# Patient Record
Sex: Female | Born: 1985
Health system: Southern US, Community
[De-identification: ages and names within clinical notes are randomized; demographics above are authoritative.]

## PROBLEM LIST (undated history)

## (undated) DIAGNOSIS — Z91013 Allergy to seafood: Secondary | ICD-10-CM

## (undated) DIAGNOSIS — E669 Obesity, unspecified: Secondary | ICD-10-CM

## (undated) DIAGNOSIS — M25472 Effusion, left ankle: Secondary | ICD-10-CM

## (undated) DIAGNOSIS — F419 Anxiety disorder, unspecified: Secondary | ICD-10-CM

## (undated) DIAGNOSIS — K760 Fatty (change of) liver, not elsewhere classified: Secondary | ICD-10-CM

## (undated) DIAGNOSIS — M25471 Effusion, right ankle: Secondary | ICD-10-CM

## (undated) DIAGNOSIS — R7303 Prediabetes: Secondary | ICD-10-CM

## (undated) DIAGNOSIS — R079 Chest pain, unspecified: Secondary | ICD-10-CM

## (undated) DIAGNOSIS — M2550 Pain in unspecified joint: Secondary | ICD-10-CM

## (undated) DIAGNOSIS — K828 Other specified diseases of gallbladder: Secondary | ICD-10-CM

## (undated) DIAGNOSIS — K829 Disease of gallbladder, unspecified: Secondary | ICD-10-CM

## (undated) DIAGNOSIS — E559 Vitamin D deficiency, unspecified: Secondary | ICD-10-CM

## (undated) DIAGNOSIS — M25475 Effusion, left foot: Secondary | ICD-10-CM

## (undated) DIAGNOSIS — M25474 Effusion, right foot: Secondary | ICD-10-CM

## (undated) DIAGNOSIS — R5383 Other fatigue: Secondary | ICD-10-CM

## (undated) DIAGNOSIS — R0602 Shortness of breath: Secondary | ICD-10-CM

## (undated) DIAGNOSIS — D649 Anemia, unspecified: Secondary | ICD-10-CM

## (undated) DIAGNOSIS — G4730 Sleep apnea, unspecified: Secondary | ICD-10-CM

## (undated) DIAGNOSIS — E785 Hyperlipidemia, unspecified: Secondary | ICD-10-CM

## (undated) DIAGNOSIS — M1990 Unspecified osteoarthritis, unspecified site: Secondary | ICD-10-CM

## (undated) DIAGNOSIS — R1310 Dysphagia, unspecified: Secondary | ICD-10-CM

## (undated) DIAGNOSIS — F32A Depression, unspecified: Secondary | ICD-10-CM

## (undated) HISTORY — DX: Shortness of breath: R06.02

## (undated) HISTORY — DX: Sleep apnea, unspecified: G47.30

## (undated) HISTORY — DX: Effusion, left ankle: M25.472

## (undated) HISTORY — DX: Effusion, left foot: M25.475

## (undated) HISTORY — DX: Anxiety disorder, unspecified: F41.9

## (undated) HISTORY — DX: Allergy to seafood: Z91.013

## (undated) HISTORY — DX: Vitamin D deficiency, unspecified: E55.9

## (undated) HISTORY — DX: Chest pain, unspecified: R07.9

## (undated) HISTORY — DX: Depression, unspecified: F32.A

## (undated) HISTORY — DX: Effusion, right ankle: M25.471

## (undated) HISTORY — DX: Fatty (change of) liver, not elsewhere classified: K76.0

## (undated) HISTORY — DX: Disease of gallbladder, unspecified: K82.9

## (undated) HISTORY — DX: Dysphagia, unspecified: R13.10

## (undated) HISTORY — PX: WISDOM TOOTH EXTRACTION: SHX21

## (undated) HISTORY — DX: Other fatigue: R53.83

## (undated) HISTORY — DX: Anemia, unspecified: D64.9

## (undated) HISTORY — DX: Hyperlipidemia, unspecified: E78.5

## (undated) HISTORY — DX: Pain in unspecified joint: M25.50

## (undated) HISTORY — DX: Prediabetes: R73.03

## (undated) HISTORY — DX: Obesity, unspecified: E66.9

## (undated) HISTORY — DX: Effusion, right foot: M25.474

---
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
## 2007-01-09 ENCOUNTER — Ambulatory Visit: Payer: Self-pay | Admitting: Internal Medicine

## 2007-01-30 ENCOUNTER — Ambulatory Visit: Payer: Self-pay | Admitting: Family Medicine

## 2007-04-09 ENCOUNTER — Ambulatory Visit: Payer: Self-pay | Admitting: Family Medicine

## 2007-06-03 ENCOUNTER — Emergency Department (HOSPITAL_COMMUNITY): Admission: EM | Admit: 2007-06-03 | Discharge: 2007-06-03 | Payer: Self-pay | Admitting: Family Medicine

## 2007-07-15 ENCOUNTER — Ambulatory Visit: Payer: Self-pay | Admitting: Family Medicine

## 2007-09-06 ENCOUNTER — Emergency Department (HOSPITAL_COMMUNITY): Admission: EM | Admit: 2007-09-06 | Discharge: 2007-09-06 | Payer: Self-pay | Admitting: Family Medicine

## 2007-10-19 ENCOUNTER — Emergency Department (HOSPITAL_COMMUNITY): Admission: EM | Admit: 2007-10-19 | Discharge: 2007-10-20 | Payer: Self-pay | Admitting: Emergency Medicine

## 2008-01-23 ENCOUNTER — Emergency Department (HOSPITAL_COMMUNITY): Admission: EM | Admit: 2008-01-23 | Discharge: 2008-01-24 | Payer: Self-pay | Admitting: Emergency Medicine

## 2008-07-03 ENCOUNTER — Ambulatory Visit: Payer: Self-pay | Admitting: Family Medicine

## 2008-07-25 ENCOUNTER — Emergency Department (HOSPITAL_COMMUNITY): Admission: EM | Admit: 2008-07-25 | Discharge: 2008-07-25 | Payer: Self-pay | Admitting: Emergency Medicine

## 2008-08-03 ENCOUNTER — Inpatient Hospital Stay (HOSPITAL_COMMUNITY): Admission: AD | Admit: 2008-08-03 | Discharge: 2008-08-03 | Payer: Self-pay | Admitting: Obstetrics and Gynecology

## 2009-04-02 ENCOUNTER — Ambulatory Visit: Payer: Self-pay | Admitting: Family Medicine

## 2009-04-05 ENCOUNTER — Emergency Department (HOSPITAL_COMMUNITY): Admission: EM | Admit: 2009-04-05 | Discharge: 2009-04-05 | Payer: Self-pay | Admitting: Emergency Medicine

## 2009-08-04 ENCOUNTER — Ambulatory Visit: Payer: Self-pay | Admitting: Family Medicine

## 2009-08-19 ENCOUNTER — Ambulatory Visit: Payer: Self-pay | Admitting: Family Medicine

## 2009-11-01 ENCOUNTER — Ambulatory Visit: Payer: Self-pay | Admitting: Family Medicine

## 2009-11-20 ENCOUNTER — Emergency Department (HOSPITAL_COMMUNITY): Admission: EM | Admit: 2009-11-20 | Discharge: 2009-11-20 | Payer: Self-pay | Admitting: Family Medicine

## 2009-12-12 ENCOUNTER — Emergency Department (HOSPITAL_COMMUNITY): Admission: EM | Admit: 2009-12-12 | Discharge: 2009-12-12 | Payer: Self-pay | Admitting: Family Medicine

## 2009-12-15 ENCOUNTER — Ambulatory Visit: Payer: Self-pay | Admitting: Family Medicine

## 2010-07-10 ENCOUNTER — Encounter: Payer: Self-pay | Admitting: Obstetrics and Gynecology

## 2010-09-22 LAB — RPR: RPR Ser Ql: NONREACTIVE

## 2010-09-22 LAB — POCT URINALYSIS DIP (DEVICE)
Bilirubin Urine: NEGATIVE
Glucose, UA: NEGATIVE mg/dL
Specific Gravity, Urine: 1.025 (ref 1.005–1.030)
Urobilinogen, UA: 0.2 mg/dL (ref 0.0–1.0)

## 2010-09-22 LAB — POCT PREGNANCY, URINE: Preg Test, Ur: NEGATIVE

## 2010-09-22 LAB — GC/CHLAMYDIA PROBE AMP, GENITAL: Chlamydia, DNA Probe: NEGATIVE

## 2010-09-22 LAB — HIV ANTIBODY (ROUTINE TESTING W REFLEX): HIV: NONREACTIVE

## 2010-09-22 LAB — WET PREP, GENITAL: Clue Cells Wet Prep HPF POC: NONE SEEN

## 2010-09-26 ENCOUNTER — Ambulatory Visit (INDEPENDENT_AMBULATORY_CARE_PROVIDER_SITE_OTHER): Payer: Managed Care, Other (non HMO) | Admitting: Family Medicine

## 2010-09-26 DIAGNOSIS — Z3201 Encounter for pregnancy test, result positive: Secondary | ICD-10-CM

## 2010-10-04 LAB — URINALYSIS, ROUTINE W REFLEX MICROSCOPIC
Bilirubin Urine: NEGATIVE
Glucose, UA: NEGATIVE mg/dL
Hgb urine dipstick: NEGATIVE
Ketones, ur: NEGATIVE mg/dL
Nitrite: NEGATIVE
Protein, ur: NEGATIVE mg/dL
pH: 6 (ref 5.0–8.0)

## 2010-10-04 LAB — WET PREP, GENITAL: Trich, Wet Prep: NONE SEEN

## 2010-10-04 LAB — POCT URINALYSIS DIP (DEVICE)
Bilirubin Urine: NEGATIVE
Glucose, UA: NEGATIVE mg/dL
Ketones, ur: NEGATIVE mg/dL
Specific Gravity, Urine: 1.02 (ref 1.005–1.030)

## 2010-10-04 LAB — POCT PREGNANCY, URINE
Preg Test, Ur: NEGATIVE
Preg Test, Ur: NEGATIVE

## 2010-10-12 ENCOUNTER — Inpatient Hospital Stay (HOSPITAL_COMMUNITY)
Admission: AD | Admit: 2010-10-12 | Discharge: 2010-10-12 | Disposition: A | Discharge status: Home / Self Care | Payer: Managed Care, Other (non HMO) | Source: Ambulatory Visit | Attending: Obstetrics and Gynecology | Admitting: Obstetrics and Gynecology

## 2010-10-12 ENCOUNTER — Inpatient Hospital Stay (HOSPITAL_COMMUNITY): Payer: Managed Care, Other (non HMO)

## 2010-10-12 DIAGNOSIS — O99891 Other specified diseases and conditions complicating pregnancy: Secondary | ICD-10-CM | POA: Insufficient documentation

## 2010-10-12 DIAGNOSIS — O9989 Other specified diseases and conditions complicating pregnancy, childbirth and the puerperium: Secondary | ICD-10-CM

## 2010-10-12 DIAGNOSIS — R42 Dizziness and giddiness: Secondary | ICD-10-CM

## 2010-10-12 LAB — CBC
HCT: 36.1 % (ref 36.0–46.0)
Hemoglobin: 12 g/dL (ref 12.0–15.0)
MCHC: 33.2 g/dL (ref 30.0–36.0)
MCV: 84.3 fL (ref 78.0–100.0)
Platelets: 262 10*3/uL (ref 150–400)
RBC: 4.28 MIL/uL (ref 3.87–5.11)
RDW: 13.4 % (ref 11.5–15.5)
WBC: 10.6 10*3/uL — ABNORMAL HIGH (ref 4.0–10.5)

## 2010-10-12 LAB — COMPREHENSIVE METABOLIC PANEL
Albumin: 3.7 g/dL (ref 3.5–5.2)
Alkaline Phosphatase: 42 U/L (ref 39–117)
CO2: 22 mEq/L (ref 19–32)
Chloride: 105 mEq/L (ref 96–112)
GFR calc Af Amer: >60 mL/min (ref >60)
GFR calc non Af Amer: >60 mL/min (ref >60)
Glucose, Bld: 91 mg/dL (ref 70–99)
Potassium: 3.9 mEq/L (ref 3.5–5.1)
Sodium: 135 mEq/L (ref 135–145)
Total Protein: 6.7 g/dL (ref 6.0–8.3)

## 2010-10-12 LAB — HCG, QUANTITATIVE, PREGNANCY: hCG, Beta Chain, Quant, S: 55464 m[IU]/mL — ABNORMAL HIGH (ref <5)

## 2010-10-12 LAB — ABO/RH: ABO/RH(D): O POS

## 2010-10-12 LAB — URINALYSIS, ROUTINE W REFLEX MICROSCOPIC: Glucose, UA: NEGATIVE mg/dL

## 2010-10-13 ENCOUNTER — Ambulatory Visit: Payer: Managed Care, Other (non HMO) | Admitting: Family Medicine

## 2010-10-14 ENCOUNTER — Ambulatory Visit: Payer: Managed Care, Other (non HMO) | Admitting: Medical

## 2010-10-14 LAB — RPR: RPR: NONREACTIVE

## 2010-10-14 LAB — GC/CHLAMYDIA PROBE AMP, GENITAL: Chlamydia: NEGATIVE

## 2010-10-14 LAB — RUBELLA ANTIBODY, IGM: Rubella: IMMUNE

## 2010-10-14 LAB — HEPATITIS B SURFACE ANTIGEN: Hepatitis B Surface Ag: NEGATIVE

## 2010-11-01 ENCOUNTER — Ambulatory Visit: Payer: Managed Care, Other (non HMO) | Admitting: Family Medicine

## 2010-11-02 ENCOUNTER — Ambulatory Visit: Payer: Managed Care, Other (non HMO) | Admitting: Family Medicine

## 2011-03-24 LAB — WET PREP, GENITAL
Trich, Wet Prep: NONE SEEN
WBC, Wet Prep HPF POC: NONE SEEN

## 2011-03-24 LAB — GC/CHLAMYDIA PROBE AMP, GENITAL: Chlamydia, DNA Probe: NEGATIVE

## 2011-03-24 LAB — POCT URINALYSIS DIP (DEVICE)
Bilirubin Urine: NEGATIVE
Glucose, UA: NEGATIVE
Hgb urine dipstick: NEGATIVE
Specific Gravity, Urine: 1.025
Urobilinogen, UA: 0.2
pH: 6

## 2011-03-24 LAB — POCT PREGNANCY, URINE: Preg Test, Ur: NEGATIVE

## 2011-03-24 LAB — RPR: RPR Ser Ql: NONREACTIVE

## 2011-03-26 ENCOUNTER — Encounter (HOSPITAL_COMMUNITY): Payer: Self-pay

## 2011-03-26 ENCOUNTER — Inpatient Hospital Stay (HOSPITAL_COMMUNITY)
Admission: AD | Admit: 2011-03-26 | Discharge: 2011-03-26 | Disposition: A | Discharge status: Home / Self Care | Payer: Managed Care, Other (non HMO) | Source: Ambulatory Visit | Attending: Obstetrics and Gynecology | Admitting: Obstetrics and Gynecology

## 2011-03-26 DIAGNOSIS — O99891 Other specified diseases and conditions complicating pregnancy: Secondary | ICD-10-CM | POA: Insufficient documentation

## 2011-03-26 DIAGNOSIS — R109 Unspecified abdominal pain: Secondary | ICD-10-CM | POA: Insufficient documentation

## 2011-03-26 DIAGNOSIS — Z3480 Encounter for supervision of other normal pregnancy, unspecified trimester: Secondary | ICD-10-CM

## 2011-03-26 DIAGNOSIS — Z3490 Encounter for supervision of normal pregnancy, unspecified, unspecified trimester: Secondary | ICD-10-CM

## 2011-03-26 LAB — URINALYSIS, ROUTINE W REFLEX MICROSCOPIC
Bilirubin Urine: NEGATIVE
Hgb urine dipstick: NEGATIVE
Nitrite: NEGATIVE
Protein, ur: NEGATIVE mg/dL
Urobilinogen, UA: 0.2 mg/dL (ref 0.0–1.0)

## 2011-03-26 NOTE — Progress Notes (Signed)
Having cramping since this morning, a lot of fetal movement, sneezed had water going down, looked clear.

## 2011-03-26 NOTE — ED Provider Notes (Signed)
History     Chief Complaint  Patient presents with  . Abdominal Cramping  . Vaginal Discharge   HPI G1P0 at [redacted]w[redacted]d reports small gush of clear fluid 23 earlier today when sneezing, no leaking since. Cramping earlier this morning, no pains or cramping now. + fetal movement.  Normal prenatal course.   OB History    Grav Para Term Preterm Abortions TAB SAB Ect Mult Living   1               Past Medical History  Diagnosis Date  . No pertinent past medical history     Past Surgical History  Procedure Date  . No past surgeries     No family history on file.  History  Substance Use Topics  . Smoking status: Never Smoker   . Smokeless tobacco: Not on file  . Alcohol Use: No    Allergies: Allergies not on file  No prescriptions prior to admission    Review of Systems  Constitutional: Negative.   Respiratory: Negative.   Cardiovascular: Negative.   Gastrointestinal: Negative for nausea, vomiting, abdominal pain, diarrhea and constipation.  Genitourinary: Negative for dysuria, urgency, frequency, hematuria and flank pain.       Negative for vaginal bleeding, cramping/contractions  Musculoskeletal: Negative.   Neurological: Negative.   Psychiatric/Behavioral: Negative.    Physical Exam   Blood pressure 131/73, pulse 120, temperature 98.9 F (37.2 C), temperature source Oral, resp. rate 18, height 5' 7.5" (1.715 m), weight 111.403 kg (245 lb 9.6 oz).  Physical Exam  Nursing note and vitals reviewed. Constitutional: She is oriented to person, place, and time. She appears well-developed and well-nourished. No distress.  Cardiovascular: Tachycardia present.   Respiratory: Effort normal.  GI: Soft. There is no tenderness.  Genitourinary: Vaginal discharge (white) found.       Cervix appears thick and closed   Musculoskeletal: Normal range of motion.  Neurological: She is oriented to person, place, and time.  Skin: Skin is warm and dry.  Psychiatric: She has a normal  mood and affect.   EFM: Baseline:140 Variability:mod Accels:present Decels:absent  Toco:quiet   MAU Course  Procedures  Results for orders placed during the hospital encounter of 03/26/11 (from the past 24 hour(s))  URINALYSIS, ROUTINE W REFLEX MICROSCOPIC     Status: Normal   Collection Time   03/26/11  3:00 PM      Component Value Range   Color, Urine YELLOW  YELLOW    Appearance CLEAR  CLEAR    Specific Gravity, Urine 1.020  1.005 - 1.030    pH 6.5  5.0 - 8.0    Glucose, UA NEGATIVE  NEGATIVE (mg/dL)   Hgb urine dipstick NEGATIVE  NEGATIVE    Bilirubin Urine NEGATIVE  NEGATIVE    Ketones, ur NEGATIVE  NEGATIVE (mg/dL)   Protein, ur NEGATIVE  NEGATIVE (mg/dL)   Urobilinogen, UA 0.2  0.0 - 1.0 (mg/dL)   Nitrite NEGATIVE  NEGATIVE    Leukocytes, UA NEGATIVE  NEGATIVE     Fern negative  Assessment and Plan  25 y.o. G1P0 at [redacted]w[redacted]d Intact membranes, reactive tracing Reported to Dr. Jackelyn Knife by RN, ok to D/C home with precautions Follow up as scheduled  FRAZIER,NATALIE 03/26/2011, 3:30 PM

## 2011-03-27 NOTE — Progress Notes (Signed)
FHT from 10-7 reviewed, reactive NST, no decels, rare ctx.

## 2011-04-27 LAB — STREP B DNA PROBE: GBS: NEGATIVE

## 2011-05-17 ENCOUNTER — Encounter (HOSPITAL_COMMUNITY): Payer: Self-pay | Admitting: Anesthesiology

## 2011-05-17 ENCOUNTER — Inpatient Hospital Stay (HOSPITAL_COMMUNITY): Payer: Managed Care, Other (non HMO) | Admitting: Anesthesiology

## 2011-05-17 ENCOUNTER — Inpatient Hospital Stay (HOSPITAL_COMMUNITY)
Admission: AD | Admit: 2011-05-17 | Discharge: 2011-05-20 | DRG: 775 | Disposition: A | Discharge status: Home / Self Care | Payer: Managed Care, Other (non HMO) | Source: Ambulatory Visit | Attending: Obstetrics and Gynecology | Admitting: Obstetrics and Gynecology

## 2011-05-17 ENCOUNTER — Encounter (HOSPITAL_COMMUNITY): Payer: Self-pay

## 2011-05-17 DIAGNOSIS — O4290 Premature rupture of membranes, unspecified as to length of time between rupture and onset of labor, unspecified weeks of gestation: Secondary | ICD-10-CM | POA: Diagnosis present

## 2011-05-17 LAB — CBC
MCH: 28.5 pg (ref 26.0–34.0)
MCHC: 32.7 g/dL (ref 30.0–36.0)
Platelets: 203 10*3/uL (ref 150–400)
RBC: 3.83 MIL/uL — ABNORMAL LOW (ref 3.87–5.11)

## 2011-05-17 MED ORDER — BUTORPHANOL TARTRATE 2 MG/ML IJ SOLN
1.0000 mg | Freq: Once | INTRAMUSCULAR | Status: AC
  Administered 2011-05-17: 1 mg via INTRAVENOUS
  Filled 2011-05-17: qty 1

## 2011-05-17 MED ORDER — SODIUM BICARBONATE 8.4 % IV SOLN
INTRAVENOUS | Status: DC | PRN
  Administered 2011-05-17: 5 mL via EPIDURAL

## 2011-05-17 MED ORDER — CITRIC ACID-SODIUM CITRATE 334-500 MG/5ML PO SOLN: 30.0000 mL | ORAL | Status: DC | PRN

## 2011-05-17 MED ORDER — OXYTOCIN BOLUS FROM INFUSION
500.0000 mL | Freq: Once | INTRAVENOUS | Status: DC
Start: 2011-05-17 — End: 2011-05-20
  Filled 2011-05-17: qty 500

## 2011-05-17 MED ORDER — LIDOCAINE HCL (PF) 1 % IJ SOLN
30.0000 mL | INTRAMUSCULAR | Status: DC | PRN
  Administered 2011-05-18: 30 mL via SUBCUTANEOUS
  Filled 2011-05-17: qty 30

## 2011-05-17 MED ORDER — FENTANYL 2.5 MCG/ML BUPIVACAINE 1/10 % EPIDURAL INFUSION (WH - ANES)
14.0000 mL/h | INTRAMUSCULAR | Status: DC
  Administered 2011-05-17: 14 mL/h via EPIDURAL
  Filled 2011-05-17 (×2): qty 60

## 2011-05-17 MED ORDER — LACTATED RINGERS IV SOLN
500.0000 mL | INTRAVENOUS | Status: DC | PRN
  Administered 2011-05-17: 500 mL via INTRAVENOUS

## 2011-05-17 MED ORDER — OXYCODONE-ACETAMINOPHEN 5-325 MG PO TABS: 2.0000 | ORAL_TABLET | ORAL | Status: DC | PRN

## 2011-05-17 MED ORDER — FLEET ENEMA 7-19 GM/118ML RE ENEM: 1.0000 | ENEMA | RECTAL | Status: DC | PRN

## 2011-05-17 MED ORDER — ONDANSETRON HCL 4 MG/2ML IJ SOLN: 4.0000 mg | Freq: Four times a day (QID) | INTRAMUSCULAR | Status: DC | PRN

## 2011-05-17 MED ORDER — PHENYLEPHRINE 40 MCG/ML (10ML) SYRINGE FOR IV PUSH (FOR BLOOD PRESSURE SUPPORT)
80.0000 ug | PREFILLED_SYRINGE | INTRAVENOUS | Status: DC | PRN
  Filled 2011-05-17: qty 5

## 2011-05-17 MED ORDER — FENTANYL 2.5 MCG/ML BUPIVACAINE 1/10 % EPIDURAL INFUSION (WH - ANES)
INTRAMUSCULAR | Status: DC | PRN
  Administered 2011-05-17: 14 mL/h via EPIDURAL

## 2011-05-17 MED ORDER — IBUPROFEN 600 MG PO TABS
600.0000 mg | ORAL_TABLET | Freq: Four times a day (QID) | ORAL | Status: DC | PRN
  Administered 2011-05-18: 600 mg via ORAL
  Filled 2011-05-17: qty 1

## 2011-05-17 MED ORDER — OXYTOCIN 20 UNITS IN LACTATED RINGERS INFUSION - SIMPLE
125.0000 mL/h | Freq: Once | INTRAVENOUS | Status: AC
  Administered 2011-05-17: 125 mL/h via INTRAVENOUS
  Administered 2011-05-17: 3 mL/h via INTRAVENOUS
  Filled 2011-05-17 (×2): qty 1000

## 2011-05-17 MED ORDER — EPHEDRINE 5 MG/ML INJ
10.0000 mg | INTRAVENOUS | Status: DC | PRN
  Filled 2011-05-17: qty 4

## 2011-05-17 MED ORDER — EPHEDRINE 5 MG/ML INJ: 10.0000 mg | INTRAVENOUS | Status: DC | PRN

## 2011-05-17 MED ORDER — OXYTOCIN 20 UNITS IN LACTATED RINGERS INFUSION - SIMPLE: 1.0000 m[IU]/min | INTRAVENOUS | Status: DC

## 2011-05-17 MED ORDER — LACTATED RINGERS IV SOLN
INTRAVENOUS | Status: DC
  Administered 2011-05-17: 16:00:00 via INTRAVENOUS

## 2011-05-17 MED ORDER — ACETAMINOPHEN 325 MG PO TABS: 650.0000 mg | ORAL_TABLET | ORAL | Status: DC | PRN

## 2011-05-17 MED ORDER — DIPHENHYDRAMINE HCL 50 MG/ML IJ SOLN: 12.5000 mg | INTRAMUSCULAR | Status: DC | PRN

## 2011-05-17 MED ORDER — PHENYLEPHRINE 40 MCG/ML (10ML) SYRINGE FOR IV PUSH (FOR BLOOD PRESSURE SUPPORT): 80.0000 ug | PREFILLED_SYRINGE | INTRAVENOUS | Status: DC | PRN

## 2011-05-17 MED ORDER — LACTATED RINGERS IV SOLN
500.0000 mL | Freq: Once | INTRAVENOUS | Status: AC
  Administered 2011-05-17: 500 mL via INTRAVENOUS

## 2011-05-17 NOTE — Anesthesia Preprocedure Evaluation (Signed)
Anesthesia Evaluation  Patient identified by MRN, date of birth, ID band Patient awake    Reviewed: Allergy & Precautions, H&P , Patient's Chart, lab work & pertinent test results  Airway Mallampati: II TM Distance: >3 FB Neck ROM: full    Dental  (+) Teeth Intact   Pulmonary  clear to auscultation        Cardiovascular regular Normal    Neuro/Psych    GI/Hepatic   Endo/Other  Morbid obesity  Renal/GU      Musculoskeletal   Abdominal   Peds  Hematology   Anesthesia Other Findings       Reproductive/Obstetrics (+) Pregnancy                          Anesthesia Physical Anesthesia Plan  ASA: III  Anesthesia Plan: Epidural   Post-op Pain Management:    Induction:   Airway Management Planned:   Additional Equipment:   Intra-op Plan:   Post-operative Plan:   Informed Consent:   Plan Discussed with:   Anesthesia Plan Comments:         Anesthesia Quick Evaluation  

## 2011-05-17 NOTE — Progress Notes (Signed)
Patient states she started leaking clear fluid at 1100 this am. Was seen in the office and confirmed SROM and sent to [facility]. Patient states contractions every 15 minutes and has not felt fetal movement today. Fetal heart tones in triage in the 120's.

## 2011-05-17 NOTE — Anesthesia Procedure Notes (Signed)
Epidural Patient location during procedure: OB  Preanesthetic Checklist Completed: patient identified, site marked, surgical consent, pre-op evaluation, timeout performed, IV checked, risks and benefits discussed and monitors and equipment checked  Epidural Patient position: sitting Prep: site prepped and draped and DuraPrep Patient monitoring: continuous pulse ox and blood pressure Approach: midline Injection technique: LOR air  Needle:  Needle type: Tuohy  Needle gauge: 17 G Needle length: 9 cm Needle insertion depth: 7 cm Catheter type: closed end flexible Catheter size: 19 Gauge Catheter at skin depth: 14 cm Test dose: negative  Assessment Events: blood not aspirated, injection not painful, no injection resistance, negative IV test and no paresthesia  Additional Notes Dosing of Epidural:  1st dose, through needle ............................................. epi 1:200K + Xylocaine 40 mg  2nd dose, through catheter, after waiting 3 minutes.....epi 1:200K + Xylocaine 60 mg  3rd dose, through catheter after waiting 3 minutes .............................Marcaine   5mg   ( mg Marcaine are expressed as equivilent  cc's medication removed from the 0.1%Bupiv / fentanyl syringe from L&D pump)  ( 2% Xylo charted as a single dose in Epic Meds for ease of charting; actual dosing was fractionated as above, for saftey's sake)  As each dose occurred, patient was free of IV sx; and patient exhibited no evidence of SA injection.  Patient is more comfortable after epidural dosed. Please see RN's note for documentation of vital signs,and FHR which are stable.       
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
## 2011-05-17 NOTE — Progress Notes (Signed)
Patient ID: Deborah Little, female   DOB: Aug 09, 1985, 25 y.o.   MRN: [id number] Pt has received an epidural and is comfortable. Cervix is 4 cm 100% effaced and the vertex is at -1 station.

## 2011-05-17 NOTE — Progress Notes (Signed)
MD made aware of pt progression: uterine contraction pattern, SVE, FHT tracing and Pitocin settings. Continue current POC. Will continue to monitor.

## 2011-05-17 NOTE — Progress Notes (Addendum)
MD made aware of pt progress: SVE, uterine contraction pattern, FHT and Pitocin settings.

## 2011-05-17 NOTE — Progress Notes (Signed)
MD made aware of pt progress: uterine contraction pattern, FHT, SVE  and Pitocin settings. Continue current POC. Will continue to monitor.

## 2011-05-18 ENCOUNTER — Encounter (HOSPITAL_COMMUNITY): Payer: Self-pay | Admitting: Pediatric Intensive Care

## 2011-05-18 ENCOUNTER — Other Ambulatory Visit: Payer: Self-pay | Admitting: Obstetrics and Gynecology

## 2011-05-18 LAB — CBC
HCT: 30 % — ABNORMAL LOW (ref 36.0–46.0)
Hemoglobin: 9.6 g/dL — ABNORMAL LOW (ref 12.0–15.0)
MCH: 27.8 pg (ref 26.0–34.0)
MCV: 87 fL (ref 78.0–100.0)
RBC: 3.45 MIL/uL — ABNORMAL LOW (ref 3.87–5.11)
WBC: 13.7 10*3/uL — ABNORMAL HIGH (ref 4.0–10.5)

## 2011-05-18 LAB — RPR: RPR Ser Ql: NONREACTIVE

## 2011-05-18 MED ORDER — WITCH HAZEL-GLYCERIN EX PADS
1.0000 "application " | MEDICATED_PAD | CUTANEOUS | Status: DC | PRN
  Administered 2011-05-18: 1 via TOPICAL

## 2011-05-18 MED ORDER — IBUPROFEN 600 MG PO TABS
600.0000 mg | ORAL_TABLET | Freq: Four times a day (QID) | ORAL | Status: DC
  Administered 2011-05-18 – 2011-05-20 (×7): 600 mg via ORAL
  Filled 2011-05-18 (×7): qty 1

## 2011-05-18 MED ORDER — SIMETHICONE 80 MG PO CHEW: 80.0000 mg | CHEWABLE_TABLET | ORAL | Status: DC | PRN

## 2011-05-18 MED ORDER — MENTHOL 3 MG MT LOZG
1.0000 | LOZENGE | OROMUCOSAL | Status: DC | PRN
  Administered 2011-05-18: 3 mg via ORAL
  Filled 2011-05-18 (×4): qty 9

## 2011-05-18 MED ORDER — ONDANSETRON HCL 4 MG/2ML IJ SOLN: 4.0000 mg | INTRAMUSCULAR | Status: DC | PRN

## 2011-05-18 MED ORDER — PRENATAL PLUS 27-1 MG PO TABS
1.0000 | ORAL_TABLET | Freq: Every day | ORAL | Status: DC
  Administered 2011-05-18 – 2011-05-19 (×2): 1 via ORAL
  Filled 2011-05-18 (×3): qty 1

## 2011-05-18 MED ORDER — LANOLIN HYDROUS EX OINT: TOPICAL_OINTMENT | CUTANEOUS | Status: DC | PRN

## 2011-05-18 MED ORDER — TETANUS-DIPHTH-ACELL PERTUSSIS 5-2.5-18.5 LF-MCG/0.5 IM SUSP
0.5000 mL | Freq: Once | INTRAMUSCULAR | Status: AC
  Administered 2011-05-19: 0.5 mL via INTRAMUSCULAR
  Filled 2011-05-18: qty 0.5

## 2011-05-18 MED ORDER — BENZOCAINE-MENTHOL 20-0.5 % EX AERO: 1.0000 "application " | INHALATION_SPRAY | CUTANEOUS | Status: DC | PRN

## 2011-05-18 MED ORDER — DIBUCAINE 1 % RE OINT: 1.0000 "application " | TOPICAL_OINTMENT | RECTAL | Status: DC | PRN

## 2011-05-18 MED ORDER — MEASLES, MUMPS & RUBELLA VAC ~~LOC~~ INJ
0.5000 mL | INJECTION | Freq: Once | SUBCUTANEOUS | Status: DC
  Filled 2011-05-18: qty 0.5

## 2011-05-18 MED ORDER — DIPHENHYDRAMINE HCL 25 MG PO CAPS: 25.0000 mg | ORAL_CAPSULE | Freq: Four times a day (QID) | ORAL | Status: DC | PRN

## 2011-05-18 MED ORDER — ZOLPIDEM TARTRATE 5 MG PO TABS: 5.0000 mg | ORAL_TABLET | Freq: Every evening | ORAL | Status: DC | PRN

## 2011-05-18 MED ORDER — OXYTOCIN 20 UNITS IN LACTATED RINGERS INFUSION - SIMPLE: 125.0000 mL/h | INTRAVENOUS | Status: AC

## 2011-05-18 MED ORDER — SENNOSIDES-DOCUSATE SODIUM 8.6-50 MG PO TABS
2.0000 | ORAL_TABLET | Freq: Every day | ORAL | Status: DC
  Administered 2011-05-18 – 2011-05-19 (×2): 2 via ORAL

## 2011-05-18 MED ORDER — GUAIFENESIN 100 MG/5ML PO SOLN
5.0000 mL | ORAL | Status: DC | PRN
  Administered 2011-05-18: 100 mg via ORAL
  Administered 2011-05-19: 300 mg via ORAL
  Filled 2011-05-18 (×2): qty 15

## 2011-05-18 MED ORDER — ONDANSETRON HCL 4 MG PO TABS: 4.0000 mg | ORAL_TABLET | ORAL | Status: DC | PRN

## 2011-05-18 NOTE — Op Note (Signed)
Pt progressed slowly to full dilatation and pushed well to deliver a living female infant spontaneously ROA over an intact perineum. Weight 7 pounds 7 ounces and Apgars 7 at 1 minute and 9 at 5 minutes. At 1 minute 1 taken off for tone and 2 for color. There was an occult cord prolapse at the time of delivery. The cord was at least 4 feet long and the placenta was removed intact. There were bilateral labial lacerations and a laceration at the introitus at 4 o'clock, all of which were bleeding and sutured with 3-0 vicryl. EBL 400 cc's. During the second stage of labor O2 was given by mask and lateral displacement of the uterus was done to improve bradycardia.

## 2011-05-18 NOTE — Progress Notes (Signed)
Md made aware of pt status: uterine contractions pattern, FHT tracing, SVE and Pitocin setting. Continue current POC. Will continue to monitor.

## 2011-05-18 NOTE — Addendum Note (Signed)
Addendum  created 05/18/11 1010 by Cephus Shelling   Modules edited:Charges VN, Notes Section
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
## 2011-05-18 NOTE — Progress Notes (Signed)
Pt delivered viable female with APGARS 7, 9. Dr Ambrose Mantle present at delivery.

## 2011-05-18 NOTE — Progress Notes (Signed)
MD made aware of pt status: FHT tracing, uterine contraction pattern, SVE and pitocin settings. Will keep current pitocin settings per MD. Will continue to monitor.
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
## 2011-05-18 NOTE — Progress Notes (Signed)
Patient ID: Deborah Little, female   DOB: 1985/09/14, 25 y.o.   MRN: [id number] DOD afebrile doing well

## 2011-05-18 NOTE — Anesthesia Postprocedure Evaluation (Signed)
  Anesthesia Post-op Note  Patient: Deborah Little  Procedure(s) Performed: * No procedures listed *  Patient Location: Mother/Baby  Anesthesia Type: Epidural  Level of Consciousness: alert  and oriented  Airway and Oxygen Therapy: Patient Spontanous Breathing  Post-op Pain: mild  Post-op Assessment: Patient's Cardiovascular Status Stable and Respiratory Function Stable  Post-op Vital Signs: stable  Complications: No apparent anesthesia complications

## 2011-05-18 NOTE — Progress Notes (Signed)
MD made aware of SVE.

## 2011-05-18 NOTE — Progress Notes (Signed)
Pt to room 138 via w/c in stable condition.

## 2011-05-19 MED ORDER — HYDROCODONE-ACETAMINOPHEN 5-325 MG PO TABS
1.0000 | ORAL_TABLET | ORAL | Status: DC | PRN
  Administered 2011-05-19: 1 via ORAL
  Filled 2011-05-19: qty 1

## 2011-05-19 NOTE — Progress Notes (Signed)
Patient ID: Deborah Little, female   DOB: 1985/07/20, 25 y.o.   MRN: [id number] #1 afebrile BP normal HGB 10.9 to 9.6. Still has not decided re: circumcision.
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
## 2011-05-20 MED ORDER — IBUPROFEN 600 MG PO TABS: 600.0000 mg | ORAL_TABLET | Freq: Four times a day (QID) | ORAL | Status: AC

## 2011-05-20 NOTE — Discharge Summary (Signed)
Obstetric Discharge Summary Reason for Admission: onset of labor Prenatal Procedures: none Intrapartum Procedures: spontaneous vaginal delivery Postpartum Procedures: none Complications-Operative and Postpartum: bilateral labial abrasions--repaired Hemoglobin  Date Value Range Status  05/18/2011 9.6* 12.0-15.0 (g/dL) Final     HCT  Date Value Range Status  05/18/2011 30.0* 36.0-46.0 (%) Final    Discharge Diagnoses: Term Pregnancy-delivered  Discharge Information: Date: 05/20/2011 Activity: pelvic rest Diet: routine Medications: Ibuprofen Condition: improved Instructions: refer to practice specific booklet Discharge to: home Follow-up Information    Follow up with [name], MD. Make an appointment in 6 weeks.   Contact information:   [employer] [employer]. [address] [place], [address] [place] [address] [phone]          Newborn Data: Live born female  Birth Weight: 7 lb 7.6 oz (3390 g) APGAR: 7, 9  Home with mother.  Oliver Pila 05/20/2011, 7:51 AM

## 2011-05-20 NOTE — Progress Notes (Signed)
Post Partum Day 1 Subjective: no complaints, voiding and tolerating PO  Objective: Blood pressure 106/79, pulse 105, temperature 97.3 F (36.3 C), temperature source Oral, resp. rate 18, height 5' 6.5" (1.689 m), weight 116.574 kg (257 lb), SpO2 99.00%, unknown if currently breastfeeding.  Physical Exam:  General: alert Lochia: appropriate Uterine Fundus: firm   Basename 05/18/11 0515 05/17/11 1702  HGB 9.6* 10.9*  HCT 30.0* 33.3*    Assessment/Plan: Discharge home F/u office in 6 weeks Plans circumcision in office--advised to call to set up in next few days Motrin   LOS: 3 days   Sheran Newstrom W 05/20/2011, 7:48 AM

## 2011-11-16 ENCOUNTER — Encounter (HOSPITAL_COMMUNITY): Payer: Self-pay | Admitting: Emergency Medicine

## 2011-11-16 ENCOUNTER — Emergency Department (HOSPITAL_COMMUNITY)
Admission: EM | Admit: 2011-11-16 | Discharge: 2011-11-16 | Disposition: A | Discharge status: Home / Self Care | Payer: Managed Care, Other (non HMO) | Attending: Emergency Medicine | Admitting: Emergency Medicine

## 2011-11-16 DIAGNOSIS — L0291 Cutaneous abscess, unspecified: Secondary | ICD-10-CM

## 2011-11-16 DIAGNOSIS — L02419 Cutaneous abscess of limb, unspecified: Secondary | ICD-10-CM | POA: Insufficient documentation

## 2011-11-16 MED ORDER — HYDROCODONE-ACETAMINOPHEN 5-325 MG PO TABS: 1.0000 | ORAL_TABLET | Freq: Four times a day (QID) | ORAL | Status: AC | PRN

## 2011-11-16 MED ORDER — CLINDAMYCIN HCL 300 MG PO CAPS: 300.0000 mg | ORAL_CAPSULE | Freq: Three times a day (TID) | ORAL | Status: AC

## 2011-11-16 MED ORDER — LIDOCAINE-EPINEPHRINE (PF) 2 %-1:200000 IJ SOLN: 10.0000 mL | Freq: Once | INTRAMUSCULAR | Status: DC

## 2011-11-16 NOTE — ED Notes (Signed)
Pt presenting to ed with c/o abscess to left thigh since yesterday that's hard with redness. Pt states no drainage at this time.

## 2011-11-16 NOTE — ED Provider Notes (Signed)
History     CSN: [id number]  Arrival date & time 11/16/11  1401   First MD Initiated Contact with Patient 11/16/11 1520      3:23 PM HPI   Patient is a 26 y.o. female presenting with abscess. The history is provided by the patient.  Abscess  This is a new problem. The current episode started yesterday. The onset was sudden. The problem occurs continuously. The problem has been rapidly worsening. The abscess is present on the left upper leg. The problem is severe. The abscess is characterized by painfulness and redness. It is unknown what she was exposed to. Pertinent negatives include no fever, no vomiting, no rhinorrhea and no cough. There were no sick contacts.    Past Medical History  Diagnosis Date  . No pertinent past medical history     Past Surgical History  Procedure Date  . No past surgeries     No family history on file.  History  Substance Use Topics  . Smoking status: Never Smoker   . Smokeless tobacco: Not on file  . Alcohol Use: No    OB History    Grav Para Term Preterm Abortions TAB SAB Ect Mult Living   1 1 1  0 0 0 0 0 0 1      Review of Systems  Constitutional: Negative for fever and chills.  HENT: Negative for rhinorrhea.   Eyes: Negative for redness.  Respiratory: Negative for cough, shortness of breath and wheezing.   Cardiovascular: Negative for chest pain and palpitations.  Gastrointestinal: Negative for nausea and vomiting.  Skin:       Abscess    Allergies  Hydrocodone  Home Medications  No current outpatient prescriptions on file.  BP 125/73  Pulse 106  Temp(Src) 97.8 F (36.6 C) (Oral)  Resp 18  SpO2 100%  Physical Exam  Vitals reviewed. Constitutional: She is oriented to person, place, and time. Vital signs are normal. She appears well-developed and well-nourished. No distress.  HENT:  Head: Normocephalic and atraumatic.  Eyes: Pupils are equal, round, and reactive to light.  Neck: Neck supple.  Pulmonary/Chest:  Effort normal.  Neurological: She is alert and oriented to person, place, and time.  Skin: Skin is warm and dry. No rash noted. No erythema. No pallor.       Left upper medial thigh has a large erythematous area with central induration and pustule. No fluctuance. TTP.   Psychiatric: She has a normal mood and affect. Her behavior is normal.    ED Course  Procedures  INCISION AND DRAINAGE Performed by: Thomasene Lot Consent: Verbal consent obtained. Risks and benefits: risks, benefits and alternatives were discussed Type: abscess  Body area: left medial thigh  Anesthesia: local infiltration  Local anesthetic: lidocaine 2% with epinephrine  Anesthetic total: 3 ml  Complexity: complex Blunt dissection to break up loculations  Drainage: purulent  Drainage amount: minimal  Packing material: 1/4 in iodoform gauze  Patient tolerance: Patient tolerated the procedure well with no immediate complications.    MDM    Discussed packing removal in 3 days. We'll place on clindamycin and Vicodin for pain. Advised followup with ED for worsening symptoms. Patient voices understanding and is ready for discharge      Thomasene Lot, Cordelia Poche 11/16/11 1609

## 2011-11-16 NOTE — Discharge Instructions (Signed)
Abscess An abscess (boil or furuncle) is an infected area that contains a collection of pus.  SYMPTOMS Signs and symptoms of an abscess include pain, tenderness, redness, or hardness. You may feel a moveable soft area under your skin. An abscess can occur anywhere in the body.  TREATMENT  A surgical cut (incision) may be made over your abscess to drain the pus. Gauze may be packed into the space or a drain may be looped through the abscess cavity (pocket). This provides a drain that will allow the cavity to heal from the inside outwards. The abscess may be painful for a few days, but should feel much better if it was drained.  Your abscess, if seen early, may not have localized and may not have been drained. If not, another appointment may be required if it does not get better on its own or with medications. HOME CARE INSTRUCTIONS   Only take over-the-counter or prescription medicines for pain, discomfort, or fever as directed by your caregiver.   Take your antibiotics as directed if they were prescribed. Finish them even if you start to feel better.   Keep the skin and clothes clean around your abscess.   If the abscess was drained, you will need to use gauze dressing to collect any draining pus. Dressings will typically need to be changed 3 or more times a day.   The infection may spread by skin contact with others. Avoid skin contact as much as possible.   Practice good hygiene. This includes regular hand washing, cover any draining skin lesions, and do not share personal care items.   If you participate in sports, do not share athletic equipment, towels, whirlpools, or personal care items. Shower after every practice or tournament.   If a draining area cannot be adequately covered:   Do not participate in sports.   Children should not participate in day care until the wound has healed or drainage stops.   If your caregiver has given you a follow-up appointment, it is very important  to keep that appointment. Not keeping the appointment could result in a much worse infection, chronic or permanent injury, pain, and disability. If there is any problem keeping the appointment, you must call back to this facility for assistance.  SEEK MEDICAL CARE IF:   You develop increased pain, swelling, redness, drainage, or bleeding in the wound site.   You develop signs of generalized infection including muscle aches, chills, fever, or a general ill feeling.   You have an oral temperature above 102 F (38.9 C).  MAKE SURE YOU:   Understand these instructions.   Will watch your condition.   Will get help right away if you are not doing well or get worse.  Document Released: 03/15/2005 Document Revised: 05/25/2011 Document Reviewed: 01/07/2008 [employer] Patient Information 2012 [employer].Abscess Care After An abscess (also called a boil or furuncle) is an infected area that contains a collection of pus. Signs and symptoms of an abscess include pain, tenderness, redness, or hardness, or you may feel a moveable soft area under your skin. An abscess can occur anywhere in the body. The infection may spread to surrounding tissues causing cellulitis. A cut (incision) by the surgeon was made over your abscess and the pus was drained out. Gauze may have been packed into the space to provide a drain that will allow the cavity to heal from the inside outwards. The boil may be painful for 5 to 7 days. Most people with a boil   do not have high fevers. Your abscess, if seen early, may not have localized, and may not have been lanced. If not, another appointment may be required for this if it does not get better on its own or with medications. HOME CARE INSTRUCTIONS   Only take over-the-counter or prescription medicines for pain, discomfort, or fever as directed by your caregiver.   When you bathe, soak and then remove gauze or iodoform packs at least daily or as directed by your caregiver. You may  then wash the wound gently with mild soapy water. Repack with gauze or do as your caregiver directs.  SEEK IMMEDIATE MEDICAL CARE IF:   You develop increased pain, swelling, redness, drainage, or bleeding in the wound site.   You develop signs of generalized infection including muscle aches, chills, fever, or a general ill feeling.   An oral temperature above 102 F (38.9 C) develops, not controlled by medication.  See your caregiver for a recheck if you develop any of the symptoms described above. If medications (antibiotics) were prescribed, take them as directed. Document Released: 12/22/2004 Document Revised: 05/25/2011 Document Reviewed: 08/19/2007 [employer] Patient Information 2012 [employer]. 

## 2011-11-23 NOTE — ED Provider Notes (Signed)
Medical screening examination/treatment/procedure(s) were performed by non-physician practitioner and as supervising physician I was immediately available for consultation/collaboration.  Torrance Stockley, MD 11/23/11 0239 

## 2012-02-20 ENCOUNTER — Inpatient Hospital Stay (HOSPITAL_COMMUNITY)
Admission: AD | Admit: 2012-02-20 | Discharge: 2012-02-20 | Discharge status: Against Medical Advice | Payer: Managed Care, Other (non HMO) | Source: Ambulatory Visit | Attending: Obstetrics and Gynecology | Admitting: Obstetrics and Gynecology

## 2012-02-20 NOTE — MAU Note (Signed)
Pt informed registration clerk that she was leaving without any explanation.

## 2012-04-28 ENCOUNTER — Other Ambulatory Visit: Payer: Self-pay | Admitting: Family Medicine

## 2012-05-11 ENCOUNTER — Encounter (HOSPITAL_COMMUNITY): Payer: Self-pay | Admitting: Emergency Medicine

## 2012-05-11 ENCOUNTER — Emergency Department (INDEPENDENT_AMBULATORY_CARE_PROVIDER_SITE_OTHER)
Admission: EM | Admit: 2012-05-11 | Discharge: 2012-05-11 | Disposition: A | Discharge status: Home / Self Care | Payer: Managed Care, Other (non HMO) | Source: Home / Self Care | Attending: Family Medicine | Admitting: Family Medicine

## 2012-05-11 DIAGNOSIS — Z041 Encounter for examination and observation following transport accident: Secondary | ICD-10-CM

## 2012-05-11 DIAGNOSIS — M542 Cervicalgia: Secondary | ICD-10-CM
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
## 2012-07-08 ENCOUNTER — Encounter: Payer: Self-pay | Admitting: Family Medicine

## 2012-07-08 ENCOUNTER — Other Ambulatory Visit: Payer: Self-pay

## 2012-07-08 ENCOUNTER — Ambulatory Visit (INDEPENDENT_AMBULATORY_CARE_PROVIDER_SITE_OTHER): Payer: Managed Care, Other (non HMO) | Admitting: Family Medicine

## 2012-07-08 VITALS — BP 120/80 | HR 87 | Wt 257.0 lb

## 2012-07-08 DIAGNOSIS — N649 Disorder of breast, unspecified: Secondary | ICD-10-CM

## 2012-07-08 MED ORDER — TRIAMCINOLONE ACETONIDE 0.5 % EX CREA: 1.0000 "application " | TOPICAL_CREAM | Freq: Three times a day (TID) | CUTANEOUS | Status: DC

## 2012-07-08 NOTE — Progress Notes (Signed)
  Subjective:    Patient ID: Deborah Little, female    DOB: [birth year], 27 y.o.   MRN: [id number]  HPI She has a lesion on the midportion of her upper left breast for the last several months. She has picked on several occasions. She's not been able to express any from it.   Review of Systems     Objective:   Physical Exam A 1.5 cm slightly discolored lesion with central scab noted. No induration is noted.       Assessment & Plan:   1. Skin lesion of breast    Recommend she leads a lesion alone and return here in roughly one month for me to reevaluate this without any trauma

## 2012-08-03 ENCOUNTER — Encounter (HOSPITAL_COMMUNITY): Payer: Self-pay | Admitting: *Deleted

## 2012-08-03 ENCOUNTER — Emergency Department (HOSPITAL_COMMUNITY)
Admission: EM | Admit: 2012-08-03 | Discharge: 2012-08-03 | Disposition: A | Discharge status: Home / Self Care | Payer: Managed Care, Other (non HMO) | Attending: Emergency Medicine | Admitting: Emergency Medicine

## 2012-08-03 DIAGNOSIS — L03116 Cellulitis of left lower limb: Secondary | ICD-10-CM

## 2012-08-03 DIAGNOSIS — L02419 Cutaneous abscess of limb, unspecified: Secondary | ICD-10-CM | POA: Insufficient documentation

## 2012-08-03 MED ORDER — CEPHALEXIN 500 MG PO CAPS: 500.0000 mg | ORAL_CAPSULE | Freq: Four times a day (QID) | ORAL | Status: DC

## 2012-08-03 NOTE — ED Notes (Signed)
Pt states she has some type of bite on her L upper leg, states x 2 days but has gotten worse w/ redness around site. Painful.

## 2012-08-03 NOTE — ED Provider Notes (Signed)
History     CSN: [id number]  Arrival date & time 08/03/12  1104   First MD Initiated Contact with Patient 08/03/12 1224      Chief Complaint  Patient presents with  . Insect Bite    (Consider location/radiation/quality/duration/timing/severity/associated sxs/prior treatment) HPI  27 year old female presents for evaluations of possible insect bite. Patient reports she noticed a small bite mark on the left upper thigh 2 days ago. She did not see any specific insect bite or recall any specific injury. She begins to develop gradual onset of pain to the affected site, nonradiating, with associated skin redness and warmth. There is mild to moderate in severity, with no associated fever, chills, nausea vomiting diarrhea, or abnormal discharge. Denies itchiness. No recent change in detergent, new pets, or new environmental changes. No specific treatment tried.   Past Medical History  Diagnosis Date  . No pertinent past medical history     Past Surgical History  Procedure Laterality Date  . No past surgeries      History reviewed. No pertinent family history.  History  Substance Use Topics  . Smoking status: Never Smoker   . Smokeless tobacco: Never Used  . Alcohol Use: No    OB History   Grav Para Term Preterm Abortions TAB SAB Ect Mult Living   1 1 1  0 0 0 0 0 0 1      Review of Systems  Constitutional:       10 Systems reviewed and all are negative for acute change except as noted in the HPI.   Skin: Positive for rash. Negative for wound.    Allergies  Hydrocodone  Home Medications   Current Outpatient Rx  Name  Route  Sig  Dispense  Refill  . triamcinolone cream (KENALOG) 0.5 %   Topical   Apply 1 application topically 3 (three) times daily.   30 g   11     BP 116/69  Pulse 107  Temp(Src) 98.8 F (37.1 C) (Oral)  Resp 18  SpO2 99%  LMP 07/27/2012  Physical Exam  Nursing note and vitals reviewed. Constitutional: She appears well-developed and  well-nourished. No distress.  HENT:  Head: Atraumatic.  Eyes: Conjunctivae are normal.  Neck: Neck supple.  Musculoskeletal:  L anterior upper thigh: a small punctated lesion with surround erythema measuring 4 inches in diameter without central clearing.  Associated wamth but no induration or fluctuance noted.  Non petechial, pustular, or vesicular lesions. ttp  Neurological: She is alert.  Skin: Skin is warm.  Psychiatric: She has a normal mood and affect.    ED Course  Procedures (including critical care time)  Labs Reviewed - No data to display No results found.   No diagnosis found.  12:47 PM  patient was seen by me for possible insect bite. She appears to have evidence of cellulitic changes without obvious evidence of abscess. Margin were drawn and dated.  Plan to discharge with Keflex, followup instruction, and site of management. Patient voiced understanding and agrees with plan.   BP 116/69  Pulse 107  Temp(Src) 98.8 F (37.1 C) (Oral)  Resp 18  SpO2 99%  LMP 07/27/2012  I have reviewed nursing notes and vital signs.   I reviewed available ER/hospitalization records thought the EMR  1. Cellulitis, L upper thigh MDM        Fayrene Helper, PA-C 08/03/12 1249

## 2012-08-03 NOTE — ED Provider Notes (Signed)
Medical screening examination/treatment/procedure(s) were performed by non-physician practitioner and as supervising physician I was immediately available for consultation/collaboration.   David H Yao, MD 08/03/12 1540 

## 2012-12-05 ENCOUNTER — Ambulatory Visit (INDEPENDENT_AMBULATORY_CARE_PROVIDER_SITE_OTHER): Payer: Managed Care, Other (non HMO) | Admitting: Medical

## 2012-12-05 ENCOUNTER — Encounter: Payer: Self-pay | Admitting: Medical

## 2012-12-05 ENCOUNTER — Telehealth: Payer: Self-pay | Admitting: Medical

## 2012-12-05 VITALS — BP 120/88 | HR 92 | Temp 98.1°F | Resp 16 | Wt 256.0 lb

## 2012-12-05 DIAGNOSIS — R51 Headache: Secondary | ICD-10-CM

## 2012-12-05 DIAGNOSIS — J309 Allergic rhinitis, unspecified: Secondary | ICD-10-CM

## 2012-12-05 DIAGNOSIS — H1010 Acute atopic conjunctivitis, unspecified eye: Secondary | ICD-10-CM

## 2012-12-05 DIAGNOSIS — H1013 Acute atopic conjunctivitis, bilateral: Secondary | ICD-10-CM

## 2012-12-05 MED ORDER — OLOPATADINE HCL 0.2 % OP SOLN: 1.0000 [drp] | Freq: Every day | OPHTHALMIC | Status: DC

## 2012-12-05 MED ORDER — FEXOFENADINE HCL 180 MG PO TABS: 180.0000 mg | ORAL_TABLET | Freq: Every day | ORAL | Status: DC

## 2012-12-05 NOTE — Telephone Encounter (Signed)
Pl's call pharmacist about other prescription allergy eye drop options

## 2012-12-05 NOTE — Progress Notes (Signed)
Subjective:  Deborah Little is a 27 y.o. female who presents for ongoing issues with eyes burning.  Has hx/o allergies, has allergy labs with her from 2008 testing, Dr. [place] Callas.  Lately been having a lot of eye burning, irritation, but no redness. Does occasionally get minimal crusting of eyes in the mornings. Has some head congestion.  Keeping some frontal headaches lately, frontal and right sided headaches.  No nausea, no vision changes, no hearing changes.   Pressure/tension like headache. But denies lots of runny nose, sneezing, rash, ear pain, sore throat.   She is currently using mucinex and Excedrin.  Doesn't feel like this is helping.  Last allergist visit few years ago.  Denies sick contacts.  No other aggravating or relieving factors.  No other c/o.  Past Medical History  Diagnosis Date  . No pertinent past medical history   . Environmental allergies     prior testing 2008;  Dr. [place] Callas   ROS as in subjective   Objective:  Filed Vitals:   12/05/12 1115  BP: 120/88  Pulse: 92  Temp: 98.1 F (36.7 C)  Resp: 16    General appearance: Alert, WD/WN, no distress                             Skin: warm, no rash                           Head: no sinus tenderness                            Eyes: conjunctiva normal, corneas clear, PERRLA                            Ears: pearly TMs, external ear canals normal                          Nose: septum midline, turbinates swollen, with clear discharge             Mouth/throat: MMM, tongue normal,no pharyngeal erythema                           Neck: supple, no adenopathy, no thyromegaly, nontender                          Heart: RRR, normal S1, S2, no murmurs                         Lungs: CTA bilaterally, no wheezes, rales, or rhonchi     Assessment and Plan: Encounter Diagnoses  Name Primary?  . Allergic conjunctivitis and rhinitis, bilateral Yes  . Allergic rhinitis   . Headache(784.0)    reviewed her numerous allergens per  testing.  Begin Pataday, Allegra, avoid triggers, shower daily, and keep headache diary.  F/u 4mo, sooner prn.

## 2012-12-05 NOTE — Telephone Encounter (Signed)
Patient is aware. CLS 

## 2012-12-05 NOTE — Patient Instructions (Addendum)
Begin Pataday eye drops once daily.  Begin Allegra allergy tablet at bedtime daily.  Take a shower daily to rinse off pollen and allergen triggers.  Start keeping a headache diary.    Recheck in 1 month.

## 2012-12-27 ENCOUNTER — Encounter: Payer: Self-pay | Admitting: Medical

## 2013-06-16 ENCOUNTER — Telehealth: Payer: Self-pay | Admitting: Family Medicine

## 2013-06-16 NOTE — Telephone Encounter (Signed)
Pt called for refill of her Triancinalone to the [facility] on [address].  She also wants to know if it is safe to use on her face?  Pt phone [phone]

## 2013-06-16 NOTE — Telephone Encounter (Signed)
Have her f/u OV, I didn't see specific diagnosis for the cream, and depending upon the rash, probably shouldn't be used on face

## 2013-06-17 ENCOUNTER — Other Ambulatory Visit: Payer: Self-pay | Admitting: Medical

## 2013-06-17 MED ORDER — TRIAMCINOLONE ACETONIDE 0.5 % EX CREA: 1.0000 "application " | TOPICAL_CREAM | Freq: Three times a day (TID) | CUTANEOUS | Status: DC

## 2013-06-17 NOTE — Telephone Encounter (Signed)
I refill the medication. For the record, this was last filled January of this year for short-term use by Dr. Susann Givens.  I have never prescribed this for her but after looking in her paper chart there are refills prior to her office going on the electronic records almost 3 years ago.  That's why it did not show up in the system.

## 2013-06-17 NOTE — Telephone Encounter (Signed)
Patient doesn't feel like she needs to come in for a OV every time she has a little spot of eczema. She states that she has had this for years and Dr. Susann Givens usually gives her a year supply.I pulled the patients chart and it is at your work station. CLS

## 2013-06-18 NOTE — Telephone Encounter (Signed)
I left the patient a voicemail about her medication refills. CLS

## 2013-07-28 ENCOUNTER — Other Ambulatory Visit: Payer: Self-pay | Admitting: Dermatology

## 2013-11-12 ENCOUNTER — Encounter (HOSPITAL_COMMUNITY): Payer: Self-pay | Admitting: Emergency Medicine

## 2013-11-12 ENCOUNTER — Emergency Department (INDEPENDENT_AMBULATORY_CARE_PROVIDER_SITE_OTHER)
Admission: EM | Admit: 2013-11-12 | Discharge: 2013-11-12 | Disposition: A | Discharge status: Home / Self Care | Payer: Managed Care, Other (non HMO) | Source: Home / Self Care | Attending: Family Medicine | Admitting: Family Medicine

## 2013-11-12 DIAGNOSIS — M79609 Pain in unspecified limb: Secondary | ICD-10-CM

## 2013-11-12 DIAGNOSIS — M79606 Pain in leg, unspecified: Secondary | ICD-10-CM

## 2013-11-12 LAB — POCT I-STAT, CHEM 8
BUN: 7 mg/dL (ref 6–23)
CALCIUM ION: 1.18 mmol/L (ref 1.12–1.23)
CHLORIDE: 104 meq/L (ref 96–112)
Creatinine, Ser: 0.9 mg/dL (ref 0.50–1.10)
Glucose, Bld: 96 mg/dL (ref 70–99)
HEMATOCRIT: 41 % (ref 36.0–46.0)
Hemoglobin: 13.9 g/dL (ref 12.0–15.0)
POTASSIUM: 3.7 meq/L (ref 3.7–5.3)
Sodium: 141 mEq/L (ref 137–147)
TCO2: 22 mmol/L (ref 0–100)

## 2013-11-12 LAB — CBC
HCT: 36.5 % (ref 36.0–46.0)
Hemoglobin: 12.1 g/dL (ref 12.0–15.0)
MCH: 28.7 pg (ref 26.0–34.0)
MCHC: 33.2 g/dL (ref 30.0–36.0)
MCV: 86.7 fL (ref 78.0–100.0)
PLATELETS: 250 10*3/uL (ref 150–400)
RBC: 4.21 MIL/uL (ref 3.87–5.11)
RDW: 13.8 % (ref 11.5–15.5)
WBC: 8.1 10*3/uL (ref 4.0–10.5)

## 2013-11-12 LAB — D-DIMER, QUANTITATIVE (NOT AT ARMC)

## 2013-11-12 MED ORDER — DICLOFENAC SODIUM 50 MG PO TBEC: 50.0000 mg | DELAYED_RELEASE_TABLET | Freq: Two times a day (BID) | ORAL | Status: DC | PRN

## 2013-11-12 NOTE — ED Notes (Signed)
Pt c/o right leg edema onset 1 week Pain increases w/pressure; ambulated well to exam room w/NAD Denies inj/trauma Alert w/no signs of acute distress.

## 2013-11-12 NOTE — Discharge Instructions (Signed)
Thank you for coming in today.  Follow up with Dr. Susann Givens.  Call or go to the emergency room if you get worse, have trouble breathing, have chest pains, or palpitations.  Wear and Tear Disorders of the Knee (Arthritis, Osteoarthritis) Everyone will experience wear and tear injuries (arthritis, osteoarthritis) of the knee. These are the changes we all get as we age. They come from the joint stress of daily living. The amount of cartilage damage in your knee and your symptoms determine if you need surgery. Mild problems require approximately two months recovery time. More severe problems take several months to recover. With mild problems, your surgeon may find worn and rough cartilage surfaces. With severe changes, your surgeon may find cartilage that has completely worn away and exposed the bone. Loose bodies of bone and cartilage, bone spurs (excess bone growth), and injuries to the menisci (cushions between the large bones of your leg) are also common. All of these problems can cause pain. For a mild wear and tear problem, rough cartilage may simply need to be shaved and smoothed. For more severe problems with areas of exposed bone, your surgeon may use an instrument for roughing up the bone surfaces to stimulate new cartilage growth. Loose bodies are usually removed. Torn menisci may be trimmed or repaired. ABOUT THE ARTHROSCOPIC PROCEDURE Arthroscopy is a surgical technique. It allows your orthopedic surgeon to diagnose and treat your knee injury with accuracy. The surgeon looks into your knee through a small scope. The scope is like a small (pencil-sized) telescope. Arthroscopy is less invasive than open knee surgery. You can expect a more rapid recovery. After the procedure, you will be moved to a recovery area until most of the effects of the medication have worn off. Your caregiver will discuss the test results with you. RECOVERY The severity of the arthritis and the type of procedure performed will  determine recovery time. Other important factors include age, physical condition, medical conditions, and the type of rehabilitation program. Strengthening your muscles after arthroscopy helps guarantee a better recovery. Follow your caregiver's instructions. Use crutches, rest, elevate, ice, and do knee exercises as instructed. Your caregivers will help you and instruct you with exercises and other physical therapy required to regain your mobility, muscle strength, and functioning following surgery. Only take over-the-counter or prescription medicines for pain, discomfort, or fever as directed by your caregiver.  SEEK MEDICAL CARE IF:   There is increased bleeding (more than a small spot) from the wound.  You notice redness, swelling, or increasing pain in the wound.  Pus is coming from wound.  You develop an unexplained oral temperature above 102 F (38.9 C) , or as your caregiver suggests.  You notice a foul smell coming from the wound or dressing.  You have severe pain with motion of the knee. SEEK IMMEDIATE MEDICAL CARE IF:   You develop a rash.  You have difficulty breathing.  You have any allergic problems. MAKE SURE YOU:   Understand these instructions.  Will watch your condition.  Will get help right away if you are not doing well or get worse. Document Released: 06/02/2000 Document Revised: 08/28/2011 Document Reviewed: 10/30/2007 [facility] Patient Information 2014 [place], [place].

## 2013-11-12 NOTE — ED Provider Notes (Signed)
Orba Laflair Rens is a 28 y.o. female who presents to Urgent Care today for right leg swelling and pressure for approximately one week. No cough, chest pain or shortness of breath. No injury or trauma. Symptoms are moderate. No personal or family history of DVT. Patient feels well otherwise.   Past Medical History  Diagnosis Date  . No pertinent past medical history   . Environmental allergies     prior testing 2008;  Dr. En[place] Callas   History  Substance Use Topics  . Smoking status: Never Smoker   . Smokeless tobacco: Never Used  . Alcohol Use: Yes   ROS as above Medications: No current facility-administered medications for this encounter.   Current Outpatient Prescriptions  Medication Sig Dispense Refill  . diclofenac (VOLTAREN) 50 MG EC tablet Take 1 tablet (50 mg total) by mouth 2 (two) times daily as needed.  60 tablet  0  . fexofenadine (ALLEGRA) 180 MG tablet Take 1 tablet (180 mg total) by mouth daily.  30 tablet  3  . Olopatadine HCl (PATADAY) 0.2 % SOLN Apply 1 drop to eye daily.  2.5 mL  3  . phentermine 37.5 MG capsule Take 37.5 mg by mouth every morning.      . triamcinolone cream (KENALOG) 0.5 % Apply 1 application topically 3 (three) times daily.  45 g  2    Exam:  BP 120/70  Pulse 93  Temp(Src) 99.2 F (37.3 C) (Oral)  Resp 18  SpO2 100% Gen: Well NAD HEENT: EOMI,  MMM Lungs: Normal work of breathing. CTABL Heart: RRR no MRG Abd: NABS, Soft. NT, ND Exts: Brisk capillary refill, warm and well perfused. Nonedematous bilateral lower extremities. Calves are nontender bilaterally. No palpable cords. Pulses intact distally bilateral extremities. Right knee:  well-appearing no effusion Range of motion without crepitations. Stable ligamentous exam. Fullness palpated posteriorly  Results for orders placed during the hospital encounter of 11/12/13 (from the past 24 hour(s))  CBC     Status: None   Collection Time    11/12/13  3:00 PM      Result Value Ref Range   WBC  8.1  4.0 - 10.5 K/uL   RBC 4.21  3.87 - 5.11 MIL/uL   Hemoglobin 12.1  12.0 - 15.0 g/dL   HCT 09.8  11.9 - 14.7 %   MCV 86.7  78.0 - 100.0 fL   MCH 28.7  26.0 - 34.0 pg   MCHC 33.2  30.0 - 36.0 g/dL   RDW 82.9  56.2 - 13.0 %   Platelets 250  150 - 400 K/uL  D-DIMER, QUANTITATIVE     Status: None   Collection Time    11/12/13  3:00 PM      Result Value Ref Range   D-Dimer, Quant <0.27  0.00 - 0.48 ug/mL-FEU  POCT I-STAT, CHEM 8     Status: None   Collection Time    11/12/13  3:13 PM      Result Value Ref Range   Sodium 141  137 - 147 mEq/L   Potassium 3.7  3.7 - 5.3 mEq/L   Chloride 104  96 - 112 mEq/L   BUN 7  6 - 23 mg/dL   Creatinine, Ser 8.65  0.50 - 1.10 mg/dL   Glucose, Bld 96  70 - 99 mg/dL   Calcium, Ion 7.84  6.96 - 1.23 mmol/L   TCO2 22  0 - 100 mmol/L   Hemoglobin 13.9  12.0 - 15.0 g/dL  HCT 41.0  36.0 - 46.0 %   No results found.  Assessment and Plan: 28 y.o. female with calf pain and swelling. I think this is most likely a Baker's cyst. Plan for treatment with diclofenac and followup with primary care provider.  Discussed warning signs or symptoms. Please see discharge instructions. Patient expresses understanding.    Rodolph BongEvan S Corey, MD 11/12/13 [id number]

## 2013-12-02 ENCOUNTER — Telehealth: Payer: Self-pay | Admitting: Family Medicine

## 2013-12-02 NOTE — Telephone Encounter (Signed)
ER letter sent 

## 2014-04-20 ENCOUNTER — Encounter (HOSPITAL_COMMUNITY): Payer: Self-pay | Admitting: Emergency Medicine

## 2014-06-16 ENCOUNTER — Emergency Department (HOSPITAL_COMMUNITY): Payer: Managed Care, Other (non HMO)

## 2014-06-16 ENCOUNTER — Emergency Department (HOSPITAL_COMMUNITY)
Admission: EM | Admit: 2014-06-16 | Discharge: 2014-06-16 | Disposition: A | Discharge status: Home / Self Care | Payer: Managed Care, Other (non HMO) | Attending: Emergency Medicine | Admitting: Emergency Medicine

## 2014-06-16 ENCOUNTER — Encounter (HOSPITAL_COMMUNITY): Payer: Self-pay | Admitting: Emergency Medicine

## 2014-06-16 DIAGNOSIS — M7989 Other specified soft tissue disorders: Secondary | ICD-10-CM | POA: Diagnosis present

## 2014-06-16 DIAGNOSIS — G5602 Carpal tunnel syndrome, left upper limb: Secondary | ICD-10-CM | POA: Diagnosis not present

## 2014-06-16 DIAGNOSIS — G5601 Carpal tunnel syndrome, right upper limb: Secondary | ICD-10-CM | POA: Diagnosis not present

## 2014-06-16 DIAGNOSIS — G5603 Carpal tunnel syndrome, bilateral upper limbs: Secondary | ICD-10-CM

## 2014-06-16 DIAGNOSIS — Z3202 Encounter for pregnancy test, result negative: Secondary | ICD-10-CM | POA: Diagnosis not present

## 2014-06-16 DIAGNOSIS — Z79899 Other long term (current) drug therapy: Secondary | ICD-10-CM | POA: Insufficient documentation

## 2014-06-16 LAB — POC URINE PREG, ED: Preg Test, Ur: NEGATIVE

## 2014-06-16 MED ORDER — NAPROXEN 500 MG PO TABS
500.0000 mg | ORAL_TABLET | Freq: Once | ORAL | Status: AC
  Administered 2014-06-16: 500 mg via ORAL
  Filled 2014-06-16: qty 1

## 2014-06-16 MED ORDER — NAPROXEN 500 MG PO TABS: 500.0000 mg | ORAL_TABLET | Freq: Two times a day (BID) | ORAL | Status: DC

## 2014-06-16 NOTE — ED Notes (Signed)
Pt states that she has had swelling in her rt hand for the last three weeks. Pt also states that she began having generalized body aches for the last two weeks and numbness in the rt hand. ROM intact in BUE.

## 2014-06-16 NOTE — Discharge Instructions (Signed)
Please follow the directions provided.  Use the resource guide or the referral provided to find a primary care doctor for follow-up.  Take the naproxen twice a day for pain and inflammation.  Be sure to drink plenty of fluids.  Don't hesitate to return for any new, worsening or concerning symptoms.    SEEK IMMEDIATE MEDICAL CARE IF:  You have new, unexplained symptoms.  Your symptoms get worse and are not helped or controlled with medicines.   Emergency Department Resource Guide 1) Find a Doctor and Pay Out of Pocket Although you won't have to find out who is covered by your insurance plan, it is a good idea to ask around and get recommendations. You will then need to call the office and see if the doctor you have chosen will accept you as a new patient and what types of options they offer for patients who are self-pay. Some doctors offer discounts or will set up payment plans for their patients who do not have insurance, but you will need to ask so you aren't surprised when you get to your appointment.  2) Contact Your Local Health Department Not all health departments have doctors that can see patients for sick visits, but many do, so it is worth a call to see if yours does. If you don't know where your local health department is, you can check in your phone book. The CDC also has a tool to help you locate your state's health department, and many state websites also have listings of all of their local health departments.  3) Find a Walk-in Clinic If your illness is not likely to be very severe or complicated, you may want to try a walk in clinic. These are popping up all over the country in pharmacies, drugstores, and shopping centers. They're usually staffed by nurse practitioners or physician assistants that have been trained to treat common illnesses and complaints. They're usually fairly quick and inexpensive. However, if you have serious medical issues or chronic medical problems, these are  probably not your best option.  No Primary Care Doctor: - Call Health Connect at  [phone] - they can help you locate a primary care doctor that  accepts your insurance, provides certain services, etc. - Physician Referral Service- [phone]  Chronic Pain Problems: Organization         Address  Phone   Notes  [facility]  [phone] Patients need to be referred by their primary care doctor.   Medication Assistance: Organization         Address  Phone   Notes  [facility] [address] [place], [place] [address] [phone] --Must be a resident of [facility] [place] [facility] [place] -- Must have NO insurance coverage whatsoever (no Medicaid/ Medicare, etc.) -- The pt. MUST have a primary care doctor that directs their care regularly and follows them in the community   MedAssist  [phone]   [employer]  [phone]    Agencies that provide inexpensive medical care: Organization         Address  Phone   Notes  [facility]  [phone]   [facility]    [phone]   [facility] [address] [place], [place] [address] [phone]   [facility] [address], [place] [phone]   Planned Parenthood    [phone]   [facility]    [phone]   [address], [place] Phone:  [phone], Fax:  [phone] Hours of Operation:  9 am - 6 pm, M-F.  Also accepts Medicaid/Medicare and self-pay.  [facility] for [address] [place], [address], [place] Phone: [phone], Fax: [phone]. Hours of Operation:  8:30 am - 5:30 pm, M-F.  Also accepts Medicaid and self-pay.  [facility] [address] [place] Phone: [phone]   [place], [place], [place] [phone], Ext. [phone] Mondays & Thursdays:  7-9 AM.  First 15 patients are seen on a first come, first serve basis.    [place] Providers:  Organization         Address  Phone   Notes  [facility] [address], [place] [phone] Also accepts self-pay patients.  [facility] [id number] [place], [place]  [phone]   [address], [place] [phone]   [facility] [address], [place] [phone]   Lucianne Lei [address], [place]   [phone] Only accepts [place] Access [place] patients after they have their name applied to their card.   Self-Pay (no insurance) in [facility]:  Organization         Address  Phone   Notes  Sickle Cell Patients, [facility] Internal Medicine [place] [phone]   [facility] [place] [phone]   [facility] [place]  [place], [place], [place] [phone]   [facility]/Dr. Osei-Bonsu  [address], [place] or [address], [place] [phone] Phone number for both [place] and [place] locations is the same.  Urgent Medical and [facility] [address] [place] [phone]   [facility] [address], [place] or [address] [phone]   [facility] [address] [place] [phone], phone; [phone], fax Sees patients 1st and 3rd Saturday of every month.  Must not qualify for public or private insurance (i.e. Medicaid, Medicare, [place] Health Choice, Veterans' Benefits)  Household income should be no more than 200% of the poverty level The clinic cannot treat you if you are pregnant or think you are pregnant  Sexually transmitted diseases are not treated at the clinic.     Dental Care: Organization         Address  Phone  Notes  [facility] Department of [facility] [place] [phone] Accepts children up to age 40 who are enrolled in [place] or [place]; pregnant women with a Medicaid card; and children who have applied for Medicaid or [place] Health Choice, but were declined, whose parents can pay a reduced fee at time of service.  [facility] of [facility]  [address], [place] [phone] Accepts children up to age 98 who are enrolled in [place] or [place]; pregnant women with a Medicaid card; and children who have applied for Medicaid or [place] Health Choice, but were declined, whose parents can pay a reduced fee at time of service.  [place]  Access PROGRAM  [place] [phone] Patients are seen by appointment only. Walk-ins are not accepted. [place] will see patients 28 years of age and older. Monday - Tuesday (8am-5pm) Most Wednesdays (8:30-5pm) $30 per visit, cash only  [facility] Access PRO[facility]  [address], [facility] [phone] Patients are seen by appointment only. Walk-ins are not accepted. [place] will see patients 93 years of age and older. One Wednesday Evening (Monthly: Volunteer Based).  $30 per visit, cash only  [place]  [phone] for adults; Children under age 22, call Graduate Pediatric Dentistry at [phone]. Children aged 36-14, please call [phone] to request a pediatric application.  Dental services are provided in all areas of dental care including fillings, crowns and bridges, complete and partial dentures, implants, gum treatment, root canals, and extractions. Preventive care is also provided. Treatment is provided to both adults and children. Patients are selected via a lottery and there is often a waiting  list.   [facility] [address], [place]  [phone] [email or web address]   [facility] [address] [place], [place] [phone], Ext. [phone] Second and Fourth Thursday of each month, opens at 6:30 AM; Clinic ends at 9 AM.  Patients are seen on a first-come first-served basis, and a limited number are seen during each clinic.   [facility]  [address] Hillard Danker [place], [place] [phone]   Eligibility Requirements You must have lived in [place], [place], or [place] counties for at least the last three months.   You cannot be eligible for state or federal sponsored [employer], including [employer], [place], or [employer].   You generally cannot be eligible for healthcare insurance through your employer.    How to apply: Eligibility screenings are held every Tuesday and Wednesday afternoon from 1:00 pm until 4:00 pm. You do not need an appointment for the interview!  [facility] [address], [place], [place]   [place]  [place] Department  [place]  [phone]    Behavioral Health Resources in the Community: Intensive Outpatient Programs Organization         Address  Phone  Notes  [place]. [address] [place], [place] [phone]   [facility] Outpatient [address], [place], [place]   ADS: Alcohol & Drug Svcs [address], [place], [place]   [place] [address] [place] [address]  [place], [place] or [phone]   Substance Abuse Resources Organization         Address  Phone  Notes  Alcohol and Drug Services  [phone]   [place]  [phone]   The [place]   Chinita Pester  [phone]   Residential & Outpatient Substance Abuse Program   [phone]   Psychological Services Organization         Address  Phone  Notes  [facility]  [place]  [phone]   [place] [address] [place] or [phone]    Mobile Crisis Teams Organization         Address  Phone  Notes  [facility], Mobile Crisis Care Unit  [phone]   [facility]  [address] [place],  [place] [address] [place] [phone]    Self-Help/Support Groups Organization         Address  Phone             Notes  [facility] [place] - variety of support groups  [place] Call for more information  Narcotics Anonymous (NA), [facility] [address], [employer] [place]  2 meetings at this location   Special educational needs teacher         Address  Phone  Notes  [facility] Residential Treatment [place],    [place]  [phone]   [facility]  [address] [place] [phone], [place], [place]   [place], [place] [phone] Admissions: 8am-3pm M-F  Incentives Substance [place] [address],    [place], [place] [phone]   [facility] [address] [place], [place], [place]   [facility] [address],  [place], [place]   [facility] Programs - Intensive Outpatient [address]een [address], [place], [place]   [facility] ([place] [address].,  [place], [place] [phone] or [phone]   [facility] (RTS) [address], [place], [place] Accepts Medicaid  [facility] [place] [address],  [place] [phone] Substance Abuse/Addiction Treatment   [facility] Organization          Address  Phone  Notes  [facility]  [phone]   Domenic Schwab, PhD [address] Arlis Porta [place], [place]   [phone] or [phone]   [place], [place] [phone]   [facility] [address], [place], [place] [phone] Insurance/Medicaid/sponsorship through [facility] and Families [address]                                    [place], [place] [phone] [place] [address] [place], [place] [phone]    Dr. Adele Schilder  [phone]   [facility] Dept. 1) [address], [place] 2) [place] 3)  [place] [address], [place] [phone]  [phone]   [place] [phone] or [phone] (After Hours)

## 2014-06-16 NOTE — ED Provider Notes (Signed)
CSN: [id number]     Arrival date & time 06/16/14  1905 History  This chart was scribed for non-physician practitioner, Harle BattiestElizabeth Avan Gullett, NP working with Elwin MochaBlair Walden, MD, by Abel PrestoKara Demonbreun, ED Scribe. This patient was seen in room WTR8/WTR8 and the patient's care was started at 8:54 PM.     Chief Complaint  Patient presents with  . Arm Swelling  . Generalized Body Aches     The history is provided by the patient. No language interpreter was used.    HPI Comments: Deborah Little is a 28 y.o. female who presents to the Emergency Department complaining of generalized body aches with onset a week ago.  Pt notes pain in breast, swelling in right hand, right foot pain, chest pain, and cramping following intercourse.  Pt notes currently her arms hurt, her right hand is swollen, and bilateral foot pain. She note her feet feel numb and tingly now.  Pt was seen by a doctor on 12/01 for foot and knee pain and given a muscle relaxer with mild relief. Pt took Tylenol arthiritis for relief. Pt is utd on her flu shot. Pt types at work. Pt is on an IUD. Pt denies any recent falls, fevers, chills, nausea, vomiting, diarrhea, and cough.    Past Medical History  Diagnosis Date  . No pertinent past medical history   . Environmental allergies     prior testing 2008;  Dr. [place] CallasSharma   Past Surgical History  Procedure Laterality Date  . No past surgeries     No family history on file. History  Substance Use Topics  . Smoking status: Never Smoker   . Smokeless tobacco: Never Used  . Alcohol Use: Yes     Comment: socially   OB History    Gravida Para Term Preterm AB TAB SAB Ectopic Multiple Living   1 1 1  0 0 0 0 0 0 1     Review of Systems  Constitutional: Negative for fever and chills.  Respiratory: Negative for cough.   Gastrointestinal: Negative for nausea, vomiting and diarrhea.  Musculoskeletal: Positive for myalgias and arthralgias.  Neurological: Positive for numbness.      Allergies   Review of patient's allergies indicates no active allergies.  Home Medications   Prior to Admission medications   Medication Sig Start Date End Date Taking? Authorizing Provider  diclofenac (VOLTAREN) 50 MG EC tablet Take 1 tablet (50 mg total) by mouth 2 (two) times daily as needed. 11/12/13   Rodolph BongEvan S Corey, MD  fexofenadine (ALLEGRA) 180 MG tablet Take 1 tablet (180 mg total) by mouth daily. 12/05/12   Kermit Baloavid S Theus Espin, PA-C  Olopatadine HCl (PATADAY) 0.2 % SOLN Apply 1 drop to eye daily. 12/05/12   Kermit Baloavid S Ab Leaming, PA-C  phentermine 37.5 MG capsule Take 37.5 mg by mouth every morning.    Historical Provider, MD  triamcinolone cream (KENALOG) 0.5 % Apply 1 application topically 3 (three) times daily. 06/17/13   Kermit Baloavid S Timira Bieda, PA-C   BP 126/69 mmHg  Pulse 88  Temp(Src) 98.1 F (36.7 C) (Oral)  Resp 20  SpO2 100% Physical Exam  Constitutional: She is oriented to person, place, and time. She appears well-developed and well-nourished.  HENT:  Head: Normocephalic.  Eyes: Conjunctivae are normal.  Neck: Normal range of motion. Neck supple.  Cardiovascular: Normal rate.   Pulmonary/Chest: Effort normal.  Musculoskeletal: Normal range of motion.       Cervical back: She exhibits no bony tenderness.  Thoracic back: She exhibits no bony tenderness.       Lumbar back: She exhibits no bony tenderness.  Pain in wrists reproduced with Phalen's test  Neurological: She is alert and oriented to person, place, and time.  5/5 grip strength 5/5 strength with flexion and extension  Skin: Skin is warm and dry.  Psychiatric: She has a normal mood and affect. Her behavior is normal.  Nursing note and vitals reviewed.   ED Course  Procedures (including critical care time) DIAGNOSTIC STUDIES: Oxygen Saturation is 100% on room air, normal by my interpretation.    COORDINATION OF CARE: 9:01 PM Discussed treatment plan with patient at beside, the patient agrees with the plan and has no  further questions at this time.   Labs Review Labs Reviewed  POC URINE PREG, ED    Imaging Review Dg Hand Complete Right  06/16/2014   CLINICAL DATA:  Hand/thumb pain, swelling x2 weeks, no known injury  EXAM: RIGHT HAND - COMPLETE 3+ VIEW  COMPARISON:  01/25/2005  FINDINGS: No fracture or dislocation is seen.  The joint spaces are preserved.  The visualized soft tissues are unremarkable.  IMPRESSION: No fracture or dislocation is seen.   Electronically Signed   By: Charline BillsSriyesh  Krishnan M.D.   On: 06/16/2014 20:17     EKG Interpretation None      MDM   Final diagnoses:  Bilateral carpal tunnel syndrome   28 yo with various complaints but most bothersome symptoms is wrist pain after working.  She has a positive Phalen's test so discussed treatment for carpal tunnel syndrome. Pt is well-appearing, in no acute distress and vital signs are stable.  They appear safe to be discharged.  Discharge include follow-up with their PCP and prescription for NSAIDS and cock-up splints.  Return precautions provided. Pt aware of plan and in agreement.  I personally performed the services described in this documentation, which was scribed in my presence. The recorded information has been reviewed and is accurate.  Filed Vitals:   06/16/14 1922 06/16/14 2153  BP: 126/69 120/80  Pulse: 88 86  Temp: 98.1 F (36.7 C) 98 F (36.7 C)  TempSrc: Oral Oral  Resp: 20 16  SpO2: 100% 100%   Meds given in ED:  Medications  naproxen (NAPROSYN) tablet 500 mg (500 mg Oral Given 06/16/14 2152)    Discharge Medication List as of 06/16/2014  9:36 PM    START taking these medications   Details  naproxen (NAPROSYN) 500 MG tablet Take 1 tablet (500 mg total) by mouth 2 (two) times daily., Starting 06/16/2014, Until Discontinued, Print          Harle BattiestElizabeth Cardell Rachel, NP 06/18/14 1724  Elwin MochaBlair Walden, MD 06/21/14 [phone]

## 2014-06-17 ENCOUNTER — Telehealth (HOSPITAL_BASED_OUTPATIENT_CLINIC_OR_DEPARTMENT_OTHER): Payer: Self-pay | Admitting: Emergency Medicine

## 2014-06-28 ENCOUNTER — Encounter (HOSPITAL_COMMUNITY): Payer: Self-pay

## 2014-06-28 ENCOUNTER — Emergency Department (HOSPITAL_COMMUNITY): Payer: Managed Care, Other (non HMO)

## 2014-06-28 ENCOUNTER — Emergency Department (HOSPITAL_COMMUNITY)
Admission: EM | Admit: 2014-06-28 | Discharge: 2014-06-28 | Disposition: A | Discharge status: Home / Self Care | Payer: Managed Care, Other (non HMO) | Attending: Emergency Medicine | Admitting: Emergency Medicine

## 2014-06-28 DIAGNOSIS — S99912A Unspecified injury of left ankle, initial encounter: Secondary | ICD-10-CM | POA: Diagnosis present

## 2014-06-28 DIAGNOSIS — Y9289 Other specified places as the place of occurrence of the external cause: Secondary | ICD-10-CM | POA: Insufficient documentation

## 2014-06-28 DIAGNOSIS — Y998 Other external cause status: Secondary | ICD-10-CM | POA: Insufficient documentation

## 2014-06-28 DIAGNOSIS — W19XXXA Unspecified fall, initial encounter: Secondary | ICD-10-CM

## 2014-06-28 DIAGNOSIS — Y9389 Activity, other specified: Secondary | ICD-10-CM | POA: Diagnosis not present

## 2014-06-28 DIAGNOSIS — Z791 Long term (current) use of non-steroidal anti-inflammatories (NSAID): Secondary | ICD-10-CM | POA: Diagnosis not present

## 2014-06-28 DIAGNOSIS — Z79899 Other long term (current) drug therapy: Secondary | ICD-10-CM | POA: Insufficient documentation

## 2014-06-28 DIAGNOSIS — S93402A Sprain of unspecified ligament of left ankle, initial encounter: Secondary | ICD-10-CM | POA: Insufficient documentation

## 2014-06-28 DIAGNOSIS — X58XXXA Exposure to other specified factors, initial encounter: Secondary | ICD-10-CM | POA: Insufficient documentation

## 2014-06-28 MED ORDER — IBUPROFEN 600 MG PO TABS: 600.0000 mg | ORAL_TABLET | Freq: Three times a day (TID) | ORAL | Status: DC

## 2014-06-28 MED ORDER — HYDROCODONE-ACETAMINOPHEN 5-325 MG PO TABS: 1.0000 | ORAL_TABLET | Freq: Four times a day (QID) | ORAL | Status: DC | PRN

## 2014-06-28 MED ORDER — HYDROCODONE-ACETAMINOPHEN 5-325 MG PO TABS
1.0000 | ORAL_TABLET | Freq: Once | ORAL | Status: AC
  Administered 2014-06-28: 1 via ORAL
  Filled 2014-06-28: qty 1

## 2014-06-28 MED ORDER — IBUPROFEN 200 MG PO TABS
600.0000 mg | ORAL_TABLET | Freq: Once | ORAL | Status: AC
  Administered 2014-06-28: 600 mg via ORAL
  Filled 2014-06-28: qty 3

## 2014-06-28 NOTE — ED Notes (Signed)
Patient reports she tripped going down some stairs.  States she felt her right foot "twist" as she tried to catch herself with her hands.

## 2014-06-28 NOTE — ED Provider Notes (Signed)
CSN: [id number]     Arrival date & time 06/28/14  2128 History  This chart was scribed for non-physician practitioner, Earley Favor, FNP working with Rolland Porter, MD by Greggory Stallion, ED scribe. This patient was seen in room WTR7/WTR7 and the patient's care was started at 10:00 PM.    Chief Complaint  Patient presents with  . Foot Injury   The history is provided by the patient. No language interpreter was used.    HPI Comments: Deborah Little is a 29 y.o. female who presents to the Emergency Department complaining of right ankle injury that occurred prior to arrival. States she tripped going down the steps and twisted her ankle as she tried to catch herself with her hands. Reports sudden onset pain with associated swelling. Bearing weight and certain movements worsen the pain. She has not yet taken any medications.   Past Medical History  Diagnosis Date  . No pertinent past medical history   . Environmental allergies     prior testing 2008;  Dr.  Callas   Past Surgical History  Procedure Laterality Date  . No past surgeries     History reviewed. No pertinent family history. History  Substance Use Topics  . Smoking status: Never Smoker   . Smokeless tobacco: Never Used  . Alcohol Use: Yes     Comment: socially   OB History    Gravida Para Term Preterm AB TAB SAB Ectopic Multiple Living   0 0 0 0 0 0 1     Review of Systems  Musculoskeletal: Positive for joint swelling and arthralgias.  All other systems reviewed and are negative.  Allergies  Review of patient's allergies indicates no known allergies.  Home Medications   Prior to Admission medications   Medication Sig Start Date End Date Taking? Authorizing Provider  levonorgestrel (MIRENA) 20 MCG/24HR IUD 1 each by Intrauterine route continuous.   Yes Historical Provider, MD  naproxen (NAPROSYN) 500 MG tablet Take 1 tablet (500 mg total) by mouth 2 (two) times daily. 06/16/14  Yes Harle Battiest, NP  diclofenac  (VOLTAREN) 50 MG EC tablet Take 1 tablet (50 mg total) by mouth 2 (two) times daily as needed. Patient not taking: Reported on 06/28/2014 11/12/13   Rodolph Bong, MD  fexofenadine (ALLEGRA) 180 MG tablet Take 1 tablet (180 mg total) by mouth daily. Patient not taking: Reported on 06/28/2014 12/05/12   Kermit Balo Tysinger, PA-C  HYDROcodone-acetaminophen (NORCO/VICODIN) 5-325 MG per tablet Take 1 tablet by mouth every 6 (six) hours as needed for moderate pain. 06/28/14   Arman Filter, NP  ibuprofen (ADVIL,MOTRIN) 600 MG tablet Take 1 tablet (600 mg total) by mouth 3 (three) times daily. 06/28/14   Arman Filter, NP  Olopatadine HCl (PATADAY) 0.2 % SOLN Apply 1 drop to eye daily. Patient not taking: Reported on 06/28/2014 12/05/12   Kermit Balo Tysinger, PA-C  phentermine 37.5 MG capsule Take 37.5 mg by mouth every morning.    Historical Provider, MD  triamcinolone cream (KENALOG) 0.5 % Apply 1 application topically 3 (three) times daily. Patient not taking: Reported on 06/28/2014 06/17/13   Kermit Balo Tysinger, PA-C   BP 134/72 mmHg  Pulse 90  Temp(Src) 97.9 F (36.6 C) (Oral)  Resp 20  SpO2 100%  LMP 05/29/2014   Physical Exam  Constitutional: She is oriented to person, place, and time. She appears well-developed and well-nourished. No distress.  HENT:  Head: Normocephalic and atraumatic.  Eyes: Conjunctivae and  EOM are normal.  Neck: Neck supple. No tracheal deviation present.  Cardiovascular: Normal rate.   Pulmonary/Chest: Effort normal. No respiratory distress.  Musculoskeletal: Normal range of motion. She exhibits tenderness. She exhibits no edema.  Right medial malleolar tenderness. Slight discoloration at ankle joint. Decreased ROM. No tenderness over achilles.   Neurological: She is alert and oriented to person, place, and time.  Skin: Skin is warm and dry.  Psychiatric: She has a normal mood and affect. Her behavior is normal.  Nursing note and vitals reviewed.   ED Course  Procedures  (including critical care time)  DIAGNOSTIC STUDIES: Oxygen Saturation is 100% on RA, normal by my interpretation.    COORDINATION OF CARE: 10:02 PM-Discussed treatment plan which includes knee xray and pain medication with pt at bedside and pt agreed to plan.   Labs Review Labs Reviewed - No data to display  Imaging Review Dg Ankle Complete Right  06/28/2014   CLINICAL DATA:  Tripped while going down stairs, with twisting injury to the right foot and anterior right ankle pain. Initial encounter.  EXAM: RIGHT ANKLE - COMPLETE 3+ VIEW  COMPARISON:  None.  FINDINGS: There is no evidence of fracture or dislocation. The ankle mortise is intact; the interosseous space is within normal limits. No talar tilt or subluxation is seen.  The joint spaces are preserved. No significant soft tissue abnormalities are seen.  IMPRESSION: No evidence of fracture or dislocation.   Electronically Signed   By: Roanna RaiderJeffery  Chang M.D.   On: 06/28/2014 22:46     EKG Interpretation None      MDM   Final diagnoses:  Fall  Ankle sprain, left, initial encounter       I personally performed the services described in this documentation, which was scribed in my presence. The recorded information has been reviewed and is accurate.  Arman FilterGail K Ilyse Tremain, NP 06/29/14 2039  Rolland PorterMark James, MD 07/08/14 2036

## 2014-06-28 NOTE — Discharge Instructions (Signed)
Acute Ankle Sprain °with Phase I Rehab °An acute ankle sprain is a partial or complete tear in one or more of the ligaments of the ankle due to traumatic injury. The severity of the injury depends on both the number of ligaments sprained and the grade of sprain. There are 3 grades of sprains.  °· A grade 1 sprain is a mild sprain. There is a slight pull without obvious tearing. There is no loss of strength, and the muscle and ligament are the correct length. °· A grade 2 sprain is a moderate sprain. There is tearing of fibers within the substance of the ligament where it connects two bones or two cartilages. The length of the ligament is increased, and there is usually decreased strength. °· A grade 3 sprain is a complete rupture of the ligament and is uncommon. °In addition to the grade of sprain, there are three types of ankle sprains.  °Lateral ankle sprains: This is a sprain of one or more of the three ligaments on the outer side (lateral) of the ankle. These are the most common sprains. °Medial ankle sprains: There is one large triangular ligament of the inner side (medial) of the ankle that is susceptible to injury. Medial ankle sprains are less common. °Syndesmosis, "high ankle," sprains: The syndesmosis is the ligament that connects the two bones of the lower leg. Syndesmosis sprains usually only occur with very severe ankle sprains. °SYMPTOMS °· Pain, tenderness, and swelling in the ankle, starting at the side of injury that may progress to the whole ankle and foot with time. °· "Pop" or tearing sensation at the time of injury. °· Bruising that may spread to the heel. °· Impaired ability to walk soon after injury. °CAUSES  °· Acute ankle sprains are caused by trauma placed on the ankle that temporarily forces or pries the anklebone (talus) out of its normal socket. °· Stretching or tearing of the ligaments that normally hold the joint in place (usually due to a twisting injury). °RISK INCREASES  WITH: °· Previous ankle sprain. °· [place] in which the foot may land awkwardly (i.e., basketball, volleyball, or soccer) or walking or running on uneven or rough surfaces. °· Shoes with inadequate support to prevent sideways motion when stress occurs. °· Poor strength and flexibility. °· Poor balance skills. °· Contact [place]. °PREVENTION  °· Warm up and stretch properly before activity. °· Maintain physical fitness: °¨ Ankle and leg flexibility, muscle strength, and endurance. °¨ Cardiovascular fitness. °· Balance training activities. °· Use proper technique and have a coach correct improper technique. °· Taping, protective strapping, bracing, or high-top tennis shoes may help prevent injury. Initially, tape is best; however, it loses most of its support function within 10 to 15 minutes. °· Wear proper-fitted protective shoes (High-top shoes with taping or bracing is more effective than either alone). °· Provide the ankle with support during [place] and practice activities for 12 months following injury. °PROGNOSIS  °· If treated properly, ankle sprains can be expected to recover completely; however, the length of recovery depends on the degree of injury. °· A grade 1 sprain usually heals enough in 5 to 7 days to allow modified activity and requires an average of 6 weeks to heal completely. °· A grade 2 sprain requires 6 to 10 weeks to heal completely. °· A grade 3 sprain requires 12 to 16 weeks to heal. °· A syndesmosis sprain often takes more than 3 months to heal. °RELATED COMPLICATIONS  °· Frequent recurrence of symptoms may   result in a chronic problem. Appropriately addressing the problem the first time decreases the frequency of recurrence and optimizes healing time. Severity of the initial sprain does not predict the likelihood of later instability. °· Injury to other structures (bone, cartilage, or tendon). °· A chronically unstable or arthritic ankle joint is a possibility with repeated  sprains. °TREATMENT °Treatment initially involves the use of ice, medication, and compression bandages to help reduce pain and inflammation. Ankle sprains are usually immobilized in a walking cast or boot to allow for healing. Crutches may be recommended to reduce pressure on the injury. After immobilization, strengthening and stretching exercises may be necessary to regain strength and a full range of motion. Surgery is rarely needed to treat ankle sprains. °MEDICATION  °· Nonsteroidal anti-inflammatory medications, such as aspirin and ibuprofen (do not take for the first 3 days after injury or within 7 days before surgery), or other minor pain relievers, such as acetaminophen, are often recommended. Take these as directed by your caregiver. Contact your caregiver immediately if any bleeding, stomach upset, or signs of an allergic reaction occur from these medications. °· Ointments applied to the skin may be helpful. °· Pain relievers may be prescribed as necessary by your caregiver. Do not take prescription pain medication for longer than 4 to 7 days. Use only as directed and only as much as you need. °HEAT AND COLD °· Cold treatment (icing) is used to relieve pain and reduce inflammation for acute and chronic cases. Cold should be applied for 10 to 15 minutes every 2 to 3 hours for inflammation and pain and immediately after any activity that aggravates your symptoms. Use ice packs or an ice massage. °· Heat treatment may be used before performing stretching and strengthening activities prescribed by your caregiver. Use a heat pack or a warm soak. °SEEK IMMEDIATE MEDICAL CARE IF:  °· Pain, swelling, or bruising worsens despite treatment. °· You experience pain, numbness, discoloration, or coldness in the foot or toes. °· New, unexplained symptoms develop (drugs used in treatment may produce side effects.) °EXERCISES  °PHASE I EXERCISES °RANGE OF MOTION (ROM) AND STRETCHING EXERCISES - Ankle Sprain, Acute Phase I,  Weeks 1 to 2 °These exercises may help you when beginning to restore flexibility in your ankle. You will likely work on these exercises for the 1 to 2 weeks after your injury. Once your physician, physical therapist, or athletic trainer sees adequate progress, he or she will advance your exercises. While completing these exercises, remember:  °· Restoring tissue flexibility helps normal motion to return to the joints. This allows healthier, less painful movement and activity. °· An effective stretch should be held for at least 30 seconds. °· A stretch should never be painful. You should only feel a gentle lengthening or release in the stretched tissue. °RANGE OF MOTION - Dorsi/Plantar Flexion °· While sitting with your right / left knee straight, draw the top of your foot upwards by flexing your ankle. Then reverse the motion, pointing your toes downward. °· Hold each position for __________ seconds. °· After completing your first set of exercises, repeat this exercise with your knee bent. °Repeat __________ times. Complete this exercise __________ times per day.  °RANGE OF MOTION - Ankle Alphabet °· Imagine your right / left big toe is a pen. °· Keeping your hip and knee still, write out the entire alphabet with your "pen." Make the letters as large as you can without increasing any discomfort. °Repeat __________ times. Complete this exercise __________   times per day.  °STRENGTHENING EXERCISES - Ankle Sprain, Acute -Phase I, Weeks 1 to 2 °These exercises may help you when beginning to restore strength in your ankle. You will likely work on these exercises for 1 to 2 weeks after your injury. Once your physician, physical therapist, or athletic trainer sees adequate progress, he or she will advance your exercises. While completing these exercises, remember:  °· Muscles can gain both the endurance and the strength needed for everyday activities through controlled exercises. °· Complete these exercises as instructed by  your physician, physical therapist, or athletic trainer. Progress the resistance and repetitions only as guided. °· You may experience muscle soreness or fatigue, but the pain or discomfort you are trying to eliminate should never worsen during these exercises. If this pain does worsen, stop and make certain you are following the directions exactly. If the pain is still present after adjustments, discontinue the exercise until you can discuss the trouble with your clinician. °STRENGTH - Dorsiflexors °· Secure a rubber exercise band/tubing to a fixed object (i.e., table, pole) and loop the other end around your right / left foot. °· Sit on the floor facing the fixed object. The band/tubing should be slightly tense when your foot is relaxed. °· Slowly draw your foot back toward you using your ankle and toes. °· Hold this position for __________ seconds. Slowly release the tension in the band and return your foot to the starting position. °Repeat __________ times. Complete this exercise __________ times per day.  °STRENGTH - Plantar-flexors  °· Sit with your right / left leg extended. Holding onto both ends of a rubber exercise band/tubing, loop it around the ball of your foot. Keep a slight tension in the band. °· Slowly push your toes away from you, pointing them downward. °· Hold this position for __________ seconds. Return slowly, controlling the tension in the band/tubing. °Repeat __________ times. Complete this exercise __________ times per day.  °STRENGTH - Ankle Eversion °· Secure one end of a rubber exercise band/tubing to a fixed object (table, pole). Loop the other end around your foot just before your toes. °· Place your fists between your knees. This will focus your strengthening at your ankle. °· Drawing the band/tubing across your opposite foot, slowly, pull your little toe out and up. Make sure the band/tubing is positioned to resist the entire motion. °· Hold this position for __________ seconds. °Have  your muscles resist the band/tubing as it slowly pulls your foot back to the starting position.  °Repeat __________ times. Complete this exercise __________ times per day.  °STRENGTH - Ankle Inversion °· Secure one end of a rubber exercise band/tubing to a fixed object (table, pole). Loop the other end around your foot just before your toes. °· Place your fists between your knees. This will focus your strengthening at your ankle. °· Slowly, pull your big toe up and in, making sure the band/tubing is positioned to resist the entire motion. °· Hold this position for __________ seconds. °· Have your muscles resist the band/tubing as it slowly pulls your foot back to the starting position. °Repeat __________ times. Complete this exercises __________ times per day.  °STRENGTH - Towel Curls °· Sit in a chair positioned on a non-carpeted surface. °· Place your right / left foot on a towel, keeping your heel on the floor. °· Pull the towel toward your heel by only curling your toes. Keep your heel on the floor. °· If instructed by your physician, physical therapist,   or athletic trainer, add weight to the end of the towel. Repeat ____5______ times. Complete this exercise ______2____ times per day. Document Released: 01/04/2005 Document Revised: 10/20/2013 Document Reviewed: 09/17/2008 [facility] Patient Information 2015 [place], [place]. This information is not intended to replace advice given to you by your health care provider. Make sure you discuss any questions you have with your health care provider. Wear the splint for comfort for the next 7-10 days

## 2014-06-28 NOTE — ED Notes (Signed)
Ortho tech at bedside applying ASO ankle and teaching crutches.

## 2014-07-03 ENCOUNTER — Ambulatory Visit (INDEPENDENT_AMBULATORY_CARE_PROVIDER_SITE_OTHER): Payer: Managed Care, Other (non HMO) | Admitting: Medical

## 2014-07-03 ENCOUNTER — Encounter: Payer: Self-pay | Admitting: Medical

## 2014-07-03 VITALS — BP 124/82 | HR 64 | Temp 98.2°F | Resp 16 | Wt 275.0 lb

## 2014-07-03 DIAGNOSIS — E669 Obesity, unspecified: Secondary | ICD-10-CM

## 2014-07-03 DIAGNOSIS — S93401A Sprain of unspecified ligament of right ankle, initial encounter: Secondary | ICD-10-CM

## 2014-07-03 NOTE — Progress Notes (Signed)
Subjective: Here for foot injury.  Sprained ankle this past Sunday 06/28/14.  Was given ankle brace to use, crutches.  Was advised 5-7 days before feeling better.  Using Vicodin.  Called orthopedist, and advised they couldn't get her in til next Thursday.   Cleans house for a living, and having pain with ambulation.   Injury occurred when she was coming out her back porch, and almost fell over her young son.  She fell the opposite direction as not to fall on her 603 yo son. Larey SeatFell off the top step to the next step then fell out onto ground onto her right side.  Didn't feel a pop.  Felt a slow motion twist.  Had immediate pain.   Limped off inside the house.   Has been able to ambulate but with pain.   Using ice some, elevating the leg, using the brace.   Not taking the ibuprofen, but not taking the ibuprofen.    Also interested in coming here for weight loss medication.   Started seeing bariatric clinic taking phentermine but visits are very expensive there.  No other aggravating or relieving factors. No other complaint.  ROS as in subjective  Objective: BP 124/82 mmHg  Pulse 64  Temp(Src) 98.2 F (36.8 C) (Oral)  Resp 16  Wt 275 lb (124.739 kg)  LMP 05/29/2014 Gen:wd, wn, nad Skin: warm, dry, no obvious ecchymosis MSK: tender over right medial malleolus and deltoid ligament, ROM reduced in general of right ankle, otherwise foot nontender, no deformity, toes nontender and norma ROM, rest of LE exam unremarkable Ext: mild swelling of medial right ankle Pulses: normal, cap refill normal of feet Neuro: normal foot and ankle sensation   Assessment: Encounter Diagnoses  Name Primary?  . Right ankle sprain, initial encounter Yes  . Obesity     Plan: Ankle sprain - mild to moderate.  Discussed injury, findings, treatment.  Reviewed recent ED notes and ankle xray from 06/28/14.  Specific recommendations today include:  Use the Ibuprofen 2-3 times daily  Use the Vicodin as needed for worse  pain  Ice the ankle with bucket of cold water 2-3 times daily all weekend  Continue to use the ankle brace  Rest/stay off the foot this weekend  Elevated the leg when possible  Start doing towel rehab stretches as we discussed  If not significantly improved by Monday, call back  Otherwise recheck 1wk.  Obesity - return for physical, labs, discussion on weight loss medication and weight loss efforts

## 2014-07-03 NOTE — Patient Instructions (Signed)
Thank you for giving me the opportunity to serve you today.    Your diagnosis today includes: Encounter Diagnosis  Name Primary?  . Right ankle sprain, initial encounter Yes     Specific recommendations today include:  Use the Ibuprofen 2-3 times daily  Use the Vicodin as needed for worse pain  Ice the ankle with bucket of cold water 2-3 times daily all weekend  Continue to use the ankle brace  Rest/stay off the foot this weekend  Elevated the leg when possible  Start doing towel rehab stretches as we discussed  If not significantly improved by Monday, call back  Return in about 1 week (around 07/10/2014).    I have included other useful information below for your review.  Ankle Sprain An ankle sprain is an injury to the strong, fibrous tissues (ligaments) that hold the bones of your ankle joint together.  CAUSES An ankle sprain is usually caused by a fall or by twisting your ankle. Ankle sprains most commonly occur when you step on the outer edge of your foot, and your ankle turns inward. People who participate in sports are more prone to these types of injuries.  SYMPTOMS   Pain in your ankle. The pain may be present at rest or only when you are trying to stand or walk.  Swelling.  Bruising. Bruising may develop immediately or within 1 to 2 days after your injury.  Difficulty standing or walking, particularly when turning corners or changing directions. DIAGNOSIS  Your caregiver will ask you details about your injury and perform a physical exam of your ankle to determine if you have an ankle sprain. During the physical exam, your caregiver will press on and apply pressure to specific areas of your foot and ankle. Your caregiver will try to move your ankle in certain ways. An X-ray exam may be done to be sure a bone was not broken or a ligament did not separate from one of the bones in your ankle (avulsion fracture).  TREATMENT  Certain types of braces can help  stabilize your ankle. Your caregiver can make a recommendation for this. Your caregiver may recommend the use of medicine for pain. If your sprain is severe, your caregiver may refer you to a surgeon who helps to restore function to parts of your skeletal system (orthopedist) or a physical therapist. HOME CARE INSTRUCTIONS   Apply ice to your injury for 1-2 days or as directed by your caregiver. Applying ice helps to reduce inflammation and pain.  Put ice in a plastic bag.  Place a towel between your skin and the bag.  Leave the ice on for 15-20 minutes at a time, every 2 hours while you are awake.  Only take over-the-counter or prescription medicines for pain, discomfort, or fever as directed by your caregiver.  Elevate your injured ankle above the level of your heart as much as possible for 2-3 days.  If your caregiver recommends crutches, use them as instructed. Gradually put weight on the affected ankle. Continue to use crutches or a cane until you can walk without feeling pain in your ankle.  If you have a plaster splint, wear the splint as directed by your caregiver. Do not rest it on anything harder than a pillow for the first 24 hours. Do not put weight on it. Do not get it wet. You may take it off to take a shower or bath.  You may have been given an elastic bandage to wear around your ankle  to provide support. If the elastic bandage is too tight (you have numbness or tingling in your foot or your foot becomes cold and blue), adjust the bandage to make it comfortable.  If you have an air splint, you may blow more air into it or let air out to make it more comfortable. You may take your splint off at night and before taking a shower or bath. Wiggle your toes in the splint several times per day to decrease swelling. SEEK MEDICAL CARE IF:   You have rapidly increasing bruising or swelling.  Your toes feel extremely cold or you lose feeling in your foot.  Your pain is not relieved  with medicine. SEEK IMMEDIATE MEDICAL CARE IF:  Your toes are numb or blue.  You have severe pain that is increasing. MAKE SURE YOU:   Understand these instructions.  Will watch your condition.  Will get help right away if you are not doing well or get worse. Document Released: 06/05/2005 Document Revised: 02/28/2012 Document Reviewed: 06/17/2011 [facility] Patient Information 2015 [place], [place]. This information is not intended to replace advice given to you by your health care provider. Make sure you discuss any questions you have with your health care provider.

## 2014-07-07 ENCOUNTER — Telehealth: Payer: Self-pay | Admitting: Medical

## 2014-07-07 NOTE — Telephone Encounter (Signed)
Pt returned to work today due to ankle issue. Her part time job is asking her for a note stating that she can return to work today or a note stating the timeframe she should remain out of work. Call pt when this is completed

## 2014-07-07 NOTE — Telephone Encounter (Signed)
First step is to call patient and see if back to normal or where her pain level is compared to when I saw her.   I thought I specified a 1wk f/u appt though as we gave note for work

## 2014-07-08 NOTE — Telephone Encounter (Signed)
Patient is aware of the message. She states that she is somewhat better. She moved her appointment up to Thursday to get her note for work..Marland Kitchen

## 2014-07-09 ENCOUNTER — Ambulatory Visit (INDEPENDENT_AMBULATORY_CARE_PROVIDER_SITE_OTHER): Payer: Managed Care, Other (non HMO) | Admitting: Medical

## 2014-07-09 ENCOUNTER — Telehealth: Payer: Self-pay | Admitting: Medical

## 2014-07-09 ENCOUNTER — Other Ambulatory Visit: Payer: Self-pay | Admitting: Medical

## 2014-07-09 ENCOUNTER — Encounter: Payer: Self-pay | Admitting: Medical

## 2014-07-09 VITALS — BP 110/70 | HR 74 | Temp 98.7°F | Resp 15 | Wt 276.0 lb

## 2014-07-09 DIAGNOSIS — E669 Obesity, unspecified: Secondary | ICD-10-CM

## 2014-07-09 DIAGNOSIS — S93401D Sprain of unspecified ligament of right ankle, subsequent encounter: Secondary | ICD-10-CM

## 2014-07-09 LAB — BASIC METABOLIC PANEL
BUN: 8 mg/dL (ref 6–23)
CHLORIDE: 103 meq/L (ref 96–112)
CO2: 25 meq/L (ref 19–32)
CREATININE: 0.73 mg/dL (ref 0.50–1.10)
Calcium: 9.3 mg/dL (ref 8.4–10.5)
Glucose, Bld: 88 mg/dL (ref 70–99)
Potassium: 4.2 mEq/L (ref 3.5–5.3)
Sodium: 137 mEq/L (ref 135–145)

## 2014-07-09 LAB — LIPID PANEL
Cholesterol: 165 mg/dL (ref 0–200)
HDL: 38 mg/dL — ABNORMAL LOW
LDL Cholesterol: 108 mg/dL — ABNORMAL HIGH (ref 0–99)
Total CHOL/HDL Ratio: 4.3 ratio
Triglycerides: 95 mg/dL
VLDL: 19 mg/dL (ref 0–40)

## 2014-07-09 LAB — TSH: TSH: 1.155 u[IU]/mL (ref 0.350–4.500)

## 2014-07-09 LAB — HEMOGLOBIN A1C
Hgb A1c MFr Bld: 5.9 % — ABNORMAL HIGH (ref <5.7)
MEAN PLASMA GLUCOSE: 123 mg/dL — AB (ref <117)

## 2014-07-09 MED ORDER — PHENTERMINE HCL 37.5 MG PO TABS: 37.5000 mg | ORAL_TABLET | Freq: Every day | ORAL | Status: DC

## 2014-07-09 MED ORDER — PHENTERMINE-TOPIRAMATE ER 7.5-46 MG PO CP24: 1.0000 | ORAL_CAPSULE | ORAL | Status: DC

## 2014-07-09 MED ORDER — PHENTERMINE-TOPIRAMATE ER 3.75-23 MG PO CP24: 1.0000 | ORAL_CAPSULE | ORAL | Status: DC

## 2014-07-09 NOTE — Progress Notes (Signed)
done

## 2014-07-09 NOTE — Progress Notes (Signed)
Subjective: Here for recheck on right ankle sprain. Completely rested ankle this weekend, did icing, elevation, used her brace.  Been on foot yesterday and today, things seem back to normal.  Not taking any medication now for pain.  Doing fine.  No swelling.  No numbness, tingling, weakness.  Date of injury was 06/28/14.  Cleans house for a living, and having pain with ambulation.   Injury occurred when she was coming out her back porch, and almost fell over her young son.  She fell the opposite direction as not to fall on her 563 yo son. Larey SeatFell off the top step to the next step then fell out onto ground onto her right side.  Didn't feel a pop.  Felt a slow motion twist.  Had immediate pain.   Limped off inside the house.   Has been able to ambulate but with pain.     Is interested in weight loss medication.  Started seeing bariatric clinic last year 2015.  Went for 2 months, and after losing some weight, quit going.  But later gained weight back.  Visits there were expensive.  No prior dietician or nutritionist visits . No other prior medications.  Has gym membership at [employer].  Doing water aerobics at aquatic center.  $10 for 10 day pass.  Goal weight - none particular.    Pant size 16-18.  Weight currently 276 lb, heaviest 285lb.  High school 240lb.    No other aggravating or relieving factors. No other complaint.  ROS as in subjective  Objective: BP 110/70 mmHg  Pulse 74  Temp(Src) 98.7 F (37.1 C) (Oral)  Resp 15  Wt 276 lb (125.193 kg)  LMP 07/05/2014  Gen:wd, wn, nad Skin: warm, dry, no obvious ecchymosis MSK: currently no tenderness of either ankle or feet, no deformity other than somewhat flat arches, otherwise feet and ankle with normal ROM, nontender, no deformity.  Rest of LE exam unremarkable Ext: no swelling of ankle or foot Pulses: normal, cap refill normal of feet Neuro: normal foot and ankle sensation   Assessment: Encounter Diagnoses  Name Primary?  . Obesity Yes  .  Right ankle sprain, subsequent encounter     Plan: Ankle sprain - much improved.   Released back to work.  Can c/t caution and rehab technique as discussed, and sprain should gradually get back to feeling complete normal within 1-2 weeks, but from safety and work stand point, can resume normal work activity.  Obesity - labs today.  Had long discussion about diet, healthy habits, exercise, food to avoid, goal setting.  Discussed medications.  Begin Qysmia after discussing risks/benefits.   If too expensive, revert back to phentermine which was also discussed.  F/u 4-6wk.

## 2014-07-09 NOTE — Telephone Encounter (Signed)
See other msg.  Call out phentermine

## 2014-07-09 NOTE — Telephone Encounter (Signed)
Pt says Vincenza HewsShane told her to call if Merril AbbeQysmia was too expensive. It will cost $200 for 30 pill so it is too expensive so can Vincenza HewsShane give pt the Phentermine as they discussed. If he call this in, [address] [facility] [address]

## 2014-07-09 NOTE — Patient Instructions (Addendum)
  Thank you for giving me the opportunity to serve you today.    Your diagnosis today includes: Encounter Diagnoses  Name Primary?  . Right ankle sprain, subsequent encounter Yes  . Obesity      Specific recommendations today include: Diet  Increase your water intake, get at least 64 ounces of water daily  Eat 3-4 fruits daily  Eat plenty of vegetables throughout the day, preferably each meal  Eat good sources of grains such as oatmeal, barley, whole grain pasta, whole grain bread, but limit the serving size to 1 cup of oatmeal or pasta per meal or 2 slices of bread per meal  We don't need to meat at each meal, however if you do eat meat, limit serving size to the size of your palm, and eat chicken fish or Mal[place]urkey, lean cuts of meat  Eat beans every day as this is a good nutrient source and helps to curb appetite  Consider using a program such as Weight Watchers  Consider using a Smart phone app such as My Fitness PAL or [employer]rong to track your calories and progress   Things to limit or avoid:  Avoid fast food, fried foods, fatty foods  Limit sweets, ice cream, cake and other baked goods  Avoid soda, beer, alcohol, sweet tea  Exercise  You need to be exercising most days of the week for 30-45 minutes or more  Good forms of exercise include walking, hiking, stationary bike or bicycling outside, lap swimming, aerobics class, dance, Zumba  Consider getting a trainer at a gym to help with exercise  Medication  Begin Qsymia weight loss medication  Start by taking the Qsymia 3.75/23 mg dose, once daily in the morning before breakfast for the first 2 weeks  Then increase to the Qsymia 7.5/46mg  dose, once daily in the morning before breakfast  You will need to use the coupon card to call and activate the medication  If your insurance does not cover the weight loss medicine listed above, check on the insurance coverage for the following  medications:  Belviq  Contrave  Consider weighing yourself daily to keep track of your weight   Return 42mo.

## 2014-07-09 NOTE — Telephone Encounter (Signed)
Called into [facility] [address]

## 2014-07-10 ENCOUNTER — Institutional Professional Consult (permissible substitution): Payer: Managed Care, Other (non HMO) | Admitting: Medical

## 2014-07-21 ENCOUNTER — Ambulatory Visit (INDEPENDENT_AMBULATORY_CARE_PROVIDER_SITE_OTHER): Payer: Managed Care, Other (non HMO) | Admitting: Medical

## 2014-07-21 ENCOUNTER — Ambulatory Visit: Payer: Managed Care, Other (non HMO) | Admitting: Medical

## 2014-07-21 ENCOUNTER — Encounter: Payer: Self-pay | Admitting: Medical

## 2014-07-21 VITALS — BP 120/80 | HR 84 | Temp 98.0°F | Resp 16 | Wt 270.0 lb

## 2014-07-21 DIAGNOSIS — M659 Synovitis and tenosynovitis, unspecified: Secondary | ICD-10-CM

## 2014-07-21 MED ORDER — NAPROXEN 375 MG PO TABS: 375.0000 mg | ORAL_TABLET | Freq: Two times a day (BID) | ORAL | Status: DC

## 2014-07-21 NOTE — Progress Notes (Addendum)
Subjective: Here for right hand issues, possible CTS.  Has seen other doctors in the past for this, told it was carpal tunnel syndrome, one doctor told her she had arthritis, just had an x-ray in December for this.  Was given NSAID prior.   Lately hurting and tingling a lot in right wrist and thumb.  Right handed.  Denies eating or consuming a lot of salt.  Denies numbness.  Gets tingling in right thumb.  Sometimes feels like she has to shake the hand awake.  Works at [employer], on phone and computer all day.  No other pain in the arm no other complaint. No other aggravating or relieving factor  Past Medical History  Diagnosis Date  . Environmental allergies     prior testing 2008;  Dr. [place] CallasSharma  . Obesity   . IUD (intrauterine device) in place 06/2010    mirena   ROS as in subjective   Objective: BP 120/80 mmHg  Pulse 84  Temp(Src) 98 F (36.7 C) (Oral)  Resp 16  Wt 270 lb (122.471 kg)  LMP 07/05/2014  Gen: wd, wn, nad Skin: No erythema or ecchymosis or other abnormality MSK: Tender over right base of thumb, + Finkelstein test, tender along lateral forearm with wrist range of motion, all suggestive of tenosynovitis Neuro: Normal right arm sensation DTRs, negative Phalen's or Tinel's Right arm pulses and cap refill normal Left arm normal exam  Assessment: Encounter Diagnosis  Name Primary?  . Tenosynovitis of right hand Yes    Plan: Reviewed December 2015 right hand x-ray which was normal.  Discussed findings, symptoms, diagnosis, and treatment plan. Begin thumb spica splint, rice, rest, elevation, ice, begin Naprosyn, recheck in 2 weeks

## 2014-07-28 ENCOUNTER — Telehealth: Payer: Self-pay | Admitting: Medical

## 2014-07-28 ENCOUNTER — Other Ambulatory Visit: Payer: Self-pay | Admitting: Family Medicine

## 2014-07-28 MED ORDER — PHENTERMINE HCL 37.5 MG PO TABS: 37.5000 mg | ORAL_TABLET | Freq: Every day | ORAL | Status: DC

## 2014-07-28 NOTE — Telephone Encounter (Signed)
Patient is aware of Deborah CoveyShane Tysinger PA message in full detail I called out the medication to her pharmacy I also order this in the system

## 2014-07-28 NOTE — Telephone Encounter (Signed)
Please call out #[phone] and no refill and advise this is the only time I will refill this for such a reason. Cause the type of medicine this is we do not normally refill for lost or stolen medication

## 2014-07-28 NOTE — Telephone Encounter (Signed)
Pt called and stated that she lost her bottle of pills for adipex. She is requesting that another supply be called in for her. I did explain that insurance will not pay for and she will have to pay out of pocket. [facility]

## 2014-07-29 ENCOUNTER — Telehealth: Payer: Self-pay | Admitting: Medical

## 2014-07-29 NOTE — Telephone Encounter (Signed)
Pharmacy asking for authorization for pt to refill Phentermine early on today. Pt refill last on 07/09/14 and state she misplaced the rest of the med and need to refill early for that reason. Is this Ok?

## 2014-07-29 NOTE — Telephone Encounter (Signed)
Yes this 1 time

## 2014-07-29 NOTE — Telephone Encounter (Signed)
I called [facility] and gave them the okay to refill the medication per Crosby Oysteravid Tysinger PA

## 2014-08-05 ENCOUNTER — Telehealth: Payer: Self-pay | Admitting: Medical

## 2014-08-05 NOTE — Telephone Encounter (Signed)
I received a request from [employer] regarding a disability request.  Please find out what this is about, is this short-term disability is this something else?  Please go back and verify what note we gave her for the last 2 visits, one regarding tenosynovitis, one regarding ankle pain.  I certainly didn't take her completely out of work for an extended period of time, and usually for those 2 injuries we would give some type of work restrictions but not completely out of work  Please inquire

## 2014-08-07 ENCOUNTER — Ambulatory Visit: Payer: Managed Care, Other (non HMO) | Admitting: Medical

## 2014-08-10 ENCOUNTER — Telehealth: Payer: Self-pay | Admitting: Medical

## 2014-08-10 NOTE — Telephone Encounter (Signed)
Called pt to get details regarding the fax from her employer, she states her job is very strict and they can't miss any days without doctor notes.  She is on vacation and will call me back when she gets home to give me information on when she was out, etc. The only note I can see is a release to go back to work on 07/09/14.

## 2014-08-12 NOTE — Telephone Encounter (Signed)
Vernona RiegerLaura is handling this and she has spoke with the patient and the patient is suppose to be returning her phone call

## 2014-08-25 NOTE — Telephone Encounter (Signed)
Left another message for pt to call back.

## 2014-09-16 ENCOUNTER — Telehealth: Payer: Self-pay | Admitting: Medical

## 2014-09-16 NOTE — Telephone Encounter (Signed)
I called pt back regarding papers from her job and she states [facility] and [facility] filled out papers.  Our copies of blank paperwork were shredded.

## 2014-09-29 ENCOUNTER — Ambulatory Visit: Payer: Managed Care, Other (non HMO) | Admitting: Family Medicine

## 2014-10-01 ENCOUNTER — Encounter: Payer: Self-pay | Admitting: Family Medicine

## 2014-10-01 ENCOUNTER — Ambulatory Visit: Payer: Managed Care, Other (non HMO) | Admitting: Family Medicine

## 2014-10-01 ENCOUNTER — Ambulatory Visit (INDEPENDENT_AMBULATORY_CARE_PROVIDER_SITE_OTHER): Payer: Medicaid Other | Admitting: Family Medicine

## 2014-10-01 VITALS — BP 116/72 | HR 78 | Wt 279.0 lb

## 2014-10-01 DIAGNOSIS — M79644 Pain in right finger(s): Secondary | ICD-10-CM

## 2014-10-01 NOTE — Progress Notes (Signed)
   Subjective:    Patient ID: Deborah Little, female    DOB: 10/16/1985, 29 y.o.   MRN: [id number]  HPI She complains of a six-month history of right thumb pain. The pain is worse when she lifts an object. She also notes more pain in the morning. She does not describe any numbness, tingling or weakness. She has been seen here as well as an emergency room and urgent care. She was told that this was CTS.   Review of Systems     Objective:   Physical Exam Pain on gripping the at the base of the thumb. Slight tenderness palpation at the MCP joint. Negative Tinel's and Phalen's test. Normal strength.       Assessment & Plan:  Thumb pain, right I explained that this is more of a joint related issue. 20 mg of Kenalog and 1 mL of Xylocaine was injected into the first carpal metacarpal joint.difficulty. She obtained relief of her symptoms. At the end of the encounter , then mentioned getting a renewal on her phentermine and also wanting Xanax. I recommended that she make a follow-up appointment.

## 2014-10-02 ENCOUNTER — Institutional Professional Consult (permissible substitution): Payer: Medicaid Other | Admitting: Family Medicine

## 2014-10-06 ENCOUNTER — Institutional Professional Consult (permissible substitution): Payer: Medicaid Other | Admitting: Family Medicine

## 2014-10-07 ENCOUNTER — Encounter: Payer: Self-pay | Admitting: Family Medicine

## 2014-10-24 ENCOUNTER — Encounter (HOSPITAL_COMMUNITY): Payer: Self-pay | Admitting: Emergency Medicine

## 2014-10-24 ENCOUNTER — Emergency Department (HOSPITAL_COMMUNITY)
Admission: EM | Admit: 2014-10-24 | Discharge: 2014-10-24 | Disposition: A | Discharge status: Home / Self Care | Payer: Managed Care, Other (non HMO) | Attending: Emergency Medicine | Admitting: Emergency Medicine

## 2014-10-24 DIAGNOSIS — R252 Cramp and spasm: Secondary | ICD-10-CM | POA: Diagnosis not present

## 2014-10-24 DIAGNOSIS — E860 Dehydration: Secondary | ICD-10-CM | POA: Diagnosis not present

## 2014-10-24 DIAGNOSIS — Z79899 Other long term (current) drug therapy: Secondary | ICD-10-CM | POA: Diagnosis not present

## 2014-10-24 DIAGNOSIS — E119 Type 2 diabetes mellitus without complications: Secondary | ICD-10-CM | POA: Diagnosis not present

## 2014-10-24 DIAGNOSIS — Z3202 Encounter for pregnancy test, result negative: Secondary | ICD-10-CM | POA: Diagnosis not present

## 2014-10-24 DIAGNOSIS — Z791 Long term (current) use of non-steroidal anti-inflammatories (NSAID): Secondary | ICD-10-CM | POA: Insufficient documentation

## 2014-10-24 DIAGNOSIS — E669 Obesity, unspecified: Secondary | ICD-10-CM | POA: Diagnosis not present

## 2014-10-24 DIAGNOSIS — Z8709 Personal history of other diseases of the respiratory system: Secondary | ICD-10-CM | POA: Diagnosis not present

## 2014-10-24 DIAGNOSIS — M79604 Pain in right leg: Secondary | ICD-10-CM | POA: Diagnosis present

## 2014-10-24 LAB — I-STAT CHEM 8, ED
BUN: 8 mg/dL (ref 6–20)
CALCIUM ION: 1.18 mmol/L (ref 1.12–1.23)
CREATININE: 0.8 mg/dL (ref 0.44–1.00)
Chloride: 104 mmol/L (ref 101–111)
GLUCOSE: 104 mg/dL — AB (ref 70–99)
HEMATOCRIT: 44 % (ref 36.0–46.0)
Hemoglobin: 15 g/dL (ref 12.0–15.0)
POTASSIUM: 3.7 mmol/L (ref 3.5–5.1)
Sodium: 140 mmol/L (ref 135–145)
TCO2: 23 mmol/L (ref 0–100)

## 2014-10-24 LAB — URINALYSIS, ROUTINE W REFLEX MICROSCOPIC
Bilirubin Urine: NEGATIVE
Glucose, UA: NEGATIVE mg/dL
KETONES UR: NEGATIVE mg/dL
LEUKOCYTES UA: NEGATIVE
Nitrite: NEGATIVE
PROTEIN: NEGATIVE mg/dL
Specific Gravity, Urine: 1.016 (ref 1.005–1.030)
UROBILINOGEN UA: 0.2 mg/dL (ref 0.0–1.0)
pH: 6.5 (ref 5.0–8.0)

## 2014-10-24 LAB — PREGNANCY, URINE: PREG TEST UR: NEGATIVE

## 2014-10-24 LAB — URINE MICROSCOPIC-ADD ON

## 2014-10-24 NOTE — ED Notes (Signed)
Per EMS: Pt states she has been drinking all morning.  States that she has a dry mouth and throat x 2 hours.  Pt is currently not wearing any pants or underwear.

## 2014-10-24 NOTE — Discharge Instructions (Signed)
Continue rehydrating orally using Gatorade. Eat normal meals. Use caution with consuming alcohol. Return to the ER with any worsening of symptoms. Follow-up with your primary care doctor within the next week.   Leg Cramps Leg cramps that occur during exercise can be caused by poor circulation or dehydration. However, muscle cramps that occur at rest or during the night are usually not due to any serious medical problem. Heat cramps may cause muscle spasms during hot weather.  CAUSES There is no clear cause for muscle cramps. However, dehydration may be a factor for those who do not drink enough fluids and those who exercise in the heat. Imbalances in the level of sodium, potassium, calcium or magnesium in the muscle tissue may also be a factor. Some medications, such as water pills (diuretics), may cause loss of chemicals that the body needs (like sodium and potassium) and cause muscle cramps. TREATMENT   Make sure your diet has enough fluids and essential minerals for the muscle to work normally.  Avoid strenuous exercise for several days if you have been having frequent leg cramps.  Stretch and massage the cramped muscle for several minutes.  Some medicines may be helpful in some patients with night cramps. Only take over-the-counter or prescription medicines as directed by your caregiver. SEEK IMMEDIATE MEDICAL CARE IF:   Your leg cramps become worse.  Your foot becomes cold, numb, or blue. Document Released: 07/13/2004 Document Revised: 08/28/2011 Document Reviewed: 06/30/2008 [facility] Patient Information 2015 [place], [place]. This information is not intended to replace advice given to you by your health care provider. Make sure you discuss any questions you have with your health care provider.  Dehydration, Adult Dehydration is when you lose more fluids from the body than you take in. Vital organs like the kidneys, brain, and heart cannot function without a proper amount of fluids and  salt. Any loss of fluids from the body can cause dehydration.  CAUSES   Vomiting.  Diarrhea.  Excessive sweating.  Excessive urine output.  Fever. SYMPTOMS  Mild dehydration  Thirst.  Dry lips.  Slightly dry mouth. Moderate dehydration  Very dry mouth.  Sunken eyes.  Skin does not bounce back quickly when lightly pinched and released.  Dark urine and decreased urine production.  Decreased tear production.  Headache. Severe dehydration  Very dry mouth.  Extreme thirst.  Rapid, weak pulse (more than 100 beats per minute at rest).  Cold hands and feet.  Not able to sweat in spite of heat and temperature.  Rapid breathing.  Blue lips.  Confusion and lethargy.  Difficulty being awakened.  Minimal urine production.  No tears. DIAGNOSIS  Your caregiver will diagnose dehydration based on your symptoms and your exam. Blood and urine tests will help confirm the diagnosis. The diagnostic evaluation should also identify the cause of dehydration. TREATMENT  Treatment of mild or moderate dehydration can often be done at home by increasing the amount of fluids that you drink. It is best to drink small amounts of fluid more often. Drinking too much at one time can make vomiting worse. Refer to the home care instructions below. Severe dehydration needs to be treated at the hospital where you will probably be given intravenous (IV) fluids that contain water and electrolytes. HOME CARE INSTRUCTIONS   Ask your caregiver about specific rehydration instructions.  Drink enough fluids to keep your urine clear or pale yellow.  Drink small amounts frequently if you have nausea and vomiting.  Eat as you normally do.  Avoid:  Foods or drinks high in sugar.  Carbonated drinks.  Juice.  Extremely hot or cold fluids.  Drinks with caffeine.  Fatty, greasy foods.  Alcohol.  Tobacco.  Overeating.  Gelatin desserts.  Wash your hands well to avoid spreading  bacteria and viruses.  Only take over-the-counter or prescription medicines for pain, discomfort, or fever as directed by your caregiver.  Ask your caregiver if you should continue all prescribed and over-the-counter medicines.  Keep all follow-up appointments with your caregiver. SEEK MEDICAL CARE IF:  You have abdominal pain and it increases or stays in one area (localizes).  You have a rash, stiff neck, or severe headache.  You are irritable, sleepy, or difficult to awaken.  You are weak, dizzy, or extremely thirsty. SEEK IMMEDIATE MEDICAL CARE IF:   You are unable to keep fluids down or you get worse despite treatment.  You have frequent episodes of vomiting or diarrhea.  You have blood or green matter (bile) in your vomit.  You have blood in your stool or your stool looks black and tarry.  You have not urinated in 6 to 8 hours, or you have only urinated a small amount of very dark urine.  You have a fever.  You faint. MAKE SURE YOU:   Understand these instructions.  Will watch your condition.  Will get help right away if you are not doing well or get worse. Document Released: 06/05/2005 Document Revised: 08/28/2011 Document Reviewed: 01/23/2011 [facility] Patient Information 2015 [place], [place]. This information is not intended to replace advice given to you by your health care provider. Make sure you discuss any questions you have with your health care provider.

## 2014-10-24 NOTE — ED Notes (Signed)
Patient given water

## 2014-10-24 NOTE — ED Notes (Signed)
Pt c/o dry mouth, difficulty making tears and urinating small amounts

## 2014-10-24 NOTE — ED Provider Notes (Signed)
CSN: [id number]     Arrival date & time 10/24/14  1541 History  This chart was scribed for non-physician practitioner, Ladona MowJoe Zeno Hickel, PA-C,working with Pricilla LovelessScott Goldston, MD, by Karle PlumberJennifer Tensley, ED Scribe. This patient was seen in room WTR6/WTR6 and the patient's care was started at 4:41 PM.  Chief Complaint  Patient presents with  . "Dry Throat"    . Leg Pain    cramping in both lower legs x 12 hours   The history is provided by the patient and medical records. No language interpreter was used.    HPI Comments:  Deborah Little is a 29 y.o.obese female brought in by EMS, who presents to the Emergency Department complaining of right-sided leg cramping in her right calf that began last night. She reports drinking alcohol last night for about ten hours and states she feels like she may be dehydrated. She states she has not been drinking a lot of water with the alcohol. She reports associated nausea and indigestion with frequent belching this morning which has since resolved completely. She has not done anything to treat her symptoms. Denies modifying factors. Denies numbness, tingling or weakness of the lower extremities. Patient denies leg swelling, history of DVT or PE. Patient denies chest pain, shortness of breath, nausea, vomiting, abdominal pain, fever. Patient reports associated sensation of dryness in her mouth.  Past Medical History  Diagnosis Date  . Environmental allergies     prior testing 2008;  Dr. Ecru CallasSharma  . Obesity   . IUD (intrauterine device) in place 06/2010    mirena  . Diabetes mellitus without complication    Past Surgical History  Procedure Laterality Date  . No past surgeries    . Wisdom tooth extraction     Family History  Problem Relation Age of Onset  . Swallowing difficulties Brother   . Stroke Maternal Grandmother   . Diabetes Neg Hx   . Heart disease Neg Hx   . Hypertension Father    History  Substance Use Topics  . Smoking status: Never Smoker   . Smokeless  tobacco: Never Used  . Alcohol Use: Yes     Comment: socially   OB History    Gravida Para Term Preterm AB TAB SAB Ectopic Multiple Living   1 1 1  0 0 0 0 0 0 1     Review of Systems  Constitutional: Negative for fever.  HENT: Negative for sore throat and trouble swallowing.   Eyes: Negative for visual disturbance.  Respiratory: Negative for shortness of breath.   Cardiovascular: Negative for chest pain.  Gastrointestinal: Negative for nausea, vomiting and abdominal pain.  Genitourinary: Negative for dysuria.  Musculoskeletal: Negative for neck pain.  Skin: Negative for rash.  Neurological: Negative for dizziness, weakness and numbness.  Psychiatric/Behavioral: Negative.     Allergies  Review of patient's allergies indicates no known allergies.  Home Medications   Prior to Admission medications   Medication Sig Start Date End Date Taking? Authorizing Provider  levonorgestrel (MIRENA) 20 MCG/24HR IUD 1 each by Intrauterine route continuous.    Historical Provider, MD  naproxen (NAPROSYN) 375 MG tablet Take 1 tablet (375 mg total) by mouth 2 (two) times daily with a meal. 07/21/14   Jac Canavanavid S Tysinger, PA-C  phentermine (ADIPEX-P) 37.5 MG tablet Take 1 tablet (37.5 mg total) by mouth daily before breakfast. 07/28/14   Jac Canavanavid S Tysinger, PA-C   Triage Vitals: BP 123/73 mmHg  Pulse 93  Temp(Src) 99.1 F (37.3 C) (Oral)  Resp 18  SpO2 100% Physical Exam  Constitutional: She is oriented to person, place, and time. She appears well-developed and well-nourished. No distress.  HENT:  Head: Normocephalic and atraumatic.  Mouth/Throat: Oropharynx is clear and moist. No oropharyngeal exudate.  Eyes: EOM are normal. Right eye exhibits no discharge. Left eye exhibits no discharge. No scleral icterus.  Neck: Normal range of motion.  Cardiovascular: Normal rate, regular rhythm, S1 normal, S2 normal and normal heart sounds.  Exam reveals no gallop and no friction rub.   No murmur  heard. Pulses:      Dorsalis pedis pulses are 2+ on the right side, and 2+ on the left side.  Pulmonary/Chest: Effort normal and breath sounds normal. No respiratory distress. She has no wheezes. She has no rales.  Abdominal: Soft. There is no tenderness.  Musculoskeletal: Normal range of motion. She exhibits no edema or tenderness.  Right calf with no erythema, swelling or warmth. Mild tenderness to palpation of posterior calf. Homan sign negative. Muscle cramp able to be reproduced with plantar flexion of foot. DP pulse 2+. Motor strength 5 out of 5 at hip, knee, ankle. Distal sensation intact.  Neurological: She is alert and oriented to person, place, and time. No cranial nerve deficit. Coordination normal.  Distal sensations intact.   Skin: Skin is warm and dry. No rash noted. She is not diaphoretic.  Psychiatric: She has a normal mood and affect. Her behavior is normal.  Nursing note and vitals reviewed.   ED Course  Procedures (including critical care time) DIAGNOSTIC STUDIES: Oxygen Saturation is 100% on RA, normal by my interpretation.   COORDINATION OF CARE: 4:46 PM- Will order labs. Pt verbalizes understanding and agrees to plan.  Medications - No data to display  Labs Review Labs Reviewed  URINALYSIS, ROUTINE W REFLEX MICROSCOPIC - Abnormal; Notable for the following:    Hgb urine dipstick TRACE (*)    All other components within normal limits  I-STAT CHEM 8, ED - Abnormal; Notable for the following:    Glucose, Bld 104 (*)    All other components within normal limits  PREGNANCY, URINE  URINE MICROSCOPIC-ADD ON    Imaging Review No results found.   EKG Interpretation None      MDM   Final diagnoses:  Cramps of right lower extremity    Patient here with signs and symptoms consistent with mild dehydration after excessive alcohol use last night. Patient symptoms have mostly resolved at this time. Patient's lab work is unremarkable for acute pathology, likely  signs and symptoms are due to dehydration. Patient tolerating by mouth fluid well here, we'll discharge patient at this time. Patient is afebrile, hemodynamically stable and in no acute distress. There is no evidence of end organ damage. Renal function tach, electrolyte within normal limits. Cramping likely also due to dehydration. Patient discharged, and strongly encouraged to continue oral rehydration therapy. Patient also encouraged to follow-up with her memory care provider. Return precautions discussed, patient verbalizes understanding and agreement of this plan.  I personally performed the services described in this documentation, which was scribed in my presence. The recorded information has been reviewed and is accurate.  BP 123/73 mmHg  Pulse 94  Temp(Src) 99.1 F (37.3 C) (Oral)  Resp 18  SpO2 100%  Signed,  Ladona MowJoe Roanne Haye, PA-C 9:25 PM  Patient discussed with Dr. Pricilla LovelessScott Goldston, M.D.   Ladona MowJoe Unknown Flannigan, PA-C 10/24/14 2126  Pricilla LovelessScott Goldston, MD 10/27/14 1537

## 2014-10-26 ENCOUNTER — Encounter: Payer: Self-pay | Admitting: Family Medicine

## 2014-10-26 ENCOUNTER — Ambulatory Visit (INDEPENDENT_AMBULATORY_CARE_PROVIDER_SITE_OTHER): Payer: Medicaid Other | Admitting: Family Medicine

## 2014-10-26 VITALS — BP 100/78 | HR 90 | Wt 269.8 lb

## 2014-10-26 DIAGNOSIS — R252 Cramp and spasm: Secondary | ICD-10-CM | POA: Diagnosis not present

## 2014-10-26 NOTE — Progress Notes (Signed)
   Subjective:    Patient ID: Deborah CapuchinMichaela S Little, female    DOB: 10/17/1985, 29 y.o.   MRN: [id number]  HPI She is here for consult concerning recent trip to the emergency room for evaluation of tingling sensation in both feet mainly second through fifth toes. She also is having some bilateral calf cramping and tightness. She then stated something about having a rapid heart rate but no chest pain, shortness of breath. She did not check her heart rate and did not related to her other symptoms. The emergency room record was reviewed and nothing significant was found.   Review of Systems     Objective:   Physical Exam Alert and in no distress. Cardiac exam shows regular rhythm without murmurs or gallop. Lungs are clear to auscultation. Lower extremity exam shows negative Homans sign. No point tenderness. Pulses and reflexes normal. Strength normal.       Assessment & Plan:  Cramp of both lower extremities  I explained that I did not find anything significant specifically nothing to be concerned about. She was comfortable with this. Expressed the need for her to call before going to the emergency room.

## 2014-12-15 ENCOUNTER — Ambulatory Visit (INDEPENDENT_AMBULATORY_CARE_PROVIDER_SITE_OTHER): Payer: Medicaid Other | Admitting: Medical

## 2014-12-15 ENCOUNTER — Telehealth: Payer: Self-pay | Admitting: Medical

## 2014-12-15 ENCOUNTER — Encounter: Payer: Self-pay | Admitting: Medical

## 2014-12-15 VITALS — BP 88/60 | HR 91 | Temp 99.0°F | Resp 14 | Wt 272.0 lb

## 2014-12-15 DIAGNOSIS — R531 Weakness: Secondary | ICD-10-CM

## 2014-12-15 DIAGNOSIS — M545 Low back pain: Secondary | ICD-10-CM | POA: Diagnosis not present

## 2014-12-15 DIAGNOSIS — R112 Nausea with vomiting, unspecified: Secondary | ICD-10-CM | POA: Diagnosis not present

## 2014-12-15 LAB — POCT URINALYSIS DIPSTICK
BILIRUBIN UA: NEGATIVE
GLUCOSE UA: NEGATIVE
Ketones, UA: NEGATIVE
Leukocytes, UA: NEGATIVE
NITRITE UA: NEGATIVE
Protein, UA: NEGATIVE
RBC UA: NEGATIVE
UROBILINOGEN UA: NEGATIVE
pH, UA: 6

## 2014-12-15 LAB — CBC WITH DIFFERENTIAL/PLATELET
Basophils Absolute: 0.1 10*3/uL (ref 0.0–0.1)
Basophils Relative: 1 % (ref 0–1)
Eosinophils Absolute: 0.2 10*3/uL (ref 0.0–0.7)
Eosinophils Relative: 3 % (ref 0–5)
HCT: 39.1 % (ref 36.0–46.0)
HEMOGLOBIN: 12.6 g/dL (ref 12.0–15.0)
LYMPHS ABS: 2.6 10*3/uL (ref 0.7–4.0)
LYMPHS PCT: 35 % (ref 12–46)
MCH: 27.9 pg (ref 26.0–34.0)
MCHC: 32.2 g/dL (ref 30.0–36.0)
MCV: 86.5 fL (ref 78.0–100.0)
MPV: 10.6 fL (ref 8.6–12.4)
Monocytes Absolute: 0.5 10*3/uL (ref 0.1–1.0)
Monocytes Relative: 7 % (ref 3–12)
Neutro Abs: 4.1 10*3/uL (ref 1.7–7.7)
Neutrophils Relative %: 54 % (ref 43–77)
PLATELETS: 311 10*3/uL (ref 150–400)
RBC: 4.52 MIL/uL (ref 3.87–5.11)
RDW: 14.3 % (ref 11.5–15.5)
WBC: 7.5 10*3/uL (ref 4.0–10.5)

## 2014-12-15 LAB — COMPREHENSIVE METABOLIC PANEL
ALBUMIN: 4 g/dL (ref 3.5–5.2)
ALT: 14 U/L (ref 0–35)
AST: 18 U/L (ref 0–37)
Alkaline Phosphatase: 50 U/L (ref 39–117)
BUN: 9 mg/dL (ref 6–23)
CO2: 23 meq/L (ref 19–32)
Calcium: 9.2 mg/dL (ref 8.4–10.5)
Chloride: 106 mEq/L (ref 96–112)
Creat: 0.77 mg/dL (ref 0.50–1.10)
Glucose, Bld: 92 mg/dL (ref 70–99)
Potassium: 3.9 mEq/L (ref 3.5–5.3)
Sodium: 141 mEq/L (ref 135–145)
TOTAL PROTEIN: 6.7 g/dL (ref 6.0–8.3)
Total Bilirubin: 0.3 mg/dL (ref 0.2–1.2)

## 2014-12-15 LAB — POCT URINE PREGNANCY: PREG TEST UR: NEGATIVE

## 2014-12-15 MED ORDER — ONDANSETRON HCL 4 MG PO TABS: 4.0000 mg | ORAL_TABLET | Freq: Three times a day (TID) | ORAL | Status: DC | PRN

## 2014-12-15 NOTE — Telephone Encounter (Signed)
error 

## 2014-12-15 NOTE — Progress Notes (Signed)
Subjective: Here for not feeling well, vomiting. Feels weak, been having some vomiting.   Saw what she thought was a string of blood in the vomit.  Been vomiting for 2 days, been feeling weak x 3 days.  Called gynecology Sunday 2 days ago, they told her to come here to primary care.  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   No abdominal pain.   Has had nausea.  Vomiting is not projectile. vomited 3 times the first day, vomited few times yesterday.  Vomited once this morning.  Taking OTC anti nausea medication, not pepto.  Feel flushed, hot, but no specific fever at home.Not particularly polydipsia.   Has had some polyuria. No diarrhea, no blood in stool.  No burning with urination, no blood in urine, no odor in urine.    No URI symptoms.   No recent weight loss, no chills or sweats.  No rash, no recent tick bite.   Boyfriend has had a cold, but no other sick contacts.  Used naprosyn the first day of feeling bad.  Doesn't check blood sugars, has hx/o borderline diabetes.  No vaginal discharge.  Had some pelvic discomfort, had IUD in place.   No breast tenderness.  No concern for STD.    Uses condoms every time, been with boyfriend x 13 months.  No other aggravating or relieving factors. No other complaint.   Past Medical History  Diagnosis Date  . Environmental allergies     prior testing 2008;  Dr. Mingo CallasSharma  . Obesity   . IUD (intrauterine device) in place 06/2010    mirena  . Diabetes mellitus without complication    ROS as in subjective  Objective: BP 102/70 mmHg  Pulse 79  Temp(Src) 99 F (37.2 C) (Oral)  Resp 14  Wt 272 lb (123.378 kg)  General appearance: alert, no distress, WD/WN HEENT: normocephalic, sclerae anicteric, TMs pearly, nares patent, no discharge or erythema, pharynx normal Oral cavity: MMM, no lesions Neck: supple, no lymphadenopathy, no thyromegaly, no masses Heart: RRR, normal S1, S2, no murmurs Lungs: CTA bilaterally, no  wheezes, rhonchi, or rales Abdomen: +bs, soft, mild RLQ and suprapubic tenderness, otherwise non tender, non distended, no masses, no hepatomegaly, no splenomegaly Back: nontender Pulses: 2+ symmetric, upper and lower extremities, normal cap refill Ext: no edema Neuro: nonfocal Gyn and rectal - deferred   Assessment: Encounter Diagnoses  Name Primary?  . Generalized weakness Yes  . Nausea and vomiting, vomiting of unspecified type   . Low back pain without sciatica, unspecified back pain laterality     Plan: Discussed symptoms, concerns.   UA with microscopic blood.   Urine pregnancy negative.    Labs today at her reuqest.  Advised rest, good hydration, zofran for nausea, and we will call with lab results.  If any additional blood in vomit, go to the ED.   If any change in symptoms, call or return.

## 2015-07-08 ENCOUNTER — Ambulatory Visit (INDEPENDENT_AMBULATORY_CARE_PROVIDER_SITE_OTHER): Payer: Medicaid Other | Admitting: Family Medicine

## 2015-07-08 ENCOUNTER — Encounter: Payer: Self-pay | Admitting: Family Medicine

## 2015-07-08 VITALS — BP 130/88 | HR 68 | Temp 98.9°F | Wt 292.2 lb

## 2015-07-08 DIAGNOSIS — M25531 Pain in right wrist: Secondary | ICD-10-CM | POA: Diagnosis not present

## 2015-07-08 DIAGNOSIS — G8929 Other chronic pain: Secondary | ICD-10-CM

## 2015-07-08 MED ORDER — LIDOCAINE HCL 1 % IJ SOLN
1.0000 mL | Freq: Once | INTRAMUSCULAR | Status: AC
  Administered 2015-07-08: 1 mL

## 2015-07-08 MED ORDER — TRIAMCINOLONE ACETONIDE 40 MG/ML IJ SUSP
20.0000 mg | Freq: Once | INTRAMUSCULAR | Status: AC
  Administered 2015-07-08: 20 mg via INTRAMUSCULAR

## 2015-07-08 NOTE — Progress Notes (Signed)
   Subjective:    Patient ID: Deborah Little, female    DOB: [birth year], 30 y.o.   MRN: [id number]  HPI His here for a recheck. She is again having difficulty with pain at the base of the thumb. She has a previous history of difficulty with this and did respond quite nicely to an injection into that joint. She would like another one. She is getting ready to move and would like this taken care of to make her move easier.  Review of Systems     Objective:   Physical Exam Alert and in no distress. Exam of the thumb does show some tenderness palpation over the MCP joint but no swelling. Normal motion.       Assessment & Plan:  Wrist pain, chronic, right - Plan: lidocaine (XYLOCAINE) 1 % (with pres) injection 1 mL, triamcinolone acetonide (KENALOG-40) injection 20 mg The joint space was identified and marked with an X. The area was cleaned with Betadine. 29 the onset of Kenalog and 1 mL of Xylocaine was injected into this with almost immediate relief of her symptoms. Explained that if this continues, I will refer as I do not want to continue to give her shots.

## 2015-07-16 ENCOUNTER — Encounter: Payer: Self-pay | Admitting: Family Medicine

## 2015-07-16 ENCOUNTER — Ambulatory Visit (INDEPENDENT_AMBULATORY_CARE_PROVIDER_SITE_OTHER): Payer: Medicaid Other | Admitting: Family Medicine

## 2015-07-16 VITALS — BP 122/80 | HR 64 | Wt 289.2 lb

## 2015-07-16 DIAGNOSIS — M7989 Other specified soft tissue disorders: Secondary | ICD-10-CM | POA: Diagnosis not present

## 2015-07-16 NOTE — Progress Notes (Signed)
   Subjective:    Patient ID: Deborah Little, female    DOB: 04/09/1986, 30 y.o.   MRN: [id number]  HPI Chief Complaint  Patient presents with  . hand issues    hand issues. given a shot last week by dr. Susann Givens but thumb side is bigger than it was. swelling and hurts to drive or type on computer   She is here with complaints of swelling and tenderness to base of right thumb for past 5 days. She states she received an injection to her right wrist approximately 10 days ago and that she has had increased swelling since then, this is not where the shot was given however.  She reports normal sensation. Denies pain to hand but continues to have sharp shooting pains to right forearm, same as before the injection.  Denies fever, chills, numbness, tingling, weakness.  Has not taken any medications for discomfort.    Review of Systems Pertinent positives and negatives in the history of present illness.     Objective:   Physical Exam BP 122/80 mmHg  Pulse 64  Wt 289 lb 3.2 oz (131.18 kg)  Alert and oriented and in no acute distress. Right hand with edema to the thenar eminence, normal cap refill, color, sensation, ROM, equal grip. Mild tenderness to thenar eminence. Able to make OK sign without difficulty. Rest of right hand is normal.       Assessment & Plan:  Swelling of right hand  Recommend that she take 2 Aleve twice daily with food for the next few days, and use heat 20 minutes at a time twice daily. Also recommend keeping her right hand elevated above her heart when possible. She will call our office Monday if she is not noticing any improvement.

## 2015-07-16 NOTE — Patient Instructions (Signed)
Take 2 Aleve twice daily with food, use heat to the area 20 minutes at a time. Keep hand elevated as much as you can and let us know Monday if not improving.

## 2015-08-13 ENCOUNTER — Encounter: Payer: Self-pay | Admitting: Medical

## 2015-08-13 ENCOUNTER — Ambulatory Visit (INDEPENDENT_AMBULATORY_CARE_PROVIDER_SITE_OTHER): Payer: Medicaid Other | Admitting: Medical

## 2015-08-13 VITALS — BP 120/80 | HR 72 | Temp 99.0°F | Resp 16 | Wt 290.0 lb

## 2015-08-13 DIAGNOSIS — J069 Acute upper respiratory infection, unspecified: Secondary | ICD-10-CM

## 2015-08-13 MED ORDER — PROMETHAZINE-DM 6.25-15 MG/5ML PO SYRP: 5.0000 mL | ORAL_SOLUTION | Freq: Four times a day (QID) | ORAL | Status: DC | PRN

## 2015-08-13 NOTE — Progress Notes (Signed)
Subjective: Chief Complaint  Patient presents with  . Cough    still having bad cough has been taking mucinex, allergy medicine and is producing mucous. is feeling achy.    Here for bad cough, started 3 days ago.  She notes body aches, fatigue, sinus pressure, some colored mucous.  No sore throat, no NVD.   No fever.   No wheezing, no SOB.  Using mucinex since Tuesday.  Son had fever, chills last week. Has had 2 other recent sick contacts.   No other aggravating or relieving factors. No other complaint.   Past Medical History  Diagnosis Date  . Environmental allergies     prior testing 2008;  Dr. Franklin Callas  . Obesity   . IUD (intrauterine device) in place 06/2010    mirena  . Diabetes mellitus without complication (HCC)    ROS as in subject  Objective: BP 120/80 mmHg  Pulse 72  Temp(Src) 99 F (37.2 C) (Tympanic)  Resp 16  Wt 290 lb (131.543 kg)  LMP 08/11/2015  General appearance: alert, no distress, WD/WN HEENT: normocephalic, sclerae anicteric, TMs pearly, nares patent, no discharge or erythema, pharynx with mid erythema Oral cavity: MMM, no lesions Neck: supple, no lymphadenopathy, no thyromegaly, no masses Heart: RRR, normal S1, S2, no murmurs Lungs: CTA bilaterally, no wheezes, rhonchi, or rales   Assessment: Encounter Diagnosis  Name Primary?  . Acute upper respiratory infection Yes     Plan: Flu negative. Discussed symptoms, findings suggestive of viral URI.   Advised supportive care, can use medication below, and if worse or not improving in the next 3-4 days, call or return.

## 2015-08-17 LAB — POCT RAPID STREP A (OFFICE): Rapid Strep A Screen: NEGATIVE

## 2015-08-17 NOTE — Addendum Note (Signed)
Addended by: Kieth Brightly on: 08/17/2015 11:08 AM   Modules accepted: Orders

## 2015-08-19 ENCOUNTER — Ambulatory Visit (INDEPENDENT_AMBULATORY_CARE_PROVIDER_SITE_OTHER): Payer: BLUE CROSS/BLUE SHIELD | Admitting: Physician Assistant

## 2015-08-19 VITALS — BP 116/80 | HR 94 | Temp 99.3°F | Resp 16 | Ht 66.0 in | Wt 290.0 lb

## 2015-08-19 DIAGNOSIS — N898 Other specified noninflammatory disorders of vagina: Secondary | ICD-10-CM | POA: Diagnosis not present

## 2015-08-19 DIAGNOSIS — A5901 Trichomonal vulvovaginitis: Secondary | ICD-10-CM | POA: Diagnosis not present

## 2015-08-19 DIAGNOSIS — Z975 Presence of (intrauterine) contraceptive device: Secondary | ICD-10-CM | POA: Diagnosis not present

## 2015-08-19 LAB — POCT WET + KOH PREP
Yeast by KOH: ABSENT
Yeast by wet prep: ABSENT

## 2015-08-19 MED ORDER — METRONIDAZOLE 500 MG PO TABS: 2000.0000 mg | ORAL_TABLET | Freq: Two times a day (BID) | ORAL | Status: AC

## 2015-08-19 NOTE — Progress Notes (Signed)
   Deborah Little  MRN: [id number] DOB: [birth year]  Subjective:  Pt presents to clinic with vaginal discharge that started over the last 2 days.      Mirena was placed in 06/2011 - no problems with it - has always been able to feel her string but could not feel it after the discharge started and that worried her.  LMP - about a week - normal menses  Sexual partner - no change  There are no active problems to display for this patient.   Current Outpatient Prescriptions on File Prior to Visit  Medication Sig Dispense Refill  . levonorgestrel (MIRENA) 20 MCG/24HR IUD 1 each by Intrauterine route continuous.    . promethazine-dextromethorphan (PROMETHAZINE-DM) 6.25-15 MG/5ML syrup Take 5 mLs by mouth 4 (four) times daily as needed for cough. (Patient not taking: Reported on 08/19/2015) 120 mL 0   No current facility-administered medications on file prior to visit.    No Known Allergies  Review of Systems  Constitutional: Negative for fever and chills.  Gastrointestinal: Negative for abdominal pain.  Genitourinary: Positive for vaginal discharge. Negative for dysuria, urgency, frequency, menstrual problem and pelvic pain.   Objective:  BP 116/80 mmHg  Pulse 94  Temp(Src) 99.3 F (37.4 C) (Oral)  Resp 16  Ht  (1.676 m)  Wt 290 lb (131.543 kg)  BMI 46.83 kg/m2  SpO2 97%  LMP 08/11/2015  Physical Exam  Constitutional: She is oriented to person, place, and time and well-developed, well-nourished, and in no distress.  HENT:  Head: Normocephalic and atraumatic.  Right Ear: Hearing and external ear normal.  Left Ear: Hearing and external ear normal.  Eyes: Conjunctivae are normal.  Neck: Normal range of motion.  Cardiovascular: Normal rate, regular rhythm and normal heart sounds.   Pulmonary/Chest: Effort normal and breath sounds normal. She has no wheezes.  Abdominal: Soft. Bowel sounds are normal. There is no tenderness.  Genitourinary: Uterus normal, cervix normal, right  adnexa normal, left adnexa normal and vulva normal. Thin  odorless  yellow and vaginal discharge found.  IUD string present, mild CMT  Neurological: She is alert and oriented to person, place, and time. Gait normal.  Skin: Skin is warm and dry.  Psychiatric: Mood, memory, affect and judgment normal.  Vitals reviewed.  Results for orders placed or performed in visit on 08/19/15  POCT Wet + KOH Prep  Result Value Ref Range   Yeast by KOH Absent Present, Absent   Yeast by wet prep Absent Present, Absent   WBC by wet prep Many (A) None, Few, Too numerous to count   Clue Cells Wet Prep HPF POC None None, Too numerous to count   Trich by wet prep Present Present, Absent   Bacteria Wet Prep HPF POC Many (A) None, Few, Too numerous to count   Epithelial Cells By [employer] Pref ([facility]FC) Moderate (A) None, Few, Too numerous to count   RBC,UR,HPF,POC Few (A) None RBC/hpf    Assessment and Plan :  Vaginal discharge - Plan: POCT Wet + KOH Prep, GC/Chlamydia Probe Amp  IUD contraception  Trichomonal vaginitis - Plan: metroNIDAZOLE (FLAGYL) 500 MG tablet, Care order/instruction   Partner needs treatment.  IUD in place.  Benny Lennert PA-C  [facility] 08/19/2015 8:36 PM

## 2015-08-19 NOTE — Patient Instructions (Addendum)
Because you received labwork today, you will receive an invoice from [employer]. Please contact [facility] at [phone] with questions or concerns regarding your invoice. Our billing staff will not be able to assist you with those questions.  You will be contacted with the lab results as soon as they are available. The fastest way to get your results is to activate your My Chart account. Instructions are located on the last page of this paperwork. If you have not heard from [place] regarding the results in 2 weeks, please contact this office.  Trichomoniasis Trichomoniasis is an infection caused by an organism called Trichomonas. The infection can affect both women and men. In women, the outer female genitalia and the vagina are affected. In men, the penis is mainly affected, but the prostate and other reproductive organs can also be involved. Trichomoniasis is a sexually transmitted infection (STI) and is most often passed to another person through sexual contact.  RISK FACTORS  Having unprotected sexual intercourse.  Having sexual intercourse with an infected partner. SIGNS AND SYMPTOMS  Symptoms of trichomoniasis in women include:  Abnormal gray-green frothy vaginal discharge.  Itching and irritation of the vagina.  Itching and irritation of the area outside the vagina. Symptoms of trichomoniasis in men include:   Penile discharge with or without pain.  Pain during urination. This results from inflammation of the urethra. DIAGNOSIS  Trichomoniasis may be found during a Pap test or physical exam. Your health care provider may use one of the following methods to help diagnose this infection:  Testing the pH of the vagina with a test tape.  Using a vaginal swab test that checks for the Trichomonas organism. A test is available that provides results within a few minutes.  Examining a urine sample.  Testing vaginal secretions. Your health care provider may test  you for other STIs, including HIV. TREATMENT   You may be given medicine to fight the infection. Women should inform their health care provider if they could be or are pregnant. Some medicines used to treat the infection should not be taken during pregnancy.  Your health care provider may recommend over-the-counter medicines or creams to decrease itching or irritation.  Your sexual partner will need to be treated if infected.  Your health care provider may test you for infection again 3 months after treatment. HOME CARE INSTRUCTIONS   Take medicines only as directed by your health care provider.  Take over-the-counter medicine for itching or irritation as directed by your health care provider.  Do not have sexual intercourse while you have the infection.  Women should not douche or wear tampons while they have the infection.  Discuss your infection with your partner. Your partner may have gotten the infection from you, or you may have gotten it from your partner.  Have your sex partner get examined and treated if necessary.  Practice safe, informed, and protected sex.  See your health care provider for other STI testing. SEEK MEDICAL CARE IF:   You still have symptoms after you finish your medicine.  You develop abdominal pain.  You have pain when you urinate.  You have bleeding after sexual intercourse.  You develop a rash.  Your medicine makes you sick or makes you throw up (vomit). MAKE SURE YOU:  Understand these instructions.  Will watch your condition.  Will get help right away if you are not doing well or get worse.   This information is not intended to replace advice given to  you by your health care provider. Make sure you discuss any questions you have with your health care provider.   Document Released: 11/29/2000 Document Revised: 06/26/2014 Document Reviewed: 03/17/2013 Elsevier Interactive Patient Education [employer]

## 2015-08-21 LAB — GC/CHLAMYDIA PROBE AMP
CT PROBE, AMP APTIMA: NOT DETECTED
GC Probe RNA: NOT DETECTED

## 2015-08-23 ENCOUNTER — Telehealth: Payer: Self-pay

## 2015-08-23 NOTE — Telephone Encounter (Signed)
Pt had labs done and is waiting for results. Please call [phone]

## 2015-08-24 NOTE — Telephone Encounter (Signed)
I sent her a mychart message that everything was normal.

## 2015-08-27 ENCOUNTER — Encounter: Payer: Self-pay | Admitting: Medical

## 2015-08-27 ENCOUNTER — Telehealth: Payer: Self-pay | Admitting: Family Medicine

## 2015-08-27 ENCOUNTER — Ambulatory Visit (INDEPENDENT_AMBULATORY_CARE_PROVIDER_SITE_OTHER): Payer: BLUE CROSS/BLUE SHIELD | Admitting: Medical

## 2015-08-27 VITALS — BP 110/80 | HR 71 | Wt 287.0 lb

## 2015-08-27 DIAGNOSIS — F329 Major depressive disorder, single episode, unspecified: Secondary | ICD-10-CM

## 2015-08-27 DIAGNOSIS — R4589 Other symptoms and signs involving emotional state: Secondary | ICD-10-CM

## 2015-08-27 DIAGNOSIS — F430 Acute stress reaction: Secondary | ICD-10-CM

## 2015-08-27 NOTE — Telephone Encounter (Signed)
Pt states her Psychiatrist office that she went to in the past is now requiring a referral.  Please do referral to [facility]

## 2015-08-27 NOTE — Telephone Encounter (Signed)
After talking with pt, made appt for this afternoon [facility]
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
## 2015-08-30 NOTE — Telephone Encounter (Signed)
Left VM informing pt mychart message was sent saying everything was normal.

## 2015-08-31 ENCOUNTER — Encounter: Payer: Self-pay | Admitting: Medical

## 2015-08-31 ENCOUNTER — Ambulatory Visit (INDEPENDENT_AMBULATORY_CARE_PROVIDER_SITE_OTHER): Payer: BLUE CROSS/BLUE SHIELD | Admitting: Medical

## 2015-08-31 VITALS — BP 130/92 | HR 89 | Wt 289.0 lb

## 2015-08-31 DIAGNOSIS — F329 Major depressive disorder, single episode, unspecified: Secondary | ICD-10-CM

## 2015-08-31 DIAGNOSIS — F430 Acute stress reaction: Secondary | ICD-10-CM

## 2015-08-31 DIAGNOSIS — R4589 Other symptoms and signs involving emotional state: Secondary | ICD-10-CM

## 2015-08-31 MED ORDER — CITALOPRAM HYDROBROMIDE 20 MG PO TABS: ORAL_TABLET | ORAL | Status: DC
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
## 2015-09-05 IMAGING — CR DG ANKLE COMPLETE 3+V*R*
3 series · 3 of 3 positions shown · non-contrast
Comparison: None.

CLINICAL DATA: Tripped while going down stairs, with twisting
injury to the right foot and anterior right ankle pain. Initial
encounter.

EXAM:
RIGHT ANKLE - COMPLETE 3+ VIEW

[x ankle ap right]
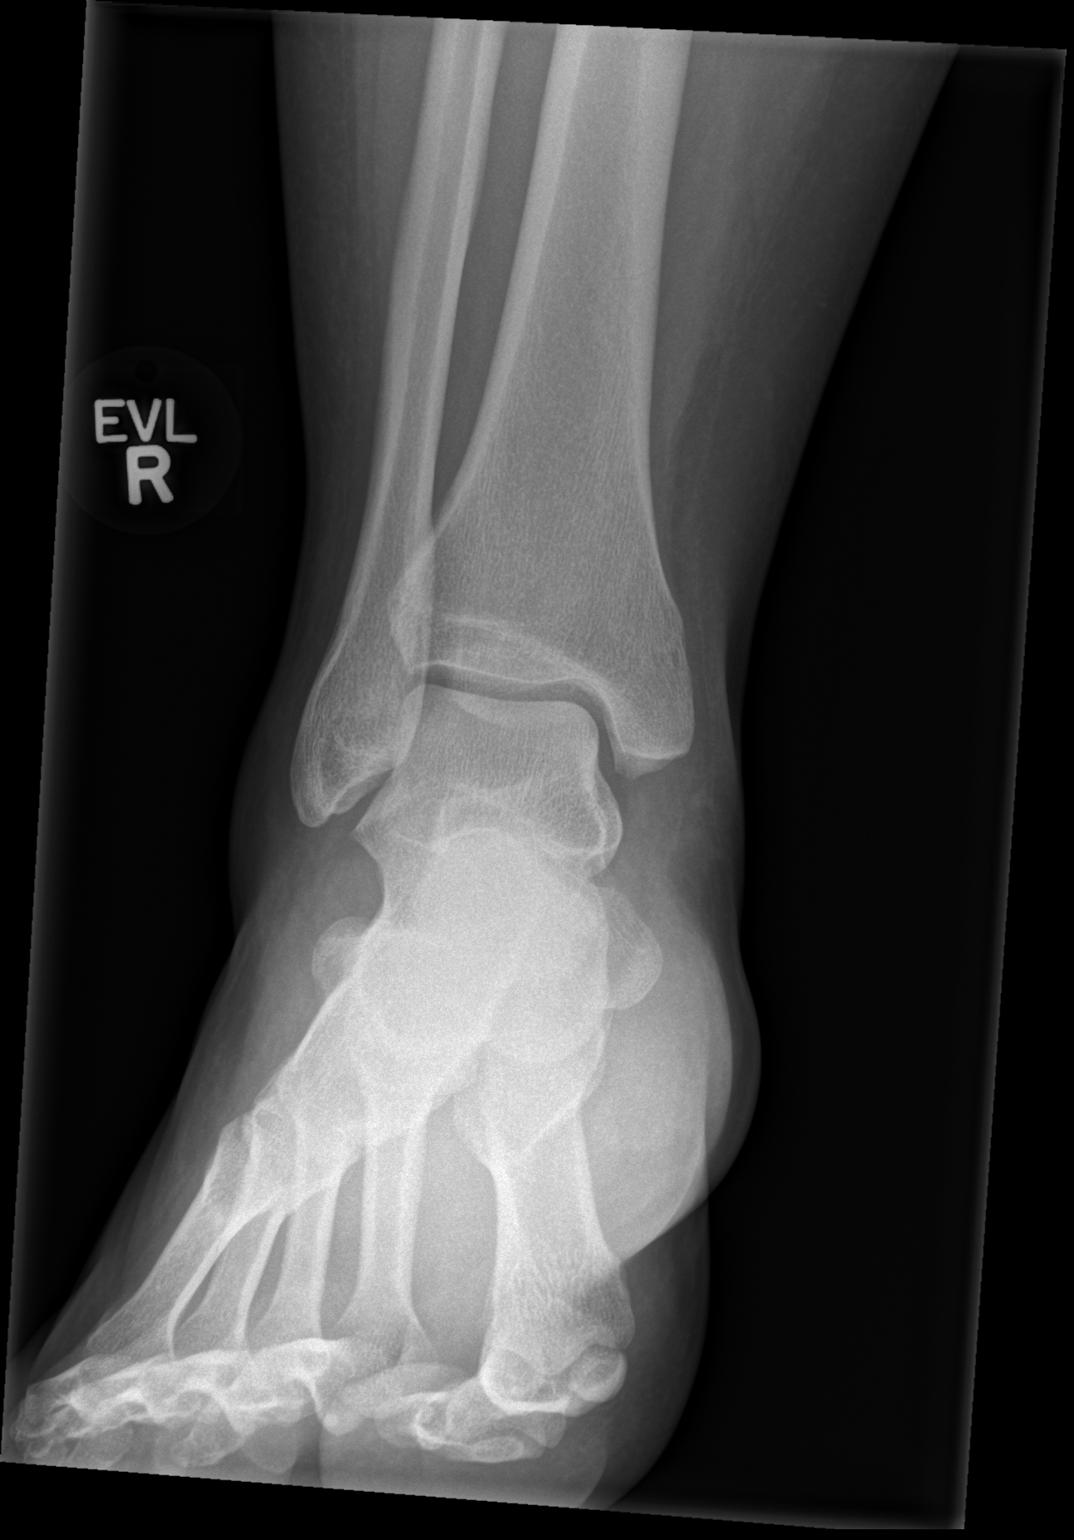

[x ankle obl right]
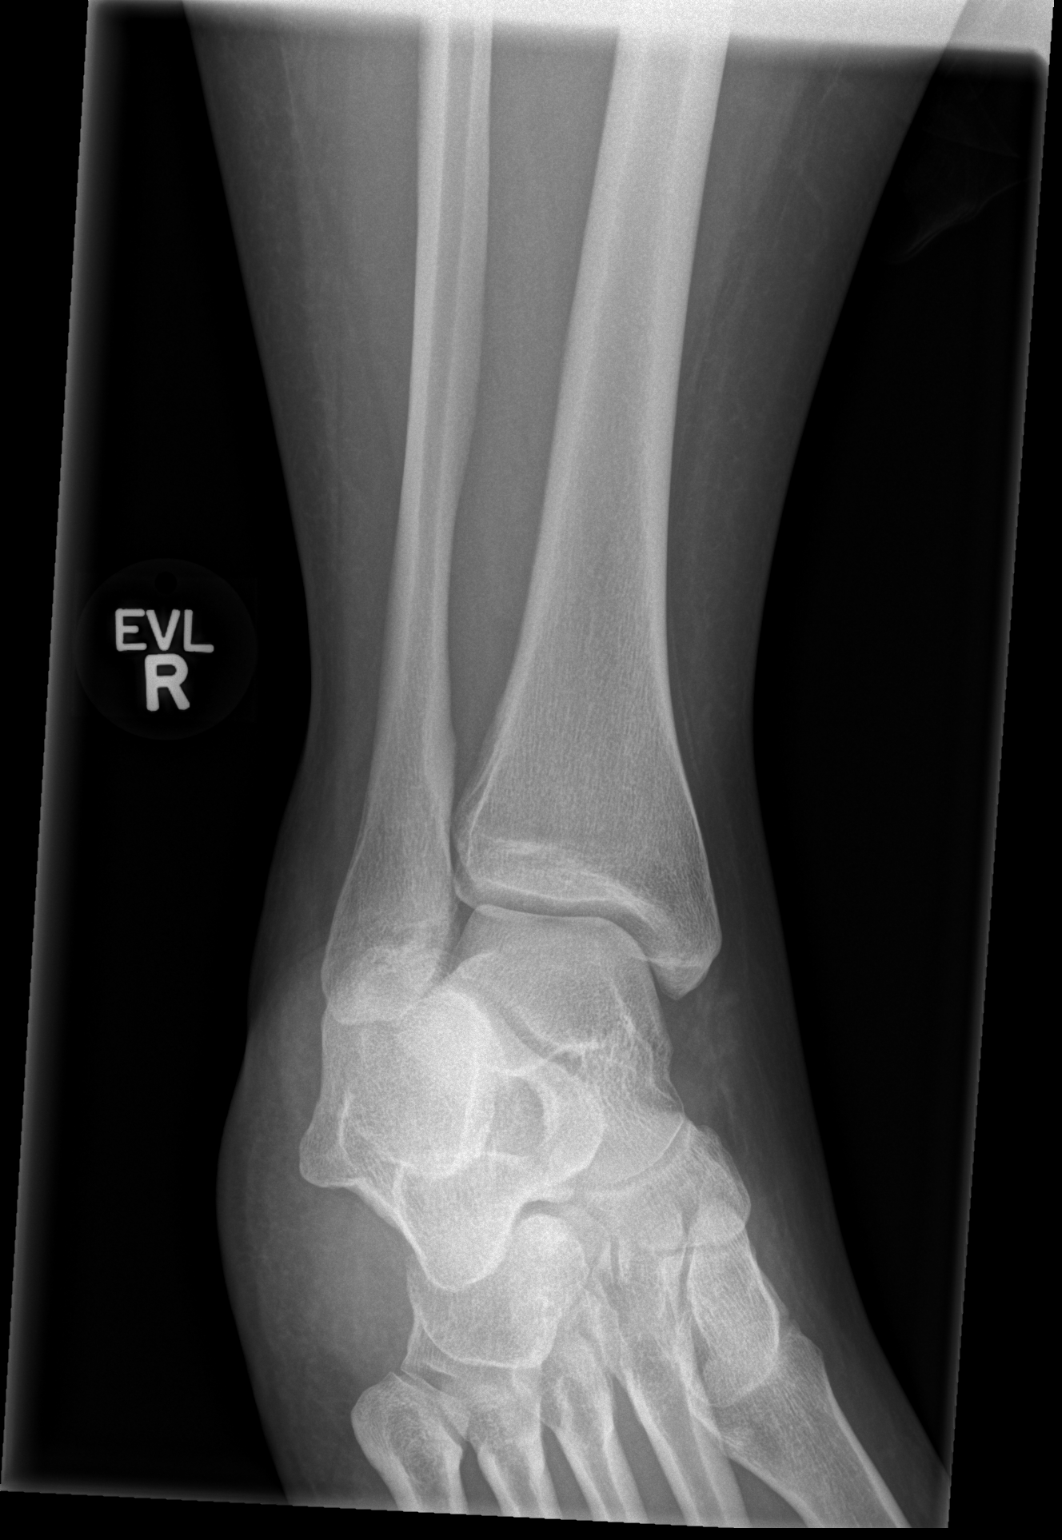

[x ankle lat right]
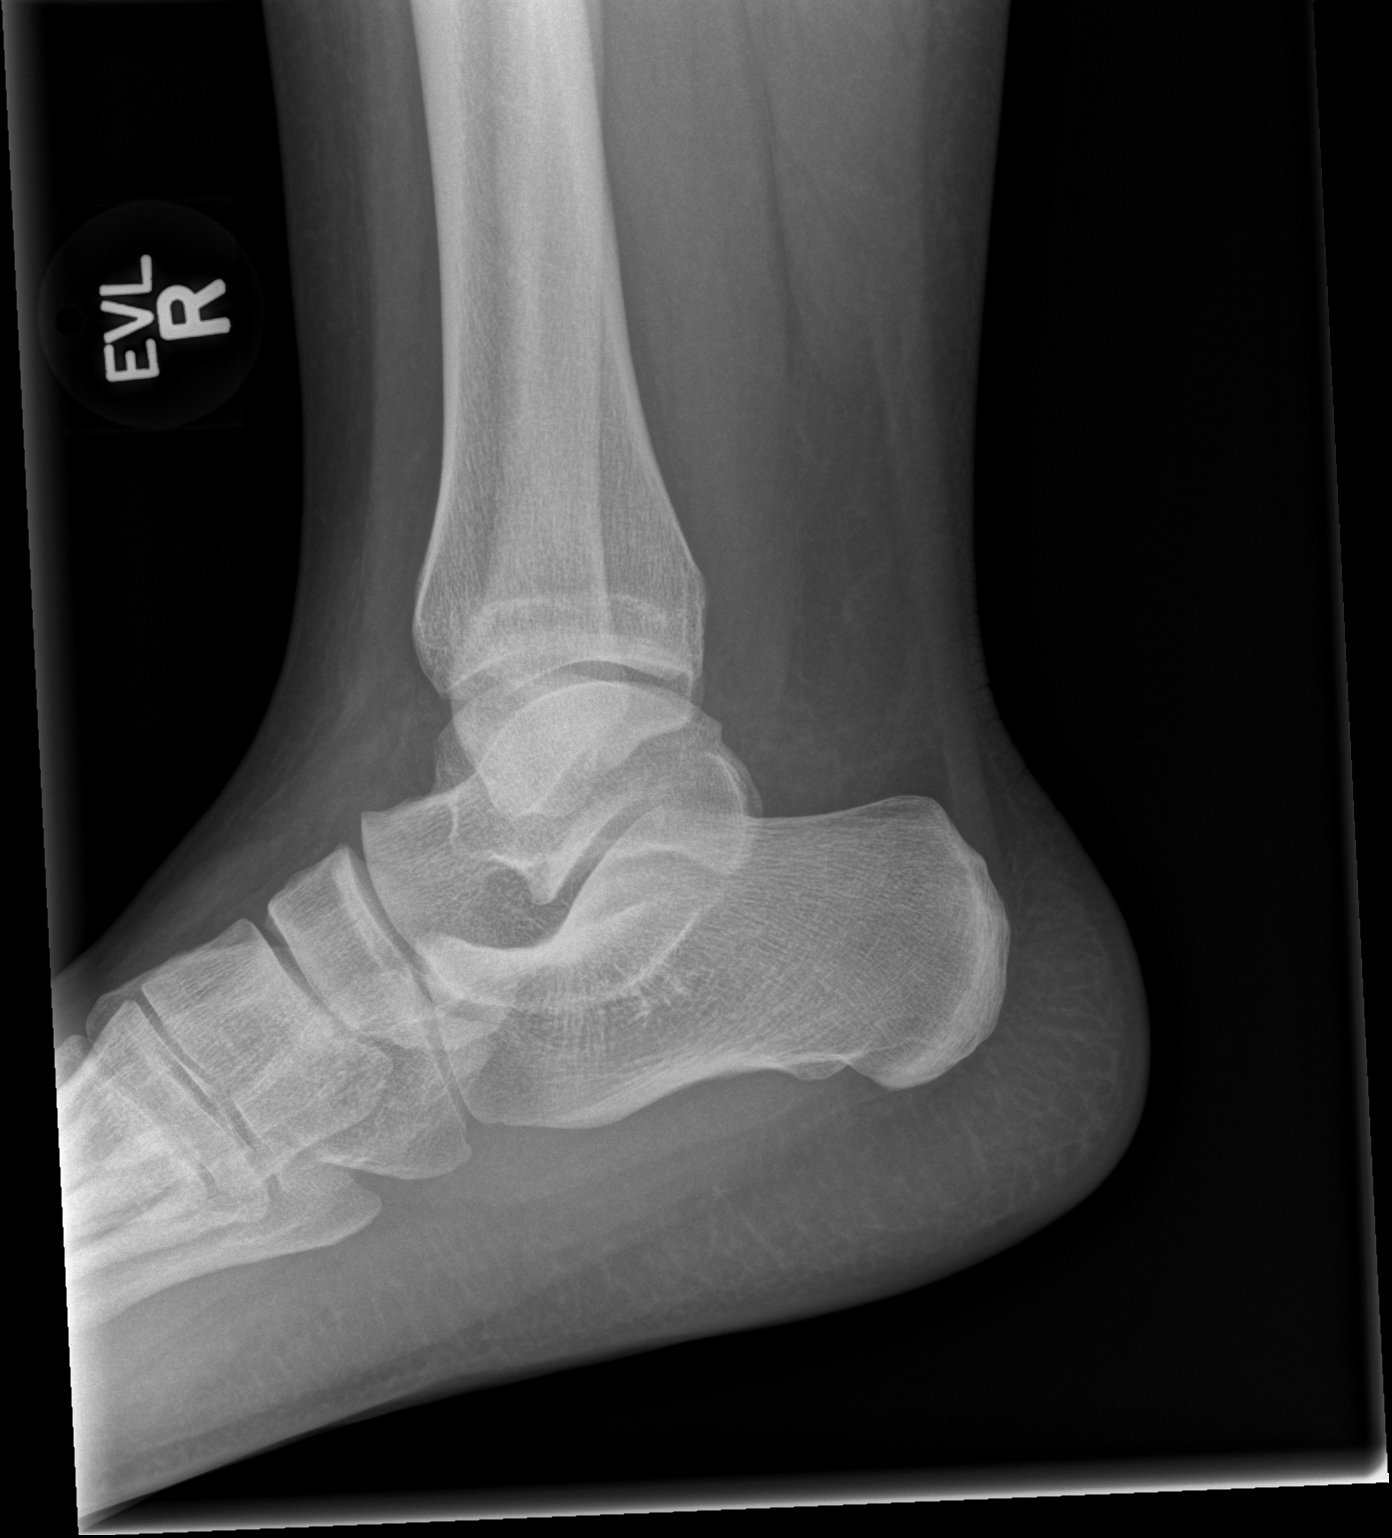

[3 of 3 positions shown; findings below may reference images not displayed]

FINDINGS: There is no evidence of fracture or dislocation. The ankle mortise
is intact; the interosseous space is within normal limits. No talar
tilt or subluxation is seen.

The joint spaces are preserved. No significant soft tissue
abnormalities are seen.
IMPRESSION: No evidence of fracture or dislocation.

## 2015-10-29 ENCOUNTER — Ambulatory Visit (INDEPENDENT_AMBULATORY_CARE_PROVIDER_SITE_OTHER): Payer: BLUE CROSS/BLUE SHIELD | Admitting: Medical

## 2015-10-29 ENCOUNTER — Encounter: Payer: Self-pay | Admitting: Medical

## 2015-10-29 VITALS — BP 120/86 | HR 94 | Wt 291.0 lb

## 2015-10-29 DIAGNOSIS — M25562 Pain in left knee: Secondary | ICD-10-CM

## 2015-10-29 DIAGNOSIS — R531 Weakness: Secondary | ICD-10-CM

## 2015-10-29 DIAGNOSIS — R202 Paresthesia of skin: Secondary | ICD-10-CM | POA: Diagnosis not present

## 2015-10-29 DIAGNOSIS — M25561 Pain in right knee: Secondary | ICD-10-CM | POA: Diagnosis not present

## 2015-10-29 DIAGNOSIS — M2550 Pain in unspecified joint: Secondary | ICD-10-CM

## 2015-10-29 DIAGNOSIS — H1013 Acute atopic conjunctivitis, bilateral: Secondary | ICD-10-CM | POA: Diagnosis not present

## 2015-10-29 DIAGNOSIS — G8929 Other chronic pain: Secondary | ICD-10-CM | POA: Diagnosis not present

## 2015-10-29 DIAGNOSIS — M2540 Effusion, unspecified joint: Secondary | ICD-10-CM

## 2015-10-29 DIAGNOSIS — M25531 Pain in right wrist: Secondary | ICD-10-CM

## 2015-10-29 DIAGNOSIS — J309 Allergic rhinitis, unspecified: Secondary | ICD-10-CM

## 2015-10-29 LAB — URIC ACID: URIC ACID, SERUM: 6 mg/dL (ref 2.4–7.0)

## 2015-10-29 MED ORDER — OLOPATADINE HCL 0.1 % OP SOLN: 1.0000 [drp] | Freq: Two times a day (BID) | OPHTHALMIC | Status: DC

## 2015-10-29 MED ORDER — FLUTICASONE PROPIONATE 50 MCG/ACT NA SUSP
2.0000 | Freq: Every day | NASAL | Status: DC
Start: 2015-10-29 — End: 2016-08-13

## 2015-10-29 NOTE — Progress Notes (Signed)
Subjective: Chief Complaint  Patient presents with  . Hand Pain    rt hand. the  injection helped. it is back in full force. states she is aching all over today. wants to discuss arthritis testing, since her joints all ache. is not fasting. wants an allergy medicine.    Here for right hand pain and other arthritis concerns.   Interested in labs.   Lingering in joints all day in several joints.   Lately been getting multiple joint pains including knees, both wrists, toes hurt, some in fingers.  Right wrist and thumb seem swollen now.  right knee seems swollen sometimes.  Does get joint stiffness, particular of late.   Gets some right hand numbness, usually in the last 4 fingers, but otherwise denies other hand or arm numbness, tingling, and has some general weaknesses.  No recent injury or trauma.    No fever.  No night sweats.   Has IUD in place.  Using some ibuprofen 800mg  prn.  Has used muscle relaxer every now and then. No family hx/o rheumatoid disease.  Has hx/o right tenosynovitis and Dr. Susann GivensLalonde has done 2 steroid injections for this in the past, last one 06/2015.   Wants to try something else for allergies.  Despite using allegra, still getting itchy water eyes and stuffy nose.    No other aggravating or relieving factors. No other complaint.   Past Medical History  Diagnosis Date  . Environmental allergies     prior testing 2008;  Dr.  CallasSharma  . Obesity   . IUD (intrauterine device) in place 06/2010    mirena  . Diabetes mellitus without complication [facility](HCC)     Past Surgical History  Procedure Laterality Date  . No past surgeries    . Wisdom tooth extraction     Family History  Problem Relation Age of Onset  . Swallowing difficulties Brother   . Stroke Maternal Grandmother   . Diabetes Neg Hx   . Heart disease Neg Hx   . Hypertension Father    ROS as in subjective    Objective: BP 120/86 mmHg  Pulse 94  Wt 291 lb (131.997 kg)  General appearance: alert, no distress,  WD/WN, obese AA female HEENT: normocephalic, sclerae anicteric, conjunctiva mildly inflamed, TMs pearly, nares with mild turbinate edema, clear discharge, pharynx normal Oral cavity: MMM, no lesions Neck: supple, no lymphadenopathy, no thyromegaly, no masses Heart: RRR, normal S1, S2, no murmurs Lungs: CTA bilaterally, no wheezes, rhonchi, or rales Pulses: 2+ symmetric, upper and lower extremities, normal cap refill Ext: no other edema MSK: tender with bilat hand squeeze test, tender over right thumb in general, right thenar prominence seems mildly swollen, tender over bilat wrists, mildly decreased right wrist ROM with flexion and extension, otherwise nontender, no obvious swelling or deformity.   No warmth Arms and legs with normal strength, sensation, DTRs, and negative phalens and tinels, but she is shaking hands several times in the visit claiming pain and numbness sensation      Assessment: Encounter Diagnoses  Name Primary?  . Joint swelling Yes  . Polyarthralgia   . Generalized weakness   . Knee pain, bilateral   . Wrist pain, chronic, right   . Allergic rhinitis, unspecified allergic rhinitis type   . Paresthesia   . Allergic conjunctivitis, bilateral      Plan: She has known tenosynovitis problems recurrent right wrist, likely some bilat CTS.   Advised night time reinforced wrist splints OTC.   Can c/t NSAID.  Rheum screening labs today.    If negative, refer to ortho.   F/u pending labs  Allergic rhinitis and conjunctivitis - c/t allegra, begin trial of Flonase and Patanol ophthalmic solution   Markelle was seen today for hand pain.  Diagnoses and all orders for this visit:  Joint swelling -     Cyclic citrul peptide antibody, IgG -     Sedimentation rate -     Uric acid -     ANA  Polyarthralgia -     Cyclic citrul peptide antibody, IgG -     Sedimentation rate -     Uric acid -     ANA  Generalized weakness -     Cyclic citrul peptide antibody, IgG -      Sedimentation rate -     Uric acid -     ANA  Knee pain, bilateral -     Cyclic citrul peptide antibody, IgG -     Sedimentation rate -     Uric acid -     ANA  Wrist pain, chronic, right -     Cyclic citrul peptide antibody, IgG -     Sedimentation rate -     Uric acid -     ANA  Allergic rhinitis, unspecified allergic rhinitis type  Paresthesia  Allergic conjunctivitis, bilateral  Other orders -     olopatadine (PATANOL) 0.1 % ophthalmic solution; Place 1 drop into both eyes 2 (two) times daily. -     fluticasone (FLONASE) 50 MCG/ACT nasal spray; Place 2 sprays into both nostrils daily.

## 2015-10-30 LAB — SEDIMENTATION RATE: Sed Rate: 11 mm/hr (ref 0–20)

## 2015-11-01 LAB — CYCLIC CITRUL PEPTIDE ANTIBODY, IGG: Cyclic Citrullin Peptide Ab: <16 Units

## 2015-11-02 ENCOUNTER — Telehealth: Payer: Self-pay | Admitting: Internal Medicine

## 2015-11-02 ENCOUNTER — Other Ambulatory Visit: Payer: Self-pay

## 2015-11-02 ENCOUNTER — Other Ambulatory Visit: Payer: Self-pay | Admitting: Medical

## 2015-11-02 ENCOUNTER — Telehealth: Payer: Self-pay | Admitting: Family Medicine

## 2015-11-02 LAB — ANTI-NUCLEAR AB-TITER (ANA TITER): ANA Titer 1: 1:40 {titer} — ABNORMAL HIGH

## 2015-11-02 LAB — ANA: Anti Nuclear Antibody(ANA): POSITIVE — AB

## 2015-11-02 MED ORDER — TRAMADOL HCL 50 MG PO TABS: 50.0000 mg | ORAL_TABLET | Freq: Four times a day (QID) | ORAL | Status: DC | PRN

## 2015-11-02 MED ORDER — DICLOFENAC SODIUM 75 MG PO TBEC: 75.0000 mg | DELAYED_RELEASE_TABLET | Freq: Two times a day (BID) | ORAL | Status: DC

## 2015-11-02 NOTE — Telephone Encounter (Signed)
Called med in 

## 2015-11-02 NOTE — Telephone Encounter (Signed)
Let her know that the results are not back in. Call in Voltaren 75 mg twice a day

## 2015-11-02 NOTE — Telephone Encounter (Signed)
Called in Voltaren tablets per [employer]

## 2015-11-02 NOTE — Telephone Encounter (Signed)
Pt called seeing if her test results were back and what they were. Pt is also in a lot of pain on her right hand. She has elevated, rested it, iced it and taking pain meds that she had but wants to know if she could get something for the pain. [facility]

## 2015-11-02 NOTE — Telephone Encounter (Signed)
See lab results message

## 2015-11-02 NOTE — Telephone Encounter (Signed)
Patient had called and left message for Deborah Little this morning and did not get a call back She wanted her lab results but also stated that she is in a lot of pain with her hand Patient very irritated that she was not called back and asked if I could send message to you

## 2015-11-08 ENCOUNTER — Telehealth: Payer: Self-pay

## 2015-11-08 NOTE — Telephone Encounter (Signed)
Pt called because she wanted some clarification on why we referred her and why we sent the medications that we did. Went over all with the patient. She stated she now understood.

## 2015-11-09 ENCOUNTER — Telehealth: Payer: Self-pay | Admitting: Family Medicine

## 2015-11-09 NOTE — Telephone Encounter (Signed)
She may want to call the office back as several of the offices have rheumatologist onsite and they usually do their own labs.   So I assume they want her to come back to the same office with the rheumatologist.

## 2015-11-09 NOTE — Telephone Encounter (Signed)
LMTCB

## 2015-11-09 NOTE — Telephone Encounter (Signed)
Pt wants Deborah Little to know that she went to Ortho doctor yesterday where she was referred however he was not able to help her. Ortho wants her to see a rheumatologist and to have get nerve test done and pt said ortho also mention that she will need to have labs and she thinks that he wants her to come here for labs but pt is confused about labs and what is needed.

## 2015-11-12 NOTE — Telephone Encounter (Signed)
I'll await FMLA forms unless she wants ortho or rheum to do them.

## 2015-11-12 NOTE — Telephone Encounter (Signed)
Pt states that they called her back and she is seeing [facility]. [facility]. Asked about FMLA forms for her job since she has had to miss so many days for the problems with her hands?

## 2015-11-12 NOTE — Telephone Encounter (Signed)
Pt is aware and is going to discuss with other offices before she decides.

## 2015-11-16 ENCOUNTER — Encounter (HOSPITAL_COMMUNITY): Payer: Self-pay

## 2015-11-16 ENCOUNTER — Telehealth: Payer: Self-pay | Admitting: Medical

## 2015-11-16 ENCOUNTER — Emergency Department (HOSPITAL_COMMUNITY)
Admission: EM | Admit: 2015-11-16 | Discharge: 2015-11-16 | Disposition: A | Discharge status: Home / Self Care | Payer: BLUE CROSS/BLUE SHIELD | Attending: Emergency Medicine | Admitting: Emergency Medicine

## 2015-11-16 DIAGNOSIS — Z79899 Other long term (current) drug therapy: Secondary | ICD-10-CM | POA: Insufficient documentation

## 2015-11-16 DIAGNOSIS — E119 Type 2 diabetes mellitus without complications: Secondary | ICD-10-CM | POA: Insufficient documentation

## 2015-11-16 DIAGNOSIS — M2550 Pain in unspecified joint: Secondary | ICD-10-CM | POA: Insufficient documentation

## 2015-11-16 DIAGNOSIS — M25531 Pain in right wrist: Secondary | ICD-10-CM | POA: Diagnosis present

## 2015-11-16 NOTE — ED Notes (Signed)
Pt came to triage desk requesting paperwork for discharge. Retrieved forms and went to d/c patient home.  Pt states she wanted to talk to someone b/c she is not going to pay the bill.  We did not do anything for her.  Pt had spoken to her RN and charge.  Notified [place], AD to speak with patient.  [place] spoke with patient at length regarding emergency room care and follow up. Pt discharged home.

## 2015-11-16 NOTE — ED Provider Notes (Signed)
CSN: [id number]     Arrival date & time 11/16/15  1357 History   First MD Initiated Contact with Patient 11/16/15 1417     Chief Complaint  Patient presents with  . Extremity Pain     (Consider location/radiation/quality/duration/timing/severity/associated sxs/prior Treatment) HPI   Flornce Record Kosa is a 30 y.o. female presents for the expressed purpose of getting laboratory evaluation for swollen and stiff joints. He has been bothered by generalized arthralgia, for 3 months. She is also had right hand stiffness for one year. She has been evaluated by her PCP, who treated her with cortisone injections in the right wrist, and is currently referred her to a orthopedic doctor. They recommended an evaluation by rheumatology, which has been scheduled, but has not done yet. She had some labs done to PCP office, which were not revealing for source of her problem. Today she dropped off FMLA papers at her PCP office, because she's been unable to work because of the discomfort. She denies fever, chills, nausea, vomiting, cough, shortness of breath or chest pain. There are no other known modifying factors.   Past Medical History  Diagnosis Date  . Environmental allergies     prior testing 2008;  Dr. [place] Callas  . Obesity   . IUD (intrauterine device) in place 06/2010    mirena  . Diabetes mellitus without complication [facility])    Past Surgical History  Procedure Laterality Date  . No past surgeries    . Wisdom tooth extraction     Family History  Problem Relation Age of Onset  . Swallowing difficulties Brother   . Stroke Maternal Grandmother   . Diabetes Neg Hx   . Heart disease Neg Hx   . Hypertension Father    Social History  Substance Use Topics  . Smoking status: Never Smoker   . Smokeless tobacco: Never Used  . Alcohol Use: Yes     Comment: socially   OB History    Gravida Para Term Preterm AB TAB SAB Ectopic Multiple Living   0 0 0 0 0 0 1     Review of Systems  All other  systems reviewed and are negative.     Allergies  Fish allergy  Home Medications   Prior to Admission medications   Medication Sig Start Date End Date Taking? Authorizing Provider  diclofenac (VOLTAREN) 75 MG EC tablet Take 1 tablet (75 mg total) by mouth 2 (two) times daily. Patient taking differently: Take 75 mg by mouth daily.  11/02/15  Yes Ronnald Nian, MD  levonorgestrel (MIRENA) 20 MCG/24HR IUD 1 each by Intrauterine route continuous.   Yes Historical Provider, MD  meloxicam (MOBIC) 15 MG tablet Take 15 mg by mouth daily.   Yes Historical Provider, MD  citalopram (CELEXA) 20 MG tablet 1/2 tablet po QHS x 1wk, then go to 1 tablet QHS Patient not taking: Reported on 11/16/2015 08/31/15   Kermit Balo Tysinger, PA-C  fluticasone (FLONASE) 50 MCG/ACT nasal spray Place 2 sprays into both nostrils daily. 10/29/15   Kermit Balo Tysinger, PA-C  olopatadine (PATANOL) 0.1 % ophthalmic solution Place 1 drop into both eyes 2 (two) times daily. 10/29/15   Kermit Balo Tysinger, PA-C  traMADol (ULTRAM) 50 MG tablet Take 1 tablet (50 mg total) by mouth every 6 (six) hours as needed. 11/02/15   Kermit Balo Tysinger, PA-C   BP 159/102 mmHg  Pulse 91  Temp(Src) 98 F (36.7 C) (Oral)  Resp 18  SpO2 100%  LMP 10/20/2015 Physical Exam  Constitutional: She is oriented to person, place, and time. She appears well-developed.  Obese  HENT:  Head: Normocephalic and atraumatic.  Right Ear: External ear normal.  Left Ear: External ear normal.  Eyes: Conjunctivae and EOM are normal. Pupils are equal, round, and reactive to light.  Neck: Normal range of motion and phonation normal. Neck supple.  Cardiovascular: Normal rate, regular rhythm and normal heart sounds.   Normal radial pulse and capillary refill, right wrist and fingers right hand.  Pulmonary/Chest: Effort normal and breath sounds normal. She exhibits no bony tenderness.  Abdominal: Soft. There is no tenderness.  Musculoskeletal: Normal range of motion.   Normal range of motion, arms and legs, hands and fingers. No evident asymmetric, joint enlargement.  Neurological: She is alert and oriented to person, place, and time. No cranial nerve deficit or sensory deficit. She exhibits normal muscle tone. Coordination normal.  Skin: Skin is warm, dry and intact.  Psychiatric: She has a normal mood and affect. Her behavior is normal. Judgment and thought content normal.  Nursing note and vitals reviewed.   ED Course  Procedures (including critical care time)  Initial clinical impression- nonspecific. Right hand discomfort and generalized arthralgia. Vital signs reveal minimal elevation of blood pressure. Trending of blood pressure results last several months reveal normal pressure.  I explained to the patient that she did not appear to have an emergency condition that would require further evaluation and treatment in the emergency department. He agreed to follow-up with her PCP. She thereupon stated that she did not think that she should pay for the evaluation that has been done so far. I asked the nurse to have her talk to someone that could help her with this concern. Discharge paperwork was filled out.   Labs Review Labs Reviewed - No data to display  Imaging Review No results found. I have personally reviewed and evaluated these images and lab results as part of my medical decision-making.   EKG Interpretation None      MDM   Final diagnoses:  Arthralgia    Nonemergent arthralgia without evidence for septic joint, bacterial infection or impending vascular collapse.  Nursing Notes Reviewed/ Care Coordinated Applicable Imaging Reviewed Interpretation of Laboratory Data incorporated into ED treatment  The patient appears reasonably screened and/or stabilized for discharge and I doubt any other medical condition or other [facility] requiring further screening, evaluation, or treatment in the ED at this time prior to discharge.  Plan: Home  Medications- usual; Home Treatments- rest; return here if the recommended treatment, does not improve the symptoms; Recommended follow up- PCP prn     Mancel BaleElliott Rondey Fallen, MD 11/16/15 1447

## 2015-11-16 NOTE — Telephone Encounter (Signed)
Pt came in and dropped off FMLA papers to be completed. Pt informed of fee. Sending back to be completed. Please call pt at [phone] when ready.

## 2015-11-16 NOTE — Discharge Instructions (Signed)

## 2015-11-16 NOTE — ED Notes (Signed)
Pt with multiple complaints.  States rt hand has had multiple episodes of hand swelling and has had injections.  Pt supposed to have nerve study but pain is getting worse.  Hand today is more in hand but complains of knees and shoulders.  Complaints of fatigue and eye pain as well.  Supposed to see rheumatologist for further testing.

## 2015-11-17 ENCOUNTER — Telehealth: Payer: Self-pay | Admitting: Family Medicine

## 2015-11-17 NOTE — Telephone Encounter (Signed)
ER letter sent 

## 2015-11-18 ENCOUNTER — Telehealth: Payer: Self-pay | Admitting: Medical

## 2015-11-18 MED ORDER — TRAMADOL HCL 50 MG PO TABS: 50.0000 mg | ORAL_TABLET | Freq: Four times a day (QID) | ORAL | Status: DC | PRN

## 2015-11-18 MED ORDER — OLOPATADINE HCL 0.1 % OP SOLN: 1.0000 [drp] | Freq: Two times a day (BID) | OPHTHALMIC | Status: DC

## 2015-11-18 MED ORDER — DICLOFENAC SODIUM 75 MG PO TBEC: 75.0000 mg | DELAYED_RELEASE_TABLET | Freq: Two times a day (BID) | ORAL | Status: DC

## 2015-11-18 NOTE — Telephone Encounter (Signed)
Sent to [facility] and called pt is aware

## 2015-11-18 NOTE — Telephone Encounter (Signed)
Pt states that she still has not picked up Voltaren & Tramadol med from [facility]. It is very expensive even after her insurance paid so pt wants to know if these meds along with the eye drop script can be sent to [facility] on [address] so she can see if they will all be cheaper there.

## 2015-11-30 ENCOUNTER — Other Ambulatory Visit: Payer: Self-pay | Admitting: Medical

## 2015-11-30 ENCOUNTER — Telehealth: Payer: Self-pay | Admitting: Internal Medicine

## 2015-11-30 MED ORDER — MELOXICAM 15 MG PO TABS: 15.0000 mg | ORAL_TABLET | Freq: Every day | ORAL | Status: DC

## 2015-11-30 NOTE — Telephone Encounter (Signed)
I sent medication, but I would recommend she call and compliant to the orthopedist she saw as to why they weren't willing to prescribe something   F/u with rheumatology

## 2015-11-30 NOTE — Telephone Encounter (Signed)
Left detailed message on pt's vm

## 2015-11-30 NOTE — Telephone Encounter (Signed)
Pt called and states that you sent her to ortho for joint stiffness but ortho referred her to rhemuatology but ortho won't give her anything until she see thems. Her mom has RA and takes meloxicam and she tried on of her pills and said it lasted all day. Pt is wanting to know if you can prescribe meloxicam for her. [facility] to [facility]. You can leave a detailed message.

## 2015-12-17 ENCOUNTER — Telehealth: Payer: Self-pay | Admitting: Medical

## 2015-12-17 NOTE — Telephone Encounter (Signed)
Pt states she still isn't better after all the meds and doctor visits & she still isn't any better.  She thinks maybe her problems are regarding the Mirena and wants to know what you think and if you would remove the IUD?

## 2015-12-22 NOTE — Telephone Encounter (Signed)
Its hard to say, but the IUD wouldn't cause her elbow issues.  If she is referring to fatigue or other symptoms, lets recheck for other options for treatment of fatigue before removing an IUD.

## 2015-12-22 NOTE — Telephone Encounter (Signed)
Pt is aware and is going to follow up with GYN since she is having cramping, body aches, fatigue, and just the "blues" will sign record release so that we get information

## 2015-12-22 NOTE — Telephone Encounter (Signed)
Please advise 

## 2015-12-22 NOTE — Telephone Encounter (Signed)
LMTCB

## 2016-02-01 ENCOUNTER — Ambulatory Visit (INDEPENDENT_AMBULATORY_CARE_PROVIDER_SITE_OTHER): Payer: Medicaid Other | Admitting: Medical

## 2016-02-01 ENCOUNTER — Encounter: Payer: Self-pay | Admitting: Medical

## 2016-02-01 VITALS — BP 130/98 | HR 87 | Wt 285.0 lb

## 2016-02-01 DIAGNOSIS — M2550 Pain in unspecified joint: Secondary | ICD-10-CM | POA: Diagnosis not present

## 2016-02-01 DIAGNOSIS — E118 Type 2 diabetes mellitus with unspecified complications: Secondary | ICD-10-CM | POA: Diagnosis not present

## 2016-02-01 DIAGNOSIS — E669 Obesity, unspecified: Secondary | ICD-10-CM

## 2016-02-01 DIAGNOSIS — R5382 Chronic fatigue, unspecified: Secondary | ICD-10-CM | POA: Diagnosis not present

## 2016-02-01 DIAGNOSIS — L309 Dermatitis, unspecified: Secondary | ICD-10-CM | POA: Diagnosis not present

## 2016-02-01 DIAGNOSIS — R030 Elevated blood-pressure reading, without diagnosis of hypertension: Secondary | ICD-10-CM | POA: Diagnosis not present

## 2016-02-01 DIAGNOSIS — Z975 Presence of (intrauterine) contraceptive device: Secondary | ICD-10-CM

## 2016-02-01 LAB — CBC
HCT: 38 % (ref 35.0–45.0)
HEMOGLOBIN: 12.4 g/dL (ref 11.7–15.5)
MCH: 27.9 pg (ref 27.0–33.0)
MCHC: 32.6 g/dL (ref 32.0–36.0)
MCV: 85.6 fL (ref 80.0–100.0)
MPV: 10.5 fL (ref 7.5–12.5)
PLATELETS: 300 10*3/uL (ref 140–400)
RBC: 4.44 MIL/uL (ref 3.80–5.10)
RDW: 14.3 % (ref 11.0–15.0)
WBC: 6.7 10*3/uL (ref 4.0–10.5)

## 2016-02-01 LAB — COMPREHENSIVE METABOLIC PANEL
ALBUMIN: 4.4 g/dL (ref 3.6–5.1)
ALK PHOS: 50 U/L (ref 33–115)
ALT: 21 U/L (ref 6–29)
AST: 23 U/L (ref 10–30)
BILIRUBIN TOTAL: 0.3 mg/dL (ref 0.2–1.2)
BUN: 6 mg/dL — ABNORMAL LOW (ref 7–25)
CALCIUM: 9.2 mg/dL (ref 8.6–10.2)
CO2: 24 mmol/L (ref 20–31)
Chloride: 105 mmol/L (ref 98–110)
Creat: 0.74 mg/dL (ref 0.50–1.10)
GLUCOSE: 90 mg/dL (ref 65–99)
POTASSIUM: 4.2 mmol/L (ref 3.5–5.3)
Sodium: 138 mmol/L (ref 135–146)
TOTAL PROTEIN: 7.1 g/dL (ref 6.1–8.1)

## 2016-02-01 LAB — LIPID PANEL
Cholesterol: 159 mg/dL (ref 125–200)
HDL: 35 mg/dL — AB (ref >=46)
LDL CALC: 108 mg/dL (ref <130)
Total CHOL/HDL Ratio: 4.5 Ratio (ref <=5.0)
Triglycerides: 80 mg/dL (ref <150)
VLDL: 16 mg/dL (ref <30)

## 2016-02-01 LAB — TSH: TSH: 0.79 m[IU]/L

## 2016-02-01 MED ORDER — TRIAMCINOLONE ACETONIDE 0.5 % EX CREA: 1.0000 "application " | TOPICAL_CREAM | Freq: Three times a day (TID) | CUTANEOUS | 2 refills | Status: DC

## 2016-02-01 NOTE — Progress Notes (Signed)
Subjective: Chief Complaint  Patient presents with  . Wrist Pain    ortho found nothing. was told it was tendonisits. Rheum found nothing. not going down and not going away. thinking it may be from IUD silicon toxic.    Here for a few concerns.  Since last visit here has seen ortho and rheumatology about polyarthralgia, stiffness, swelling of wrist, chronic pain of right wrist.   Was given splint which she is using with some improvement, but no major improvement.  She notes that rheumatology said she did not have RA or rheumatological condition.  She states ortho told her to f/u here.  We do not have notes from ortho or rheum at this time to review  She had nerve conduction studies with ortho and reportedly no abnormality, no CTS.  Had ultrasound of the wrist by rheumatology and there was some swelling in the joint.  Was advised to use splint 6 weeks.    Wants to restart weight loss medication.  Has had success with phentermine prior.   Is exercising, has lost some weight on their own.   She wants advice on getting IUD out.  It is due to come out next year, was inserted IUD 06/2011.  She has read a lot of things online about IUD and silicone toxicity, thinks she needs it removed due to multiple symptoms such as fatigue, joint aches, joint swelling, other.   Past Medical History:  Diagnosis Date  . Diabetes mellitus without complication (HCC)   . Environmental allergies    prior testing 2008;  Dr. [place] CallasSharma  . IUD (intrauterine device) in place 06/2010   mirena  . Obesity    Current Outpatient Prescriptions on File Prior to Visit  Medication Sig Dispense Refill  . fluticasone (FLONASE) 50 MCG/ACT nasal spray Place 2 sprays into both nostrils daily. 16 g 5  . levonorgestrel (MIRENA) 20 MCG/24HR IUD 1 each by Intrauterine route continuous.    . meloxicam (MOBIC) 15 MG tablet Take 1 tablet (15 mg total) by mouth daily. 30 tablet 1  . olopatadine (PATANOL) 0.1 % ophthalmic solution Place 1 drop  into both eyes 2 (two) times daily. 5 mL 5  . traMADol (ULTRAM) 50 MG tablet Take 1 tablet (50 mg total) by mouth every 6 (six) hours as needed. (Patient not taking: Reported on 02/01/2016) 20 tablet 0   No current facility-administered medications on file prior to visit.     ROS as in subjective   Objective: BP (!) 130/98   Pulse 87   Wt 285 lb (129.3 kg)   LMP 01/27/2016   BMI 46.00 kg/m   BP Readings from Last 3 Encounters:  02/01/16 (!) 130/98  11/16/15 (!) 159/102  10/29/15 120/86   Wt Readings from Last 3 Encounters:  02/01/16 285 lb (129.3 kg)  10/29/15 291 lb (132 kg)  08/31/15 289 lb (131.1 kg)   Gen: wd wn, nad Left arm with rough patch of skin nonspecific antecubital region laterally, small round patch right lower leg medially just below knee Heart: RRR, normal s1, s2, no murmurs Lungs clear Ext: no edema Pulses normal Right wrist tender over lateral wrist but no obvious swelling, no other deformity or tenderness, normal ROM of wrist and fingers, rest of bilat arm exam unremarkable   Assessment: Encounter Diagnoses  Name Primary?  . Diabetes mellitus with complication (HCC) Yes  . Polyarthralgia   . Obesity   . IUD (intrauterine device) in place   . Chronic fatigue   .  Eczema   . Elevated blood-pressure reading without diagnosis of hypertension     Plan: Diabetes - labs today as she is past due for labs and f/u on this.   Polyarthralgia, wrist pain - request ortho and rheumatology records, then give her feedback on next steps.   Obesity - pending labs, consider restarting medication to help with weight loss efforts IUD - she is planning to have this removed out of concern for toxicity or side effects.  She will f/u with gynecology Chronic fatigue - labs today Eczema - advised daily moisturizing lotion, refilled medication for flare ups Elevated BP - continue to monitor, work on weight loss changes  Hend[place] NewcomerMichaela was seen today for wrist pain.  Diagnoses  and all orders for this visit:  Diabetes mellitus with complication (HCC) -     Comprehensive metabolic panel -     CBC -     Lipid panel -     TSH -     Hemoglobin A1c  Polyarthralgia -     Comprehensive metabolic panel -     CBC -     Lipid panel -     TSH -     Hemoglobin A1c  Obesity -     Comprehensive metabolic panel -     CBC -     Lipid panel -     TSH -     Hemoglobin A1c  IUD (intrauterine device) in place  Chronic fatigue  Eczema  Elevated blood-pressure reading without diagnosis of hypertension  Other orders -     triamcinolone cream (KENALOG) 0.5 %; Apply 1 application topically 3 (three) times daily.

## 2016-02-02 LAB — HEMOGLOBIN A1C
Hgb A1c MFr Bld: 5.6 % (ref <5.7)
MEAN PLASMA GLUCOSE: 114 mg/dL

## 2016-02-03 ENCOUNTER — Other Ambulatory Visit: Payer: Self-pay | Admitting: Medical

## 2016-02-03 ENCOUNTER — Telehealth: Payer: Self-pay

## 2016-02-03 MED ORDER — NALTREXONE-BUPROPION HCL ER 8-90 MG PO TB12: 2.0000 | ORAL_TABLET | Freq: Two times a day (BID) | ORAL | 1 refills | Status: DC

## 2016-02-03 NOTE — Telephone Encounter (Signed)
Records rcvd [facility] placed in your folder for review. Deborah Little/Deborah Little

## 2016-02-04 ENCOUNTER — Telehealth: Payer: Self-pay | Admitting: Medical

## 2016-02-04 ENCOUNTER — Telehealth: Payer: Self-pay

## 2016-02-04 NOTE — Telephone Encounter (Signed)
Records Rcv[facility] from [facility] placed in your folder for review. Deborah Little/RLB

## 2016-02-04 NOTE — Telephone Encounter (Signed)
Pt called & states Medicaid not covering Contrave.  Called pharmacy & they will fax P.A.

## 2016-02-15 NOTE — Telephone Encounter (Signed)
Called pt & informed, she has to pay out of pocket if she wants this medication.

## 2016-02-15 NOTE — Telephone Encounter (Signed)
Called Medicaid t# [phone] UJ#[id number] L and Medicaid does not cover any weight loss meds and there is not an option to file an appeal.

## 2016-02-24 ENCOUNTER — Ambulatory Visit (INDEPENDENT_AMBULATORY_CARE_PROVIDER_SITE_OTHER): Payer: Medicaid Other | Admitting: Medical

## 2016-02-24 ENCOUNTER — Encounter: Payer: Self-pay | Admitting: Medical

## 2016-02-24 VITALS — BP 130/90 | HR 91 | Temp 98.8°F | Resp 16 | Wt 286.2 lb

## 2016-02-24 DIAGNOSIS — J988 Other specified respiratory disorders: Secondary | ICD-10-CM | POA: Diagnosis not present

## 2016-02-24 MED ORDER — AZITHROMYCIN 250 MG PO TABS
ORAL_TABLET | ORAL | 0 refills | Status: DC
Start: 2016-02-24 — End: 2016-08-13

## 2016-02-24 NOTE — Progress Notes (Signed)
Subjective: Chief Complaint  Patient presents with  . Nasal Congestion    nasal and chest congestion, unable to rest, itchy throat, onset 5 days ago. states "I feel like a train wreck"   Feels like crap.  Thinks she has a cold.  She notes 5 days hx/o symptoms. Having sneezing, coughing, runny nose, body aches, weight on chest, rash on top of upper lip, chest congestion at night, some mild sore throat.  No fever, no NVD.  No ear pain.  Cough is non productive.   Getting some mucous when blowing nose.  Taking mucinex.   Thinks she got this from son who has had a respirator infection and cough.    Past Medical History:  Diagnosis Date  . Diabetes mellitus without complication (HCC)   . Environmental allergies    prior testing 2008;  Dr. [place] CallasSharma  . IUD (intrauterine device) in place 06/2010   mirena  . Obesity    ROS as in subjective   Objective BP 130/90   Pulse 91   Temp 98.8 F (37.1 C) (Oral)   Resp 16   Wt 286 lb 3.2 oz (129.8 kg)   LMP 01/27/2016   SpO2 98%   BMI 46.19 kg/m   General appearance: alert, no distress, WD/WN, mildly ill appearing HEENT: normocephalic, sclerae anicteric, conjunctiva pink and moist, TMs pearly, nares patent, no discharge or erythema, pharynx normal, tonsils unremarkable Oral cavity: MMM, no lesions Neck: supple, no lymphadenopathy, no thyromegaly, no masses Heart: RRR, normal S1, S2, no murmurs Lungs: CTA bilaterally, no wheezes, rhonchi, or rales Pulses: 2+ symmetric     Assessment: Encounter Diagnosis  Name Primary?  Marland Kitchen. Respiratory tract infection Yes    Plan: Currently symptoms suggest viral URI.  Discussed supportive care, but if much worse by weekend, can use Zpak.  Discussed symptoms that would warrant antibiotic.   C/t mucinex DM for now.

## 2016-03-01 ENCOUNTER — Telehealth: Payer: Self-pay

## 2016-03-01 ENCOUNTER — Encounter: Payer: Self-pay | Admitting: Family Medicine

## 2016-03-01 NOTE — Telephone Encounter (Signed)
Zpack but chest still feels tight and she has migrainefor 3 days what would you like for her to do she is very tired

## 2016-03-02 ENCOUNTER — Other Ambulatory Visit: Payer: Self-pay | Admitting: Medical

## 2016-03-02 ENCOUNTER — Other Ambulatory Visit: Payer: Self-pay | Admitting: Orthopaedic Surgery

## 2016-03-02 MED ORDER — PREDNISONE 20 MG PO TABS: ORAL_TABLET | ORAL | 0 refills | Status: DC

## 2016-03-02 NOTE — Telephone Encounter (Signed)
Pt informed and verbalized understanding

## 2016-03-02 NOTE — Telephone Encounter (Signed)
Lets have her do a short burst of prednisone which may help both respiratory symptoms and migraine.   Oral prednisone 2 tablets daily for 3 days sent to pharmacy

## 2016-03-03 ENCOUNTER — Other Ambulatory Visit: Payer: Self-pay | Admitting: Orthopaedic Surgery

## 2016-03-03 DIAGNOSIS — M25531 Pain in right wrist: Secondary | ICD-10-CM

## 2016-03-03 DIAGNOSIS — M25831 Other specified joint disorders, right wrist: Secondary | ICD-10-CM

## 2016-03-12 ENCOUNTER — Ambulatory Visit
Admission: RE | Admit: 2016-03-12 | Discharge: 2016-03-12 | Disposition: A | Discharge status: Home / Self Care | Payer: Medicaid Other | Source: Ambulatory Visit | Attending: Orthopaedic Surgery | Admitting: Orthopaedic Surgery

## 2016-03-12 DIAGNOSIS — M25831 Other specified joint disorders, right wrist: Secondary | ICD-10-CM

## 2016-03-12 DIAGNOSIS — M25531 Pain in right wrist: Secondary | ICD-10-CM

## 2016-03-12 MED ORDER — GADOBENATE DIMEGLUMINE 529 MG/ML IV SOLN
20.0000 mL | Freq: Once | INTRAVENOUS | Status: AC | PRN
  Administered 2016-03-12: 20 mL via INTRAVENOUS

## 2016-03-13 ENCOUNTER — Ambulatory Visit: Payer: Medicaid Other | Admitting: Family Medicine

## 2016-03-29 ENCOUNTER — Encounter: Payer: Self-pay | Admitting: Family Medicine

## 2016-05-11 ENCOUNTER — Encounter (HOSPITAL_COMMUNITY): Payer: Self-pay | Admitting: Pharmacy Technician

## 2016-05-11 DIAGNOSIS — Z5181 Encounter for therapeutic drug level monitoring: Secondary | ICD-10-CM | POA: Insufficient documentation

## 2016-05-11 DIAGNOSIS — E119 Type 2 diabetes mellitus without complications: Secondary | ICD-10-CM | POA: Insufficient documentation

## 2016-05-11 DIAGNOSIS — F1290 Cannabis use, unspecified, uncomplicated: Secondary | ICD-10-CM | POA: Diagnosis present

## 2016-05-11 NOTE — ED Triage Notes (Signed)
Per EMS:  Pt has a hx of smoking marijuana, but today decided to eat a "handful" of edibles.  Pt now experiencing anxiety, paranoia, and shortness of breath.  Pt's SpO2 on RA 100%.  Pt tachy during transport (120 bpm).

## 2016-05-12 ENCOUNTER — Emergency Department (HOSPITAL_COMMUNITY)
Admission: EM | Admit: 2016-05-12 | Discharge: 2016-05-12 | Disposition: A | Discharge status: Home / Self Care | Payer: Medicaid Other | Attending: Emergency Medicine | Admitting: Emergency Medicine

## 2016-05-12 DIAGNOSIS — F1290 Cannabis use, unspecified, uncomplicated: Secondary | ICD-10-CM

## 2016-05-12 LAB — RAPID URINE DRUG SCREEN, HOSP PERFORMED
AMPHETAMINES: NOT DETECTED
BARBITURATES: NOT DETECTED
Benzodiazepines: NOT DETECTED
Cocaine: NOT DETECTED
Opiates: NOT DETECTED
TETRAHYDROCANNABINOL: POSITIVE — AB

## 2016-05-12 MED ORDER — SODIUM CHLORIDE 0.9 % IV BOLUS (SEPSIS)
1000.0000 mL | Freq: Once | INTRAVENOUS | Status: AC
  Administered 2016-05-12: 1000 mL via INTRAVENOUS

## 2016-05-12 NOTE — ED Notes (Signed)
Pt stable, understands discharge instructions, and reasons for return.   

## 2016-05-12 NOTE — ED Provider Notes (Signed)
[facility]ENCY DEPT Provider Note   CSN: [id number] Arrival date & time: 05/11/16  2153  History   Chief Complaint Chief Complaint  Patient presents with  . Drug Overdose   HPI Deborah Little is a 30 y.o. female.  HPI   Patient To the ER with complaints of adverse reaction to edible marijuana. She has only smoked marijuana once or twice before and only a little bit. Today for the first time she ate 5 edible marijuana gummy bears. Patient reports not feeling anything for approximately 30 minutes at which point she started to feel paranoid, her body with twitching, some confusion, some blurry vision, short of breath. On arrival she is tachycardic and anxious all of this resolved approximately at 11 PM. She believes that she dB between 6 and 7 PM. Currently is not having any symptoms but is concerned that the gummy bears may have had more than marijuana in them and would like a drug test.  Past Medical History:  Diagnosis Date  . Diabetes mellitus without complication (HCC)   . Environmental allergies    prior testing 2008;  Dr. [place] CallasSharma  . IUD (intrauterine device) in place 06/2010   mirena  . Obesity     There are no active problems to display for this patient.   Past Surgical History:  Procedure Laterality Date  . NO PAST SURGERIES    . WISDOM TOOTH EXTRACTION      OB History    Gravida Para Term Preterm AB Living   1 1 1  0 0 1   SAB TAB Ectopic Multiple Live Births   0 0 0 0 1       Home Medications    Prior to Admission medications   Medication Sig Start Date End Date Taking? Authorizing Provider  azithromycin (ZITHROMAX) 250 MG tablet 2 tablets day 1, then 1 tablet days 2-4 02/24/16   Kermit Baloavid S Tysinger, PA-C  cetirizine (ZYRTEC) 10 MG tablet Take 10 mg by mouth daily.    Historical Provider, MD  fluticasone (FLONASE) 50 MCG/ACT nasal spray Place 2 sprays into both nostrils daily. 10/29/15   Jac Canavanavid S Tysinger, PA-C  levonorgestrel (MIRENA) 20 MCG/24HR IUD 1 each by  Intrauterine route continuous.    Historical Provider, MD  meloxicam (MOBIC) 15 MG tablet Take 1 tablet (15 mg total) by mouth daily. 11/30/15   Kermit Baloavid S Tysinger, PA-C  Naltrexone-Bupropion HCl ER 8-90 MG TB12 Take 2 tablets by mouth 2 (two) times daily with a meal. Patient not taking: Reported on 02/24/2016 02/03/16   Kermit Baloavid S Tysinger, PA-C  olopatadine (PATANOL) 0.1 % ophthalmic solution Place 1 drop into both eyes 2 (two) times daily. 11/18/15   Kermit Baloavid S Tysinger, PA-C  predniSONE (DELTASONE) 20 MG tablet 2 tablets daily for 3 days 03/02/16   Kermit Baloavid S Tysinger, PA-C  traMADol (ULTRAM) 50 MG tablet Take 1 tablet (50 mg total) by mouth every 6 (six) hours as needed. Patient not taking: Reported on 02/01/2016 11/18/15   Kermit Baloavid S Tysinger, PA-C  triamcinolone cream (KENALOG) 0.5 % Apply 1 application topically 3 (three) times daily. 02/01/16   Jac Canavanavid S Tysinger, PA-C    Family History Family History  Problem Relation Age of Onset  . Swallowing difficulties Brother   . Hypertension Father   . Stroke Maternal Grandmother   . Diabetes Neg Hx   . Heart disease Neg Hx     Social History Social History  Substance Use Topics  . Smoking status: Never Smoker  .  Smokeless tobacco: Never Used  . Alcohol use Yes     Comment: socially     Allergies   Fish allergy   Review of Systems Review of Systems  Review of Systems All other systems negative except as documented in the HPI. All pertinent positives and negatives as reviewed in the HPI.  Physical Exam Updated Vital Signs BP 144/90   Pulse 113   Temp 98 F (36.7 C) (Oral)   Resp 18   SpO2 91%   Physical Exam  Constitutional: She is oriented to person, place, and time. She appears well-developed and well-nourished. No distress.  HENT:  Head: Normocephalic and atraumatic.  Right Ear: Tympanic membrane and ear canal normal.  Left Ear: Tympanic membrane and ear canal normal.  Nose: Nose normal.  Mouth/Throat: Uvula is midline and oropharynx  is clear and moist.  Eyes: Conjunctivae, EOM and lids are normal. Pupils are equal, round, and reactive to light.  Neck: Normal range of motion. Neck supple. No spinous process tenderness and no muscular tenderness present. No neck rigidity. Normal range of motion present. No Brudzinski's sign and no Kernig's sign noted.  Cardiovascular: Normal rate and regular rhythm.   Pulmonary/Chest: Effort normal.  Abdominal: Soft.  Neurological: She is alert and oriented to person, place, and time.  Cranial nerves grossly intact on exam. Pt alert and oriented x 3 Upper and lower extremity strength is symmetrical and physiologic Normal muscular tone No facial droop Coordination intact, no limb ataxia,No pronator drift  Skin: Skin is warm and dry.  Nursing note and vitals reviewed.   ED Treatments / Results  Labs (all labs ordered are listed, but only abnormal results are displayed) Labs Reviewed  RAPID URINE DRUG SCREEN, HOSP PERFORMED - Abnormal; Notable for the following:       Result Value   Tetrahydrocannabinol POSITIVE (*)    All other components within normal limits    EKG  EKG Interpretation None       Radiology No results found.  Procedures Procedures (including critical care time)  Medications Ordered in ED Medications  sodium chloride 0.9 % bolus 1,000 mL (1,000 mLs Intravenous New Bag/Given 05/12/16 0210)    Initial Impression / Assessment and Plan / ED Course  I have reviewed the triage vital signs and the nursing notes.  Pertinent labs & imaging results that were available during my care of the patient were reviewed by me and considered in my medical decision making (see chart for details).  Clinical Course    Patients symptoms have all resolved prior to my exam. UDS shows marijuana. I advised no longer using illicit drugs. Patient education given and s/sx that warrant return to ED. Pt feels comfortable with this plan.  Pulse improved to 89 in room with patient.  Well appearing, family is here to take her home.   Final Clinical Impressions(s) / ED Diagnoses   Final diagnoses:  Marijuana use    New Prescriptions New Prescriptions   No medications on file     Marlon Peliffany Kerwin Augustus, Cordelia Poche-C 05/12/16 0304    Glynn OctaveStephen Rancour, MD 05/12/16 [phone]

## 2016-05-12 NOTE — ED Notes (Signed)
Computer not working in room. Pt verbalized understanding of discharge instructions.

## 2016-06-08 ENCOUNTER — Ambulatory Visit (INDEPENDENT_AMBULATORY_CARE_PROVIDER_SITE_OTHER): Payer: Medicaid Other | Admitting: Orthopedic Surgery

## 2016-06-08 ENCOUNTER — Encounter (INDEPENDENT_AMBULATORY_CARE_PROVIDER_SITE_OTHER): Payer: Self-pay | Admitting: Orthopedic Surgery

## 2016-06-08 ENCOUNTER — Telehealth (INDEPENDENT_AMBULATORY_CARE_PROVIDER_SITE_OTHER): Payer: Self-pay | Admitting: Orthopaedic Surgery

## 2016-06-08 DIAGNOSIS — M19041 Primary osteoarthritis, right hand: Secondary | ICD-10-CM

## 2016-06-08 NOTE — Telephone Encounter (Signed)
Patient called to let Dr Roda ShuttersXu know that her Rt  hand and both feet are swollen really bad and very painful. Patient said she is taking Celebrex, Meloxicam and prednisone and the medication is not working. Patient want to know what was she diag. with. Patient want Dr Roda ShuttersXu to see what the swelling look like now. The number to contact her is  [phone]

## 2016-06-08 NOTE — Telephone Encounter (Signed)
yes

## 2016-06-08 NOTE — Telephone Encounter (Signed)
Would you like for pt to come in tomorrow.

## 2016-06-08 NOTE — Progress Notes (Signed)
Office Visit Note   Patient: Deborah Little           Date of Birth: 09/11/1985           MRN: [id number] Visit Date: 06/08/2016              Requested by: Ronnald NianJohn C Lalonde, MD [address] [place], [place] [id number] PCP: Carollee HerterLALONDE,JOHN CHARLES, MD   Assessment & Plan: Visit Diagnoses:  1. Primary osteoarthritis, right hand     Plan: Continue with her prednisone Dosepak recommended taken in the morning she's taken the evening and keeping him up at night. Recommend paraffin heat Bass for her hand to help with the arthritis of the thumb do not feel there is any indication for surgical intervention at this time would hold on any further injections into her thumb.  Follow-Up Instructions: Return if symptoms worsen or fail to improve.   Orders:  No orders of the defined types were placed in this encounter.  No orders of the defined types were placed in this encounter.     Procedures: No procedures performed   Clinical Data: No additional findings.   Subjective: Chief Complaint  Patient presents with  . Right Wrist - Pain  . Right Foot - Pain  . Left Foot - Pain    Patient comes in today with right wrist pain and Bil feet pain. Saw Dr Roda ShuttersXu before.  She is having stiffness. Taking Celebrex, Meloxicam, Prednisone and does not help much.  Swelling in right hand near thumb. Cannot do certain things. Right handed. Had injections with her PCP helped some.   Tingling in bil feet(toes). Hurts to Wb. The cold weather makes it worse.    Review of Systems  Patient states that she has had nerve conduction studies that were normal she is seen a rheumatologist in a rheumatology workup was negative. Patient complains of pain and swelling at the base of the right thumb. She states she was on Mobic she is currently on prednisone. Objective: Vital Signs: There were no vitals taken for this visit.  Physical Exam examination patient is alert oriented no adenopathy well-dressed normal  affect normal respiratory effort BMI greater than 40. Examination her hands has full range of motion it is neurovascular intact with good pulses. The scaphoid scapholunate and TFCC and nontender to palpation. The Phalen's and Tinel's tests are negative. She has point tenderness to palpation at the base of the carpometacarpal joint of the right thumb. Finkelstein's test is negative.  Ortho Exam  Specialty Comments:  No specialty comments available.  Imaging: No results found.   PMFS History: Patient Active Problem List   Diagnosis Date Noted  . Primary osteoarthritis, right hand 06/08/2016   Past Medical History:  Diagnosis Date  . Diabetes mellitus without complication (HCC)   . Environmental allergies    prior testing 2008;  Dr. [place] CallasSharma  . IUD (intrauterine device) in place 06/2010   mirena  . Obesity     Family History  Problem Relation Age of Onset  . Swallowing difficulties Brother   . Hypertension Father   . Stroke Maternal Grandmother   . Diabetes Neg Hx   . Heart disease Neg Hx     Past Surgical History:  Procedure Laterality Date  . NO PAST SURGERIES    . WISDOM TOOTH EXTRACTION     Social History   Occupational History  . Not on file.   Social History Main Topics  . Smoking status: Never Smoker  .  Smokeless tobacco: Never Used  . Alcohol use Yes     Comment: socially  . Drug use: No  . Sexual activity: Yes

## 2016-06-08 NOTE — Telephone Encounter (Signed)
Saw dr duda today

## 2016-06-09 ENCOUNTER — Telehealth (INDEPENDENT_AMBULATORY_CARE_PROVIDER_SITE_OTHER): Payer: Self-pay | Admitting: Orthopaedic Surgery

## 2016-06-09 ENCOUNTER — Ambulatory Visit (INDEPENDENT_AMBULATORY_CARE_PROVIDER_SITE_OTHER): Payer: Medicaid Other | Admitting: Orthopaedic Surgery

## 2016-06-09 NOTE — Telephone Encounter (Signed)
Printed out her dictation and its ready for pick up at the front desk.

## 2016-06-15 ENCOUNTER — Telehealth (INDEPENDENT_AMBULATORY_CARE_PROVIDER_SITE_OTHER): Payer: Self-pay | Admitting: Orthopaedic Surgery

## 2016-06-15 NOTE — Telephone Encounter (Signed)
Pt called again stating she isnt able to do her job because of the medication she is on, she is wanting some type of note stating why she cannot do normal activities because of her diagnosis and swollen joints and her hands swelling.

## 2016-06-16 ENCOUNTER — Other Ambulatory Visit: Payer: Self-pay | Admitting: Medical

## 2016-06-16 ENCOUNTER — Encounter (INDEPENDENT_AMBULATORY_CARE_PROVIDER_SITE_OTHER): Payer: Self-pay

## 2016-06-16 ENCOUNTER — Other Ambulatory Visit (INDEPENDENT_AMBULATORY_CARE_PROVIDER_SITE_OTHER): Payer: Self-pay

## 2016-06-16 MED ORDER — DICLOFENAC SODIUM 75 MG PO TBEC: 75.0000 mg | DELAYED_RELEASE_TABLET | Freq: Two times a day (BID) | ORAL | 3 refills | Status: DC

## 2016-06-16 MED ORDER — DICLOFENAC SODIUM 75 MG PO TBEC: 75.0000 mg | DELAYED_RELEASE_TABLET | Freq: Two times a day (BID) | ORAL | 6 refills | Status: DC

## 2016-06-16 NOTE — Telephone Encounter (Signed)
Diclofenac 75 mg bid. #30 6 refills.  Ok on work note

## 2016-06-16 NOTE — Telephone Encounter (Signed)
Called and lm on vm to advise that she can call  with the fax number for her work note and we can send that for her and rx for diclofenac has been faxed to her pharm.

## 2016-06-16 NOTE — Telephone Encounter (Signed)
Please advise 

## 2016-06-16 NOTE — Telephone Encounter (Signed)
Called in.

## 2016-06-16 NOTE — Telephone Encounter (Signed)
Pt keeps calling for work note, stating she needs this before the weekend. Pt requesting call back [phone]. Pt is also requesting a different medication.

## 2016-06-19 HISTORY — PX: GALLBLADDER SURGERY: SHX652

## 2016-06-23 ENCOUNTER — Ambulatory Visit (INDEPENDENT_AMBULATORY_CARE_PROVIDER_SITE_OTHER): Payer: Medicaid Other | Admitting: Orthopaedic Surgery

## 2016-07-03 ENCOUNTER — Telehealth (INDEPENDENT_AMBULATORY_CARE_PROVIDER_SITE_OTHER): Payer: Self-pay | Admitting: Orthopaedic Surgery

## 2016-07-03 NOTE — Telephone Encounter (Signed)
Can you please advise since Dr Roda Shuttersxu is out of the office and returns back on Thursday. Thanks

## 2016-07-03 NOTE — Telephone Encounter (Signed)
OK TO RETURN TO WORK

## 2016-07-03 NOTE — Telephone Encounter (Signed)
Patient called asked if a letter can be written stating she can return to work today. The manager is asking for the letter. Patient said she had to leave the job today and can not return until she bring in a letter to return to work. Patient is asking if she can pick up letter today so she can return back to work today. The number to contact the patient is [phone]

## 2016-07-04 NOTE — Telephone Encounter (Signed)
Note made.  

## 2016-08-13 ENCOUNTER — Ambulatory Visit (HOSPITAL_COMMUNITY)
Admission: EM | Admit: 2016-08-13 | Discharge: 2016-08-13 | Disposition: A | Discharge status: Home / Self Care | Payer: Medicaid Other | Attending: Family Medicine | Admitting: Family Medicine

## 2016-08-13 ENCOUNTER — Encounter (HOSPITAL_COMMUNITY): Payer: Self-pay | Admitting: Emergency Medicine

## 2016-08-13 DIAGNOSIS — L03312 Cellulitis of back [any part except buttock]: Secondary | ICD-10-CM | POA: Diagnosis not present

## 2016-08-13 DIAGNOSIS — W57XXXA Bitten or stung by nonvenomous insect and other nonvenomous arthropods, initial encounter: Secondary | ICD-10-CM | POA: Diagnosis not present

## 2016-08-13 MED ORDER — DOXYCYCLINE HYCLATE 100 MG PO CAPS: 100.0000 mg | ORAL_CAPSULE | Freq: Two times a day (BID) | ORAL | 0 refills | Status: DC

## 2016-08-13 MED ORDER — IBUPROFEN 800 MG PO TABS: 800.0000 mg | ORAL_TABLET | Freq: Three times a day (TID) | ORAL | 0 refills | Status: DC | PRN

## 2016-08-13 NOTE — Discharge Instructions (Signed)
You have a local infection presumed from an insect bite. The antibiotic will help, but if you start running high fevers, or the redness worsens or other symptoms then please f/u here or in the ED. Otherwise feel better. Use the Ibuprofen as needed for pain.

## 2016-08-13 NOTE — ED Triage Notes (Signed)
[facility] a [facility] of a possible insect bite on her right flank that occurred 3 days ago.

## 2016-08-13 NOTE — ED Provider Notes (Signed)
CSN: [id number]     Arrival date & time 08/13/16  1926 History   First MD Initiated Contact with Patient 08/13/16 1936     Chief Complaint  Patient presents with  . Insect Bite   (Consider location/radiation/quality/duration/timing/severity/associated sxs/prior Treatment) 31 yo presents with redness and tenderness in the right flank area following a possible insect bite 3 days ago. She states the area has becmoe warm and she is having more pain. No fevers or chills, No N, V.       Past Medical History:  Diagnosis Date  . Diabetes mellitus without complication (HCC)   . Environmental allergies    prior testing 2008;  Dr. [address] CallasSharma  . IUD (intrauterine device) in place 06/2010   mirena  . Obesity    Past Surgical History:  Procedure Laterality Date  . NO PAST SURGERIES    . WISDOM TOOTH EXTRACTION     Family History  Problem Relation Age of Onset  . Swallowing difficulties Brother   . Hypertension Father   . Stroke Maternal Grandmother   . Diabetes Neg Hx   . Heart disease Neg Hx    Social History  Substance Use Topics  . Smoking status: Never Smoker  . Smokeless tobacco: Never Used  . Alcohol use Yes     Comment: socially   OB History    Gravida Para Term Preterm AB Living   1 1 1  0 0 1   SAB TAB Ectopic Multiple Live Births   0 0 0 0 1     Review of Systems  All other systems reviewed and are negative.   Allergies  Fish allergy  Home Medications   Prior to Admission medications   Medication Sig Start Date End Date Taking? Authorizing Provider  levonorgestrel (MIRENA) 20 MCG/24HR IUD 1 each by Intrauterine route continuous.   Yes Historical Provider, MD  doxycycline (VIBRAMYCIN) 100 MG capsule Take 1 capsule (100 mg total) by mouth 2 (two) times daily. 08/13/16   Riki SheerMichelle G Kaylan Yates, PA-C  ibuprofen (ADVIL,MOTRIN) 800 MG tablet Take 1 tablet (800 mg total) by mouth every 8 (eight) hours as needed. 08/13/16   Riki SheerMichelle G Makela Niehoff, PA-C   Meds Ordered and Administered  this Visit  Medications - No data to display  BP 135/72 (BP Location: Right Arm)   Pulse 113   Temp 98.4 F (36.9 C) (Oral)   Resp 18   SpO2 100%  No data found.   Physical Exam  Constitutional: She is oriented to person, place, and time. She appears well-developed and well-nourished.  Neurological: She is alert and oriented to person, place, and time.  Skin: Skin is warm and dry. Rash noted. There is erythema.  4x4 cm erythematous area to right flank, warm to touch with small pin point center, no necrosis is noted. No additional rashes  Psychiatric: Her behavior is normal.  Nursing note and vitals reviewed.   Urgent Care Course     Procedures (including critical care time)  Labs Review Labs Reviewed - No data to display  Imaging Review No results found.   Visual Acuity Review  Right Eye Distance:   Left Eye Distance:   Bilateral Distance:    Right Eye Near:   Left Eye Near:    Bilateral Near:         MDM   1. Cellulitis of back except buttock   2. Insect bite, initial encounter   Probable insect bite, now cellulitis to right flank region. Treat with Doxycycline  for 10 days, cool compresses and Ibuprofen for pain. Any emergent symptoms then please f/u here or in the ED.     Riki Sheer, PA-C 08/13/16 [phone]

## 2016-08-23 ENCOUNTER — Telehealth (INDEPENDENT_AMBULATORY_CARE_PROVIDER_SITE_OTHER): Payer: Self-pay | Admitting: Orthopaedic Surgery

## 2016-08-23 NOTE — Telephone Encounter (Signed)
Please advise 

## 2016-08-23 NOTE — Telephone Encounter (Signed)
Pt called again checking status of this

## 2016-08-23 NOTE — Telephone Encounter (Signed)
Patient said she has been taking meloxicam and celebrex, doubling the medication at times and still has no pain relief. She is requesting something stronger to relieve the pain in her shoulders, buttock, and toes so she is able to manage without missing work.  Cb#: [phone] [facility] [address]

## 2016-08-23 NOTE — Telephone Encounter (Signed)
Pending, waiting on Dr Warren DanesXu's response

## 2016-08-23 NOTE — Telephone Encounter (Signed)
Tramadol 50 mg TID prn #30.

## 2016-08-24 MED ORDER — TRAMADOL HCL 50 MG PO TABS: ORAL_TABLET | ORAL | 0 refills | Status: DC

## 2016-08-24 NOTE — Telephone Encounter (Signed)
Called Rx into her pharm. Patient aware

## 2016-08-24 NOTE — Addendum Note (Signed)
Addended by: Albertina ParrGARCIA, Shyenne Maggard on: 08/24/2016 04:17 PM   Modules accepted: Orders

## 2016-09-21 ENCOUNTER — Ambulatory Visit (INDEPENDENT_AMBULATORY_CARE_PROVIDER_SITE_OTHER): Payer: Medicaid Other | Admitting: Family Medicine

## 2016-09-21 ENCOUNTER — Encounter: Payer: Self-pay | Admitting: Family Medicine

## 2016-09-21 VITALS — BP 110/78 | HR 75 | Temp 98.3°F | Wt 291.0 lb

## 2016-09-21 DIAGNOSIS — L309 Dermatitis, unspecified: Secondary | ICD-10-CM | POA: Diagnosis not present

## 2016-09-21 DIAGNOSIS — Z975 Presence of (intrauterine) contraceptive device: Secondary | ICD-10-CM

## 2016-09-21 MED ORDER — TRIAMCINOLONE ACETONIDE 0.1 % EX CREA: 1.0000 "application " | TOPICAL_CREAM | Freq: Two times a day (BID) | CUTANEOUS | 1 refills | Status: DC

## 2016-09-21 NOTE — Patient Instructions (Signed)
The OB/GYN office will call you to schedule an appointment.    Eczema Eczema, also called atopic dermatitis, is a skin disorder that causes inflammation of the skin. It causes a red rash and dry, scaly skin. The skin becomes very itchy. Eczema is generally worse during the cooler winter months and often improves with the warmth of summer. Eczema usually starts showing signs in infancy. Some children outgrow eczema, but it may last through adulthood. What are the causes? The exact cause of eczema is not known, but it appears to run in families. People with eczema often have a family history of eczema, allergies, asthma, or hay fever. Eczema is not contagious. Flare-ups of the condition may be caused by:  Contact with something you are sensitive or allergic to.  Stress. What are the signs or symptoms?  Dry, scaly skin.  Red, itchy rash.  Itchiness. This may occur before the skin rash and may be very intense. How is this diagnosed? The diagnosis of eczema is usually made based on symptoms and medical history. How is this treated? Eczema cannot be cured, but symptoms usually can be controlled with treatment and other strategies. A treatment plan might include:  Controlling the itching and scratching.  Use over-the-counter antihistamines as directed for itching. This is especially useful at night when the itching tends to be worse.  Use over-the-counter steroid creams as directed for itching.  Avoid scratching. Scratching makes the rash and itching worse. It may also result in a skin infection (impetigo) due to a break in the skin caused by scratching.  Keeping the skin well moisturized with creams every day. This will seal in moisture and help prevent dryness. Lotions that contain alcohol and water should be avoided because they can dry the skin.  Limiting exposure to things that you are sensitive or allergic to (allergens).  Recognizing situations that cause stress.  Developing a  plan to manage stress. Follow these instructions at home:  Only take over-the-counter or prescription medicines as directed by your health care provider.  Do not use anything on the skin without checking with your health care provider.  Keep baths or showers short (5 minutes) in warm (not hot) water. Use mild cleansers for bathing. These should be unscented. You may add nonperfumed bath oil to the bath water. It is best to avoid soap and bubble bath.  Immediately after a bath or shower, when the skin is still damp, apply a moisturizing ointment to the entire body. This ointment should be a petroleum ointment. This will seal in moisture and help prevent dryness. The thicker the ointment, the better. These should be unscented.  Keep fingernails cut short. Children with eczema may need to wear soft gloves or mittens at night after applying an ointment.  Dress in clothes made of cotton or cotton blends. Dress lightly, because heat increases itching.  A child with eczema should stay away from anyone with fever blisters or cold sores. The virus that causes fever blisters (herpes simplex) can cause a serious skin infection in children with eczema. Contact a health care provider if:  Your itching interferes with sleep.  Your rash gets worse or is not better within 1 week after starting treatment.  You see pus or soft yellow scabs in the rash area.  You have a fever.  You have a rash flare-up after contact with someone who has fever blisters. This information is not intended to replace advice given to you by your health care provider. Make sure  you discuss any questions you have with your health care provider. Document Released: 06/02/2000 Document Revised: 11/11/2015 Document Reviewed: 01/06/2013 Elsevier Interactive Patient Education  2017 [employer].

## 2016-09-21 NOTE — Progress Notes (Signed)
   Subjective:    Patient ID: Deborah Little, female    DOB: 09/02/1985, 31 y.o.   MRN: [id number]  HPI Chief Complaint  Patient presents with  . eczema    eczema- wants referral for mirena   She is here with complaints an eczema flare up on her left arm. Reports history of eczema since childhood with occasional flares. Reports left arm is dry and itching and she ran out of triamcinolone cream for it. Requests a refill.   She would also like a referral to GYN to have her mirena IUD removed. States it has been in for 5 years.   Denies fever, chills, nausea, vomiting, diarrhea.   Reviewed allergies, medications, past medical, surgical, family, and social history.   Review of Systems Pertinent positives and negatives in the history of present illness.     Objective:   Physical Exam BP 110/78   Pulse 75   Temp 98.3 F (36.8 C) (Oral)   Wt 291 lb (132 kg)   BMI 46.97 kg/m   Left antecubital area with patch of dry skin and some scaling, no erythema, edema or drainage, no sign of infection.       Assessment & Plan:  Eczema, unspecified type - Plan: triamcinolone cream (KENALOG) 0.1 %  IUD (intrauterine device) in place  Discussed management and treatment of eczema. Triamcinolone sent to pharmacy. No sign on secondary infection. She is knowledgeable about this condition.  Referral made to OB/GYN for mirena removal and possible new one.  Follow up as needed.

## 2016-10-17 ENCOUNTER — Ambulatory Visit (INDEPENDENT_AMBULATORY_CARE_PROVIDER_SITE_OTHER): Payer: Medicaid Other | Admitting: Obstetrics & Gynecology

## 2016-10-17 ENCOUNTER — Encounter: Payer: Self-pay | Admitting: Obstetrics & Gynecology

## 2016-10-17 ENCOUNTER — Other Ambulatory Visit (HOSPITAL_COMMUNITY)
Admission: RE | Admit: 2016-10-17 | Discharge: 2016-10-17 | Disposition: A | Discharge status: Home / Self Care | Payer: Medicaid Other | Source: Ambulatory Visit | Attending: Obstetrics & Gynecology | Admitting: Obstetrics & Gynecology

## 2016-10-17 VITALS — BP 116/79 | HR 81 | Ht 67.0 in | Wt 282.2 lb

## 2016-10-17 DIAGNOSIS — Z0000 Encounter for general adult medical examination without abnormal findings: Secondary | ICD-10-CM

## 2016-10-17 DIAGNOSIS — Z113 Encounter for screening for infections with a predominantly sexual mode of transmission: Secondary | ICD-10-CM

## 2016-10-17 DIAGNOSIS — Z30432 Encounter for removal of intrauterine contraceptive device: Secondary | ICD-10-CM

## 2016-10-17 DIAGNOSIS — Z30430 Encounter for insertion of intrauterine contraceptive device: Secondary | ICD-10-CM | POA: Diagnosis not present

## 2016-10-17 DIAGNOSIS — Z01419 Encounter for gynecological examination (general) (routine) without abnormal findings: Secondary | ICD-10-CM

## 2016-10-17 DIAGNOSIS — Z30433 Encounter for removal and reinsertion of intrauterine contraceptive device: Secondary | ICD-10-CM | POA: Diagnosis not present

## 2016-10-17 MED ORDER — LEVONORGESTREL 18.6 MCG/DAY IU IUD
INTRAUTERINE_SYSTEM | Freq: Once | INTRAUTERINE | Status: AC
  Administered 2016-10-17: 15:00:00 via INTRAUTERINE

## 2016-10-17 NOTE — Patient Instructions (Signed)

## 2016-10-17 NOTE — Progress Notes (Signed)
Subjective:     Deborah Little is a 31 y.o. female here for a routine exam.  G1P1 SVDx 1 term. Current complaints: Pt has had current LnIUD for 5 years and 4 months. Pt denies problems. She has not had a PAP for 5 years.  Pt with same partner for  4 1/2 years although she says that she has 'stepped out' and she does not trust her partner.    Gynecologic History Patient's last menstrual period was 10/09/2016 (exact date). Contraception: IUD Last Pap:  5 years prev. Results were: normal Last mammogram: n/a.   Obstetric History OB History  Gravida Para Term Preterm AB Living  0 0 1  SAB TAB Ectopic Multiple Live Births  0 0 0 0 1    # Outcome Date GA Lbr Len/2nd Weight Sex Delivery Anes PTL Lv  1 Term 05/18/11 [redacted]w[redacted]d 14:17 / 00:47 7 lb 7.6 oz (3.39 kg) M Vag-Spont EPI, Local  LIV     Birth Comments: No prolonged stay; no NICU     The following portions of the patient's history were reviewed and updated as appropriate: allergies, current medications, past family history, past medical history, past social history, past surgical history and problem list.  Review of Systems Pertinent items are noted in HPI.    Objective:  BP 116/79   Pulse 81   Ht  (1.702 m)   Wt 282 lb 3.2 oz (128 kg)   LMP 10/09/2016 (Exact Date)   BMI 44.20 kg/m   General Appearance:    Alert, cooperative, no distress, appears stated age  Head:    Normocephalic, without obvious abnormality, atraumatic  Eyes:    conjunctiva/corneas clear, EOM's intact, both eyes  Ears:    Normal external ear canals, both ears  Nose:   Nares normal, septum midline, mucosa normal, no drainage    or sinus tenderness  Throat:   Lips, mucosa, and tongue normal; teeth and gums normal  Neck:   Supple, symmetrical, trachea midline, no adenopathy;    thyroid:  no enlargement/tenderness/nodules  Back:     Symmetric, no curvature, ROM normal, no CVA tenderness  Lungs:     Clear to auscultation bilaterally, respirations  unlabored  Chest Wall:    No tenderness or deformity   Heart:    Regular rate and rhythm, S1 and S2 normal, no murmur, rub   or gallop  Breast Exam:    No tenderness, masses, or nipple abnormality  Abdomen:     Soft, non-tender, bowel sounds active all four quadrants,    no masses, no organomegaly  Genitalia:    Normal female without lesion, discharge or tenderness     Extremities:   Extremities normal, atraumatic, no cyanosis or edema  Pulses:   2+ and symmetric all extremities  Skin:   Skin color, texture, turgor normal, no rashes or lesions   Patient was in the dorsal lithotomy position, normal external genitalia was noted.  A speculum was placed in the patient's vagina, normal discharge was noted, no lesions. The multiparous cervix was visualized, no lesions, no abnormal discharge;  and the cervix was swabbed with Betadine using scopettes. The strings of the IUD were grasped and pulled using ring forceps.  The IUD was successfully removed in its entirety.  There cervix was then cleaned with Betadine x 2.  Grasped anteriorly with a single tooth tenaculum.  Uterus sounded to 8 cm after being dilated. Liletta IUD placed per manufacturer's recommendations.  Strings  trimmed to 3 cm. Tenaculum was removed, good hemostasis noted.  Patient tolerated procedure well.    Assessment:    Healthy female exam.   PAP with STI screen  STI lab screen   Plan:  Patient was given post-procedure instructions.  Patient was asked to follow up in 4 weeks for IUD check. LAB: HIV, RPR, Hep C and, Hep B f/u PAP with reflex for HPV f/u 1 year annual  Roselind Klus L. Harraway-Smith, M.D., [facility]

## 2016-10-18 LAB — HEPATITIS B SURFACE ANTIGEN: HEP B S AG: NEGATIVE

## 2016-10-18 LAB — HIV ANTIBODY (ROUTINE TESTING W REFLEX): HIV Screen 4th Generation wRfx: NONREACTIVE

## 2016-10-18 LAB — HEPATITIS C ANTIBODY: Hep C Virus Ab: <0.1 s/co ratio (ref 0.0–0.9)

## 2016-10-18 LAB — RPR: RPR Ser Ql: NONREACTIVE

## 2016-10-19 ENCOUNTER — Other Ambulatory Visit (INDEPENDENT_AMBULATORY_CARE_PROVIDER_SITE_OTHER): Payer: Self-pay | Admitting: Orthopaedic Surgery

## 2016-10-19 LAB — CYTOLOGY - PAP
CANDIDA VAGINITIS: NEGATIVE
CHLAMYDIA, DNA PROBE: NEGATIVE
Diagnosis: NEGATIVE
Neisseria Gonorrhea: NEGATIVE
Trichomonas: NEGATIVE

## 2016-10-19 NOTE — Telephone Encounter (Signed)
Yes #30

## 2016-10-20 ENCOUNTER — Other Ambulatory Visit (INDEPENDENT_AMBULATORY_CARE_PROVIDER_SITE_OTHER): Payer: Self-pay | Admitting: Orthopaedic Surgery

## 2016-10-20 MED ORDER — TRAMADOL HCL 50 MG PO TABS: ORAL_TABLET | ORAL | 0 refills | Status: DC

## 2016-10-24 ENCOUNTER — Telehealth: Payer: Self-pay | Admitting: *Deleted

## 2016-10-24 NOTE — Telephone Encounter (Signed)
Pt called front office staff and requested test result information.  I spoke with pt and informed her of all test results from 5/1.  She voiced understanding.

## 2016-11-15 ENCOUNTER — Encounter: Payer: Self-pay | Admitting: General Practice

## 2016-11-15 ENCOUNTER — Ambulatory Visit: Payer: Medicaid Other | Admitting: Obstetrics & Gynecology

## 2016-11-15 NOTE — Progress Notes (Unsigned)
Patient no showed for appt today. Can reschedule at convenience

## 2016-11-22 ENCOUNTER — Ambulatory Visit (INDEPENDENT_AMBULATORY_CARE_PROVIDER_SITE_OTHER): Payer: Medicaid Other | Admitting: Family Medicine

## 2016-11-22 ENCOUNTER — Encounter: Payer: Self-pay | Admitting: Family Medicine

## 2016-11-22 VITALS — BP 120/82 | HR 91 | Temp 98.3°F | Wt 288.4 lb

## 2016-11-22 DIAGNOSIS — R7309 Other abnormal glucose: Secondary | ICD-10-CM

## 2016-11-22 DIAGNOSIS — R631 Polydipsia: Secondary | ICD-10-CM | POA: Diagnosis not present

## 2016-11-22 DIAGNOSIS — R350 Frequency of micturition: Secondary | ICD-10-CM

## 2016-11-22 LAB — CBC WITH DIFFERENTIAL/PLATELET
BASOS ABS: 70 {cells}/uL (ref 0–200)
Basophils Relative: 1 %
EOS ABS: 140 {cells}/uL (ref 15–500)
EOS PCT: 2 %
HCT: 39.9 % (ref 35.0–45.0)
Hemoglobin: 12.8 g/dL (ref 11.7–15.5)
LYMPHS PCT: 36 %
Lymphs Abs: 2520 cells/uL (ref 850–3900)
MCH: 27.8 pg (ref 27.0–33.0)
MCHC: 32.1 g/dL (ref 32.0–36.0)
MCV: 86.6 fL (ref 80.0–100.0)
MONOS PCT: 4 %
MPV: 10.4 fL (ref 7.5–12.5)
Monocytes Absolute: 280 cells/uL (ref 200–950)
NEUTROS ABS: 3990 {cells}/uL (ref 1500–7800)
NEUTROS PCT: 57 %
PLATELETS: 321 10*3/uL (ref 140–400)
RBC: 4.61 MIL/uL (ref 3.80–5.10)
RDW: 14.6 % (ref 11.0–15.0)
WBC: 7 10*3/uL (ref 4.0–10.5)

## 2016-11-22 LAB — BASIC METABOLIC PANEL
BUN: 7 mg/dL (ref 7–25)
CALCIUM: 9.1 mg/dL (ref 8.6–10.2)
CO2: 23 mmol/L (ref 20–31)
CREATININE: 0.76 mg/dL (ref 0.50–1.10)
Chloride: 105 mmol/L (ref 98–110)
Glucose, Bld: 106 mg/dL — ABNORMAL HIGH (ref 65–99)
Potassium: 4.1 mmol/L (ref 3.5–5.3)
SODIUM: 138 mmol/L (ref 135–146)

## 2016-11-22 LAB — POCT URINALYSIS DIPSTICK
BILIRUBIN UA: NEGATIVE
Glucose, UA: NEGATIVE
KETONES UA: NEGATIVE
LEUKOCYTES UA: NEGATIVE
Nitrite, UA: NEGATIVE
PH UA: 6 (ref 5.0–8.0)
PROTEIN UA: NEGATIVE
RBC UA: NEGATIVE
Urobilinogen, UA: NEGATIVE E.U./dL — AB

## 2016-11-22 LAB — POCT GLYCOSYLATED HEMOGLOBIN (HGB A1C)

## 2016-11-22 NOTE — Progress Notes (Signed)
   Subjective:    Patient ID: Deborah CapuchinMichaela S Little, female    DOB: 10/30/1985, 31 y.o.   MRN: [id number]  HPI Chief Complaint  Patient presents with  . urinating alot    urinating alot since early april,   She is here with complaints of urinary frequency for at least 6 - 8 weeks. 3-4 episodes of urinating at night. Urinating more frequently during the day also but not as noticeable at nighttime.  States she has noticed mild pelvic pressure. States her urine is light yellow. No hematuria or dysuria. No urinary leakage.  No vaginal discharge.  Denies recurrent UTI history.   She also reports increased thirst and fluid intake. She reports drinking more soda, sweet tea and energy drinks.   History of pre- diabetes with her A1c being in normal range last year.   Denies fever, chills, headache, dizziness, vision changes, unexplained weight loss, chest pain, palpitations, abdominal pain, back pain, N/V/D, or peripheral edema.   IUD.   Reviewed allergies, medications, past medical, surgical,  and social history.   Review of Systems Pertinent positives and negatives in the history of present illness.     Objective:   Physical Exam BP 120/82   Pulse 91   Temp 98.3 F (36.8 C) (Oral)   Wt 288 lb 6.4 oz (130.8 kg)   SpO2 98%   BMI 45.17 kg/m   Alert and in no distress.  Pharyngeal area is normal. Neck is supple without adenopathy or thyromegaly. Cardiac exam shows a regular sinus rhythm without murmurs or gallops. Lungs are clear to auscultation. Abdomen soft, non distended, non tender, normal BS. Extremities without edema, normal pulses.       Assessment & Plan:  Frequent urination - Plan: Urinalysis Dipstick, CBC with Differential/Platelet, Basic metabolic panel, POCT glycosylated hemoglobin (Hb A1C), Urine culture  Increased thirst - Plan: Basic metabolic panel, POCT glycosylated hemoglobin (Hb A1C)  Elevated hemoglobin A1c - Plan: Basic metabolic panel, POCT glycosylated hemoglobin  (Hb A1C)  UA dipstick: spec grav 1.030, neg  Hemoglobin A1c 5.6%   Advised her to cut back on sugary drinks since she has been consuming more sodas, sweet tea and energy drinks lately. She may cut back on fluid intake after supper time as well.  Her A1c is still in normal range.  Will culture her urine to rule out UTI.  Plan to check labs and follow up.

## 2016-11-22 NOTE — Patient Instructions (Signed)
Cut back on sugary drinks. We will call you with lab results.

## 2016-11-23 LAB — URINE CULTURE: Organism ID, Bacteria: NO GROWTH

## 2017-01-23 ENCOUNTER — Other Ambulatory Visit (INDEPENDENT_AMBULATORY_CARE_PROVIDER_SITE_OTHER): Payer: Self-pay | Admitting: Orthopaedic Surgery

## 2017-01-23 ENCOUNTER — Other Ambulatory Visit: Payer: Self-pay | Admitting: Family Medicine

## 2017-01-23 DIAGNOSIS — L309 Dermatitis, unspecified: Secondary | ICD-10-CM

## 2017-01-24 NOTE — Telephone Encounter (Signed)
Is this okay to refill for her ezcema. Last refilled in april

## 2017-01-24 NOTE — Telephone Encounter (Signed)
ok 

## 2017-02-15 ENCOUNTER — Other Ambulatory Visit: Payer: Self-pay

## 2017-02-15 ENCOUNTER — Telehealth: Payer: Self-pay | Admitting: Family Medicine

## 2017-02-15 DIAGNOSIS — L309 Dermatitis, unspecified: Secondary | ICD-10-CM

## 2017-02-15 MED ORDER — TRIAMCINOLONE ACETONIDE 0.1 % EX CREA: TOPICAL_CREAM | Freq: Two times a day (BID) | CUTANEOUS | 0 refills | Status: DC

## 2017-02-15 NOTE — Telephone Encounter (Signed)
PT called for refill of triamcinolone cream to [facility]    Pt ph [phone]

## 2017-02-15 NOTE — Telephone Encounter (Signed)
Can pt have a refill on this 

## 2017-02-15 NOTE — Telephone Encounter (Signed)
Yes please send refill

## 2017-02-15 NOTE — Telephone Encounter (Signed)
Sent refill

## 2017-03-09 ENCOUNTER — Telehealth (INDEPENDENT_AMBULATORY_CARE_PROVIDER_SITE_OTHER): Payer: Self-pay | Admitting: Orthopaedic Surgery

## 2017-03-09 NOTE — Telephone Encounter (Signed)
Pleas advise.

## 2017-03-09 NOTE — Telephone Encounter (Signed)
75 mg bid prn pain. #30

## 2017-03-09 NOTE — Telephone Encounter (Signed)
Patient called asking for a lyrica RX. CB # [id number]

## 2017-03-12 MED ORDER — PREGABALIN 75 MG PO CAPS: ORAL_CAPSULE | ORAL | 0 refills | Status: DC

## 2017-03-12 NOTE — Addendum Note (Signed)
Addended by: Albertina Parr on: 03/12/2017 02:18 PM   Modules accepted: Orders

## 2017-03-12 NOTE — Telephone Encounter (Signed)
CALLED INTO PHARM, LMOM

## 2017-03-19 ENCOUNTER — Encounter: Payer: Self-pay | Admitting: Internal Medicine

## 2017-03-22 ENCOUNTER — Other Ambulatory Visit: Payer: Self-pay | Admitting: Gastroenterology

## 2017-03-22 DIAGNOSIS — R112 Nausea with vomiting, unspecified: Secondary | ICD-10-CM

## 2017-03-22 DIAGNOSIS — R1011 Right upper quadrant pain: Secondary | ICD-10-CM | POA: Diagnosis not present

## 2017-03-22 DIAGNOSIS — K5904 Chronic idiopathic constipation: Secondary | ICD-10-CM | POA: Diagnosis not present

## 2017-03-22 DIAGNOSIS — Z7689 Persons encountering health services in other specified circumstances: Secondary | ICD-10-CM | POA: Diagnosis not present

## 2017-03-22 DIAGNOSIS — R1013 Epigastric pain: Secondary | ICD-10-CM | POA: Diagnosis not present

## 2017-03-22 NOTE — Progress Notes (Signed)
Deborah Flanagin MD 

## 2017-04-02 ENCOUNTER — Encounter: Payer: Self-pay | Admitting: Internal Medicine

## 2017-04-05 ENCOUNTER — Encounter (HOSPITAL_COMMUNITY)
Admission: RE | Admit: 2017-04-05 | Discharge: 2017-04-05 | Disposition: A | Discharge status: Home / Self Care | Payer: 59 | Source: Ambulatory Visit | Attending: Gastroenterology | Admitting: Gastroenterology

## 2017-04-05 DIAGNOSIS — R112 Nausea with vomiting, unspecified: Secondary | ICD-10-CM | POA: Diagnosis not present

## 2017-04-05 DIAGNOSIS — R1013 Epigastric pain: Secondary | ICD-10-CM | POA: Diagnosis not present

## 2017-04-05 MED ORDER — TECHNETIUM TC 99M MEBROFENIN IV KIT
5.0000 | PACK | Freq: Once | INTRAVENOUS | Status: AC | PRN
  Administered 2017-04-05: 5 via INTRAVENOUS

## 2017-04-10 ENCOUNTER — Telehealth (INDEPENDENT_AMBULATORY_CARE_PROVIDER_SITE_OTHER): Payer: Self-pay | Admitting: Orthopaedic Surgery

## 2017-04-10 ENCOUNTER — Ambulatory Visit: Payer: Self-pay | Admitting: Internal Medicine

## 2017-04-10 NOTE — Telephone Encounter (Signed)
Ok  #30 

## 2017-04-10 NOTE — Telephone Encounter (Signed)
Patient called needing Rx refilled (Tramadol) Patient advised the pain is still there and the Tramdol works ok. Patient asked if there is anything stronger she can take? The number to contact patient is [phone]

## 2017-04-10 NOTE — Telephone Encounter (Signed)
There are stronger pain meds but they would be narcotic painkillers which we do not prescribe for chronic issues.

## 2017-04-10 NOTE — Telephone Encounter (Signed)
Called to advise on message. She said then she would like a Rf on Tramadol.  Uses [facility]

## 2017-04-10 NOTE — Telephone Encounter (Signed)
See message below °

## 2017-04-11 MED ORDER — TRAMADOL HCL 50 MG PO TABS: ORAL_TABLET | ORAL | 0 refills | Status: DC

## 2017-04-11 NOTE — Telephone Encounter (Signed)
Called into pharm  

## 2017-04-12 ENCOUNTER — Encounter: Payer: Self-pay | Admitting: Family Medicine

## 2017-04-16 DIAGNOSIS — R110 Nausea: Secondary | ICD-10-CM | POA: Diagnosis not present

## 2017-04-16 DIAGNOSIS — R1013 Epigastric pain: Secondary | ICD-10-CM | POA: Diagnosis not present

## 2017-04-16 DIAGNOSIS — R1011 Right upper quadrant pain: Secondary | ICD-10-CM | POA: Diagnosis not present

## 2017-04-28 ENCOUNTER — Other Ambulatory Visit (INDEPENDENT_AMBULATORY_CARE_PROVIDER_SITE_OTHER): Payer: Self-pay | Admitting: Orthopaedic Surgery

## 2017-04-30 NOTE — Telephone Encounter (Signed)
approve

## 2017-05-15 ENCOUNTER — Ambulatory Visit: Payer: Self-pay | Admitting: Family Medicine

## 2017-05-16 ENCOUNTER — Ambulatory Visit (INDEPENDENT_AMBULATORY_CARE_PROVIDER_SITE_OTHER): Payer: 59 | Admitting: Medical

## 2017-05-16 ENCOUNTER — Encounter: Payer: Self-pay | Admitting: Medical

## 2017-05-16 VITALS — BP 126/84 | HR 86 | Temp 98.9°F | Wt 295.4 lb

## 2017-05-16 DIAGNOSIS — J029 Acute pharyngitis, unspecified: Secondary | ICD-10-CM

## 2017-05-16 DIAGNOSIS — R05 Cough: Secondary | ICD-10-CM | POA: Diagnosis not present

## 2017-05-16 DIAGNOSIS — R059 Cough, unspecified: Secondary | ICD-10-CM

## 2017-05-16 DIAGNOSIS — J988 Other specified respiratory disorders: Secondary | ICD-10-CM | POA: Diagnosis not present

## 2017-05-16 MED ORDER — HYDROCODONE-HOMATROPINE 5-1.5 MG/5ML PO SYRP: 5.0000 mL | ORAL_SOLUTION | Freq: Three times a day (TID) | ORAL | 0 refills | Status: DC | PRN

## 2017-05-16 MED ORDER — AMOXICILLIN 875 MG PO TABS: 875.0000 mg | ORAL_TABLET | Freq: Two times a day (BID) | ORAL | 0 refills | Status: DC

## 2017-05-16 NOTE — Progress Notes (Signed)
  Subjective:  Deborah Little is a [age] y.o. female who presents for lingering cold over 2 weeks.  At the end of the first week was improving then symptoms seemed to recur all over again.   Having a lot of cough, congestion, some sore throat.  Is blowing out some green mucous from nose.  No fever.  No NVD.   No wheezing or SOB.   Is trying some mucinex.   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  No other aggravating or relieving factors.  No other c/o.  The following portions of the patient's history were reviewed and updated as appropriate: allergies, current medications, past family history, past medical history, past social history, past surgical history and problem list.  ROS as in subjective  Past Medical History:  Diagnosis Date  . Diabetes mellitus without complication (HCC)   . Environmental allergies    prior testing 2008;  Dr. Donnybrook CallasSharma  . IUD (intrauterine device) in place 06/2010   mirena  . Obesity      Objective: BP 126/84   Pulse 86   Temp 98.9 F (37.2 C)   Wt 295 lb 6.4 oz (134 kg)   SpO2 98%   BMI 46.27 kg/m   General appearance: Alert, WD/WN, no distress                             Skin: warm, no rash, no diaphoresis                           Head: no sinus tenderness                            Eyes: conjunctiva normal, corneas clear, PERRLA                            Ears: pearly TMs, external ear canals normal                          Nose: septum midline, turbinates swollen, with erythema and mucoid discharge             Mouth/throat: MMM, tongue normal, mild pharyngeal erythema, tonsils 2+                           Neck: supple, no adenopathy, no thyromegaly, nontender                       Lungs: -bronchial breath sounds, no rhonchi, no wheezes, no rales                Extremities: no edema, nontender      Assessment: Encounter Diagnoses  Name Primary?  . Cough Yes  . Respiratory tract infection   . Sore throat      Plan:    Medication orders today include: amoxicillin, hycodan syrup.   Discussed diagnosis and treatment.  Suggested symptomatic OTC remedies for cough and congestion.  Tylenol or Ibuprofen OTC for fever and malaise.  Call/return in 2-3 days if symptoms are worse or not improving.  Advised that cough may linger even after the infection is improved.

## 2017-05-16 NOTE — Patient Instructions (Signed)
Recommendations  Begin Amoxicillin antiboit  Rest  Hydrate well with water  You can use the Hycodan cough syrup for worse cough or OTC Robitusing othwrise    Pharyngitis Pharyngitis is a sore throat (pharynx). There is redness, pain, and swelling of your throat. HOME CARE   Drink enough fluids to keep your pee (urine) clear or pale yellow.  Only take medicine as told by your doctor.  You may get sick again if you do not take medicine as told. Finish your medicines, even if you start to feel better.  Do not take aspirin.  Rest.  Rinse your mouth (gargle) with salt water ( tsp of salt per 1 qt of water) every 1-2 hours. This will help the pain.  If you are not at risk for choking, you can suck on hard candy or sore throat lozenges. GET HELP IF:  You have large, tender lumps on your neck.  You have a rash.  You cough up green, yellow-brown, or bloody spit. GET HELP RIGHT AWAY IF:   You have a stiff neck.  You drool or cannot swallow liquids.  You throw up (vomit) or are not able to keep medicine or liquids down.  You have very bad pain that does not go away with medicine.  You have problems breathing (not from a stuffy nose). MAKE SURE YOU:   Understand these instructions.  Will watch your condition.  Will get help right away if you are not doing well or get worse. Document Released: 11/22/2007 Document Revised: 03/26/2013 Document Reviewed: 02/10/2013 [facility] Patient Information 2015 [place]. This information is not intended to replace advice given to you by your health care provider. Make sure you discuss any questions you have with your health care provider.   Using Saline Nose Drops with Bulb Syringe A bulb syringe is used to clear your nose. You may use it when you have a stuffy nose, nasal congestion, sinus pressure, or sneezing.   SALINE SOLUTION You can buy nose drops at your local drug store. You can also make nose drops yourself. Mix 1  cup of water with  teaspoon of salt. Stir. Store this mixture at room temperature. Make a new batch daily.  USE THE BULB IN COMBINATION WITH SALINE NOSE DROPS  Squeeze the air out of the bulb before suctioning the saline mixture.  While still squeezing the bulb flat, place the tip of the bulb into the saline mixture.  Let air come back into the bulb.  This will suction up the saline mixture.  Gently flush one nostril at a time.  Salt water nose drops will then moisten your  congested nose and loosen secretions before suctioning.  Use the bulb syringe as directed below to suction.  USING THE BULB SYRINGE TO SUCTION  While still squeezing the bulb flat, place the tip of the bulb into a nostril. Let air come back into the bulb. The suction will pull snot out of the nose and into the bulb.  Repeat on the other nostril.  Squeeze syringe several times into a tissue.  CLEANING THE BULB SYRINGE  Clean the bulb syringe every day with hot soapy water.  Clean the inside of the bulb by squeezing the bulb while the tip is in soapy water.  Rinse by squeezing the bulb while the tip is in clean hot water.  Store the bulb with the tip side down on paper towel.  HOME CARE INSTRUCTIONS   Use saline nose drops often to keep  the nose open and not stuffy.  Throw away used salt water. Make a new solution every time.  Do not use the same solution and dropper for another person  If you do not prefer to use nasal saline flush, other options include nasal saline spray or the EchoStareti Pot, both of which are available over the counter at your pharmacy.

## 2017-05-17 ENCOUNTER — Telehealth: Payer: Self-pay | Admitting: Family Medicine

## 2017-05-17 NOTE — Telephone Encounter (Signed)
LMTCB

## 2017-05-17 NOTE — Telephone Encounter (Signed)
Tell her to break it up and put it in either mashed potatoes or applesauce and if that does not work you can call in the liquid form but do not offer that right away

## 2017-05-17 NOTE — Telephone Encounter (Signed)
Pt called and states that she can not take the amoxicillin it is to hard to swallow, she is wanting to know if there is something that is coated that she can take, and smaller,states she tried to break them and half but the taste of them was horrible, and her reflux bout made her throw up,  pt uses [id number] - [place], [place] - [address] pt can be reached at [phone] send to both providers since shane has already left for the day

## 2017-05-18 ENCOUNTER — Other Ambulatory Visit: Payer: Self-pay | Admitting: Medical

## 2017-05-18 MED ORDER — AMOXICILLIN 400 MG/5ML PO SUSR: 1000.0000 mg | Freq: Two times a day (BID) | ORAL | 0 refills | Status: DC

## 2017-05-18 NOTE — Telephone Encounter (Signed)
LMTCB

## 2017-05-18 NOTE — Telephone Encounter (Signed)
Pt made aware of rx called in. Deborah Little/RLB

## 2017-05-18 NOTE — Telephone Encounter (Signed)
I sent liquid form of Amoxicillin that should work

## 2017-05-18 NOTE — Telephone Encounter (Signed)
Spoke with pt- she reports that she has already tried to smash the tablet up and put in applesauce. She wants to know if we can call her in something with CAPSULES, instead. [name]/RLB

## 2017-05-31 ENCOUNTER — Ambulatory Visit (INDEPENDENT_AMBULATORY_CARE_PROVIDER_SITE_OTHER): Payer: 59 | Admitting: Obstetrics & Gynecology

## 2017-05-31 ENCOUNTER — Other Ambulatory Visit (HOSPITAL_COMMUNITY)
Admission: RE | Admit: 2017-05-31 | Discharge: 2017-05-31 | Disposition: A | Discharge status: Home / Self Care | Payer: 59 | Source: Ambulatory Visit | Attending: Obstetrics & Gynecology | Admitting: Obstetrics & Gynecology

## 2017-05-31 ENCOUNTER — Encounter: Payer: Self-pay | Admitting: Obstetrics & Gynecology

## 2017-05-31 VITALS — BP 131/86 | HR 92 | Wt 293.4 lb

## 2017-05-31 DIAGNOSIS — N898 Other specified noninflammatory disorders of vagina: Secondary | ICD-10-CM | POA: Insufficient documentation

## 2017-05-31 DIAGNOSIS — R102 Pelvic and perineal pain: Secondary | ICD-10-CM | POA: Diagnosis not present

## 2017-05-31 LAB — POCT PREGNANCY, URINE: Preg Test, Ur: NEGATIVE

## 2017-05-31 MED ORDER — FLUCONAZOLE 150 MG PO TABS: 150.0000 mg | ORAL_TABLET | Freq: Once | ORAL | 0 refills | Status: AC

## 2017-05-31 NOTE — Progress Notes (Signed)
History:  31 y.o. G1P1001 here today for vaginal itching. LMP: irreg with IUD. The pt reports that she took an atbx for a URI.  She took 5/7 days and stopped. She reports vaginal itching and discharge since that time. She is worried about a yeast infection.   She did not comeback for her IUD check because she says she was in pain initially but actually lieks th eIUD at present. She is worried about being pregnant because she fells 'movement all over belly and is not sure if its a baby or her gallbladder.'     The following portions of the patient's history were reviewed and updated as appropriate: allergies, current medications, past family history, past medical history, past social history, past surgical history and problem list.  Review of Systems:  Pertinent items are noted in HPI.   Objective:  Physical Exam Weight 293 lb 6.4 oz (133.1 kg).  CONSTITUTIONAL: Well-developed, well-nourished female in no acute distress.  HENT:  Normocephalic, atraumatic EYES: Conjunctivae and EOM are normal. No scleral icterus.  NECK: Normal range of motion SKIN: Skin is warm and dry. No rash noted. Not diaphoretic.No pallor. NEUROLGIC: Alert and oriented to person, place, and time. Normal coordination.  Pelvic: Normal appearing external genitalia; normal appearing vaginal mucosa and cervix. Small amount of whitish discharge. IUD strings noted.    UPT: neg   Assessment & Plan:  Vaginal discharge and itching after atbx  Wet mount and KOH obtained  Diflucan 150mg  po q day x 1  F/u prn  Total face-to-face time with patient was 20 min.  Greater than 50% was spent in counseling and coordination of care with the patient.   Lillymae Duet L. Harraway-Smith, M.D., Evern CoreFACOG

## 2017-06-01 LAB — CERVICOVAGINAL ANCILLARY ONLY
Bacterial vaginitis: POSITIVE — AB
CANDIDA VAGINITIS: NEGATIVE
CHLAMYDIA, DNA PROBE: NEGATIVE
Neisseria Gonorrhea: NEGATIVE
Trichomonas: NEGATIVE

## 2017-06-04 ENCOUNTER — Other Ambulatory Visit: Payer: Self-pay | Admitting: Obstetrics & Gynecology

## 2017-06-04 DIAGNOSIS — N760 Acute vaginitis: Secondary | ICD-10-CM

## 2017-06-04 DIAGNOSIS — B9689 Other specified bacterial agents as the cause of diseases classified elsewhere: Secondary | ICD-10-CM

## 2017-06-04 MED ORDER — METRONIDAZOLE 500 MG PO TABS: 500.0000 mg | ORAL_TABLET | Freq: Two times a day (BID) | ORAL | 0 refills | Status: DC

## 2017-06-06 ENCOUNTER — Telehealth: Payer: Self-pay | Admitting: General Practice

## 2017-06-06 DIAGNOSIS — N760 Acute vaginitis: Secondary | ICD-10-CM

## 2017-06-06 DIAGNOSIS — B9689 Other specified bacterial agents as the cause of diseases classified elsewhere: Secondary | ICD-10-CM

## 2017-06-06 MED ORDER — METRONIDAZOLE 0.75 % VA GEL
1.0000 | Freq: Every day | VAGINAL | 0 refills | Status: AC
Start: 2017-06-06 — End: 2017-06-11

## 2017-06-06 NOTE — Telephone Encounter (Signed)
Patient called into front office stating she cannot swallow the pills that were prescribed to her & needs something else. Patient states she cannot take the flagyl for her BV. metrogel prescribed per protocol and patient informed. Patient verbalized understanding & asked what causes this. Told patient it is an overgrowth of bacteria in the vagina and that overgrowth could come from many different things like soap or laundry detergent. Emphasized it is not sexually transmitted. Patient verbalized understanding & had no questions.

## 2017-06-08 ENCOUNTER — Encounter: Payer: Self-pay | Admitting: Family Medicine

## 2017-07-04 DIAGNOSIS — M79641 Pain in right hand: Secondary | ICD-10-CM | POA: Diagnosis not present

## 2017-07-09 ENCOUNTER — Other Ambulatory Visit: Payer: Self-pay | Admitting: Surgery

## 2017-07-09 DIAGNOSIS — M25531 Pain in right wrist: Secondary | ICD-10-CM | POA: Diagnosis not present

## 2017-07-09 DIAGNOSIS — K828 Other specified diseases of gallbladder: Secondary | ICD-10-CM | POA: Diagnosis not present

## 2017-07-09 DIAGNOSIS — R531 Weakness: Secondary | ICD-10-CM | POA: Diagnosis not present

## 2017-10-02 ENCOUNTER — Other Ambulatory Visit: Payer: Self-pay

## 2017-10-02 DIAGNOSIS — L309 Dermatitis, unspecified: Secondary | ICD-10-CM

## 2017-10-02 MED ORDER — TRIAMCINOLONE ACETONIDE 0.1 % EX CREA: TOPICAL_CREAM | Freq: Two times a day (BID) | CUTANEOUS | 0 refills | Status: DC

## 2017-10-02 NOTE — Telephone Encounter (Signed)
Patient called for a refill on her Kenalog. Patient has not been seen since 04/2017. Will you refill this medication?

## 2017-10-26 ENCOUNTER — Other Ambulatory Visit (INDEPENDENT_AMBULATORY_CARE_PROVIDER_SITE_OTHER): Payer: Self-pay | Admitting: Orthopaedic Surgery

## 2017-11-16 ENCOUNTER — Other Ambulatory Visit: Payer: Self-pay | Admitting: Surgery

## 2017-11-21 ENCOUNTER — Encounter: Payer: Self-pay | Admitting: Family Medicine

## 2017-11-21 ENCOUNTER — Ambulatory Visit (INDEPENDENT_AMBULATORY_CARE_PROVIDER_SITE_OTHER): Payer: 59 | Admitting: Student

## 2017-11-21 ENCOUNTER — Other Ambulatory Visit (HOSPITAL_COMMUNITY)
Admission: RE | Admit: 2017-11-21 | Discharge: 2017-11-21 | Disposition: A | Discharge status: Home / Self Care | Payer: 59 | Source: Ambulatory Visit | Attending: Student | Admitting: Student

## 2017-11-21 ENCOUNTER — Encounter (HOSPITAL_COMMUNITY)
Admission: RE | Admit: 2017-11-21 | Discharge: 2017-11-21 | Disposition: A | Discharge status: Home / Self Care | Payer: 59 | Source: Ambulatory Visit

## 2017-11-21 ENCOUNTER — Encounter: Payer: Self-pay | Admitting: Student

## 2017-11-21 VITALS — BP 120/67 | HR 87 | Ht 67.0 in | Wt 302.3 lb

## 2017-11-21 DIAGNOSIS — N760 Acute vaginitis: Secondary | ICD-10-CM | POA: Diagnosis not present

## 2017-11-21 DIAGNOSIS — B9689 Other specified bacterial agents as the cause of diseases classified elsewhere: Secondary | ICD-10-CM | POA: Insufficient documentation

## 2017-11-21 DIAGNOSIS — Z01419 Encounter for gynecological examination (general) (routine) without abnormal findings: Secondary | ICD-10-CM | POA: Insufficient documentation

## 2017-11-21 NOTE — Patient Instructions (Addendum)
Health Maintenance, Female Adopting a healthy lifestyle and getting preventive care can go a long way to promote health and wellness. Talk with your health care provider about what schedule of regular examinations is right for you. This is a good chance for you to check in with your provider about disease prevention and staying healthy. In between checkups, there are plenty of things you can do on your own. Experts have done a lot of research about which lifestyle changes and preventive measures are most likely to keep you healthy. Ask your health care provider for more information. Weight and diet Eat a healthy diet  Be sure to include plenty of vegetables, fruits, low-fat dairy products, and lean protein.  Do not eat a lot of foods high in solid fats, added sugars, or salt.  Get regular exercise. This is one of the most important things you can do for your health. ? Most adults should exercise for at least 150 minutes each week. The exercise should increase your heart rate and make you sweat (moderate-intensity exercise). ? Most adults should also do strengthening exercises at least twice a week. This is in addition to the moderate-intensity exercise.  Maintain a healthy weight  Body mass index (BMI) is a measurement that can be used to identify possible weight problems. It estimates body fat based on height and weight. Your health care provider can help determine your BMI and help you achieve or maintain a healthy weight.  For females 20 years of age and older: ? A BMI below 18.5 is considered underweight. ? A BMI of 18.5 to 24.9 is normal. ? A BMI of 25 to 29.9 is considered overweight. ? A BMI of 30 and above is considered obese.  Watch levels of cholesterol and blood lipids  You should start having your blood tested for lipids and cholesterol at 30 years of age, then have this test every 5 years.  You may need to have your cholesterol levels checked more often if: ? Your lipid or  cholesterol levels are high. ? You are older than 30 years of age. ? You are at high risk for heart disease.  Cancer screening Lung Cancer  Lung cancer screening is recommended for adults 55-80 years old who are at high risk for lung cancer because of a history of smoking.  A yearly low-dose CT scan of the lungs is recommended for people who: ? Currently smoke. ? Have quit within the past 15 years. ? Have at least a 30-pack-year history of smoking. A pack year is smoking an average of one pack of cigarettes a day for 1 year.  Yearly screening should continue until it has been 15 years since you quit.  Yearly screening should stop if you develop a health problem that would prevent you from having lung cancer treatment.  Breast Cancer  Practice breast self-awareness. This means understanding how your breasts normally appear and feel.  It also means doing regular breast self-exams. Let your health care provider know about any changes, no matter how small.  If you are in your 20s or 30s, you should have a clinical breast exam (CBE) by a health care provider every 1-3 years as part of a regular health exam.  If you are 40 or older, have a CBE every year. Also consider having a breast X-ray (mammogram) every year.  If you have a family history of breast cancer, talk to your health care provider about genetic screening.  If you are at high risk   for breast cancer, talk to your health care provider about having an MRI and a mammogram every year.  Breast cancer gene (BRCA) assessment is recommended for women who have family members with BRCA-related cancers. BRCA-related cancers include: ? Breast. ? Ovarian. ? Tubal. ? Peritoneal cancers.  Results of the assessment will determine the need for genetic counseling and BRCA1 and BRCA2 testing.  Cervical Cancer Your health care provider may recommend that you be screened regularly for cancer of the pelvic organs (ovaries, uterus, and  vagina). This screening involves a pelvic examination, including checking for microscopic changes to the surface of your cervix (Pap test). You may be encouraged to have this screening done every 3 years, beginning at age 22.  For women ages 56-65, health care providers may recommend pelvic exams and Pap testing every 3 years, or they may recommend the Pap and pelvic exam, combined with testing for human papilloma virus (HPV), every 5 years. Some types of HPV increase your risk of cervical cancer. Testing for HPV may also be done on women of any age with unclear Pap test results.  Other health care providers may not recommend any screening for nonpregnant women who are considered low risk for pelvic cancer and who do not have symptoms. Ask your health care provider if a screening pelvic exam is right for you.  If you have had past treatment for cervical cancer or a condition that could lead to cancer, you need Pap tests and screening for cancer for at least 20 years after your treatment. If Pap tests have been discontinued, your risk factors (such as having a new sexual partner) need to be reassessed to determine if screening should resume. Some women have medical problems that increase the chance of getting cervical cancer. In these cases, your health care provider may recommend more frequent screening and Pap tests.  Colorectal Cancer  This type of cancer can be detected and often prevented.  Routine colorectal cancer screening usually begins at 30 years of age and continues through 30 years of age.  Your health care provider may recommend screening at an earlier age if you have risk factors for colon cancer.  Your health care provider may also recommend using home test kits to check for hidden blood in the stool.  A small camera at the end of a tube can be used to examine your colon directly (sigmoidoscopy or colonoscopy). This is done to check for the earliest forms of colorectal  cancer.  Routine screening usually begins at age 33.  Direct examination of the colon should be repeated every 5-10 years through 30 years of age. However, you may need to be screened more often if early forms of precancerous polyps or small growths are found.  Skin Cancer  Check your skin from head to toe regularly.  Tell your health care provider about any new moles or changes in moles, especially if there is a change in a mole's shape or color.  Also tell your health care provider if you have a mole that is larger than the size of a pencil eraser.  Always use sunscreen. Apply sunscreen liberally and repeatedly throughout the day.  Protect yourself by wearing long sleeves, pants, a wide-brimmed hat, and sunglasses whenever you are outside.  Heart disease, diabetes, and high blood pressure  High blood pressure causes heart disease and increases the risk of stroke. High blood pressure is more likely to develop in: ? People who have blood pressure in the high end of  the normal range (130-139/85-89 mm Hg). ? People who are overweight or obese. ? People who are African American.  If you are 21-29 years of age, have your blood pressure checked every 3-5 years. If you are 3 years of age or older, have your blood pressure checked every year. You should have your blood pressure measured twice-once when you are at a hospital or clinic, and once when you are not at a hospital or clinic. Record the average of the two measurements. To check your blood pressure when you are not at a hospital or clinic, you can use: ? An automated blood pressure machine at a pharmacy. ? A home blood pressure monitor.  If you are between 17 years and 37 years old, ask your health care provider if you should take aspirin to prevent strokes.  Have regular diabetes screenings. This involves taking a blood sample to check your fasting blood sugar level. ? If you are at a normal weight and have a low risk for diabetes,  have this test once every three years after 30 years of age. ? If you are overweight and have a high risk for diabetes, consider being tested at a younger age or more often. Preventing infection Hepatitis B  If you have a higher risk for hepatitis B, you should be screened for this virus. You are considered at high risk for hepatitis B if: ? You were born in a country where hepatitis B is common. Ask your health care provider which countries are considered high risk. ? Your parents were born in a high-risk country, and you have not been immunized against hepatitis B (hepatitis B vaccine). ? You have HIV or AIDS. ? You use needles to inject street drugs. ? You live with someone who has hepatitis B. ? You have had sex with someone who has hepatitis B. ? You get hemodialysis treatment. ? You take certain medicines for conditions, including cancer, organ transplantation, and autoimmune conditions.  Hepatitis C  Blood testing is recommended for: ? Everyone born from [birth year] through 1963. ? Anyone with known risk factors for hepatitis C.  Sexually transmitted infections (STIs)  You should be screened for sexually transmitted infections (STIs) including gonorrhea and chlamydia if: ? You are sexually active and are younger than 30 years of age. ? You are older than 30 years of age and your health care provider tells you that you are at risk for this type of infection. ? Your sexual activity has changed since you were last screened and you are at an increased risk for chlamydia or gonorrhea. Ask your health care provider if you are at risk.  If you do not have HIV, but are at risk, it may be recommended that you take a prescription medicine daily to prevent HIV infection. This is called pre-exposure prophylaxis (PrEP). You are considered at risk if: ? You are sexually active and do not regularly use condoms or know the HIV status of your partner(s). ? You take drugs by injection. ? You are  sexually active with a partner who has HIV.  Talk with your health care provider about whether you are at high risk of being infected with HIV. If you choose to begin PrEP, you should first be tested for HIV. You should then be tested every 3 months for as long as you are taking PrEP. Pregnancy  If you are premenopausal and you may become pregnant, ask your health care provider about preconception counseling.  If you may become  pregnant, take 400 to 800 micrograms (mcg) of folic acid every day.  If you want to prevent pregnancy, talk to your health care provider about birth control (contraception). Osteoporosis and menopause  Osteoporosis is a disease in which the bones lose minerals and strength with aging. This can result in serious bone fractures. Your risk for osteoporosis can be identified using a bone density scan.  If you are 65 years of age or older, or if you are at risk for osteoporosis and fractures, ask your health care provider if you should be screened.  Ask your health care provider whether you should take a calcium or vitamin D supplement to lower your risk for osteoporosis.  Menopause may have certain physical symptoms and risks.  Hormone replacement therapy may reduce some of these symptoms and risks. Talk to your health care provider about whether hormone replacement therapy is right for you. Follow these instructions at home:  Schedule regular health, dental, and eye exams.  Stay current with your immunizations.  Do not use any tobacco products including cigarettes, chewing tobacco, or electronic cigarettes.  If you are pregnant, do not drink alcohol.  If you are breastfeeding, limit how much and how often you drink alcohol.  Limit alcohol intake to no more than 1 drink per day for nonpregnant women. One drink equals 12 ounces of beer, 5 ounces of wine, or 1 ounces of hard liquor.  Do not use street drugs.  Do not share needles.  Ask your health care  provider for help if you need support or information about quitting drugs.  Tell your health care provider if you often feel depressed.  Tell your health care provider if you have ever been abused or do not feel safe at home. This information is not intended to replace advice given to you by your health care provider. Make sure you discuss any questions you have with your health care provider. Document Released: 12/19/2010 Document Revised: 11/11/2015 Document Reviewed: 03/09/2015 Elsevier Interactive Patient Education  2018 [employer]  Bacterial Vaginosis Bacterial vaginosis is a vaginal infection that occurs when the normal balance of bacteria in the vagina is disrupted. It results from an overgrowth of certain bacteria. This is the most common vaginal infection among women ages 15-44. Because bacterial vaginosis increases your risk for STIs (sexually transmitted infections), getting treated can help reduce your risk for chlamydia, gonorrhea, herpes, and HIV (human immunodeficiency virus). Treatment is also important for preventing complications in pregnant women, because this condition can cause an early (premature) delivery. What are the causes? This condition is caused by an increase in harmful bacteria that are normally present in small amounts in the vagina. However, the reason that the condition develops is not fully understood. What increases the risk? The following factors may make you more likely to develop this condition:  Having a new sexual partner or multiple sexual partners.  Having unprotected sex.  Douching.  Having an intrauterine device (IUD).  Smoking.  Drug and alcohol abuse.  Taking certain antibiotic medicines.  Being pregnant.  You cannot get bacterial vaginosis from toilet seats, bedding, swimming pools, or contact with objects around you. What are the signs or symptoms? Symptoms of this condition include:  Grey or white vaginal discharge. The  discharge can also be watery or foamy.  A fish-like odor with discharge, especially after sexual intercourse or during menstruation.  Itching in and around the vagina.  Burning or pain with urination.  Some women with bacterial vaginosis have no   signs or symptoms. How is this diagnosed? This condition is diagnosed based on:  Your medical history.  A physical exam of the vagina.  Testing a sample of vaginal fluid under a microscope to look for a large amount of bad bacteria or abnormal cells. Your health care provider may use a cotton swab or a small wooden spatula to collect the sample.  How is this treated? This condition is treated with antibiotics. These may be given as a pill, a vaginal cream, or a medicine that is put into the vagina (suppository). If the condition comes back after treatment, a second round of antibiotics may be needed. Follow these instructions at home: Medicines  Take over-the-counter and prescription medicines only as told by your health care provider.  Take or use your antibiotic as told by your health care provider. Do not stop taking or using the antibiotic even if you start to feel better. General instructions  If you have a female sexual partner, tell her that you have a vaginal infection. She should see her health care provider and be treated if she has symptoms. If you have a female sexual partner, he does not need treatment.  During treatment: ? Avoid sexual activity until you finish treatment. ? Do not douche. ? Avoid alcohol as directed by your health care provider. ? Avoid breastfeeding as directed by your health care provider.  Drink enough water and fluids to keep your urine clear or pale yellow.  Keep the area around your vagina and rectum clean. ? Wash the area daily with warm water. ? Wipe yourself from front to back after using the toilet.  Keep all follow-up visits as told by your health care provider. This is important. How is this  prevented?  Do not douche.  Wash the outside of your vagina with warm water only.  Use protection when having sex. This includes latex condoms and dental dams.  Limit how many sexual partners you have. To help prevent bacterial vaginosis, it is best to have sex with just one partner (monogamous).  Make sure you and your sexual partner are tested for STIs.  Wear cotton or cotton-lined underwear.  Avoid wearing tight pants and pantyhose, especially during summer.  Limit the amount of alcohol that you drink.  Do not use any products that contain nicotine or tobacco, such as cigarettes and e-cigarettes. If you need help quitting, ask your health care provider.  Do not use illegal drugs. Where to find more information:  Centers for Disease Control and Prevention: www.cdc.gov/std  American Sexual Health Association (ASHA): www.ashastd.org  [place] Department of Health and Human Services, Office on Women's Health: www.womenshealth.gov/ or https://www.womenshealth.gov/a-z-topics/bacterial-vaginosis Contact a health care provider if:  Your symptoms do not improve, even after treatment.  You have more discharge or pain when urinating.  You have a fever.  You have pain in your abdomen.  You have pain during sex.  You have vaginal bleeding between periods. Summary  Bacterial vaginosis is a vaginal infection that occurs when the normal balance of bacteria in the vagina is disrupted.  Because bacterial vaginosis increases your risk for STIs (sexually transmitted infections), getting treated can help reduce your risk for chlamydia, gonorrhea, herpes, and HIV (human immunodeficiency virus). Treatment is also important for preventing complications in pregnant women, because the condition can cause an early (premature) delivery.  This condition is treated with antibiotic medicines. These may be given as a pill, a vaginal cream, or a medicine that is put into the vagina (  suppository). This  information is not intended to replace advice given to you by your health care provider. Make sure you discuss any questions you have with your health care provider. Document Released: 06/05/2005 Document Revised: 10/09/2016 Document Reviewed: 02/19/2016 Elsevier Interactive Patient Education  2018 [employer] Inc.  

## 2017-11-21 NOTE — Progress Notes (Signed)
GYNECOLOGY CLINIC ANNUAL PREVENTATIVE CARE ENCOUNTER NOTE  Subjective:   Deborah Little is a 32 y.o. [id number] female here for a routine annual gynecologic exam.  Current complaints: vaginal discharge, irritation, and odor. Symptoms x 1 week. Reports vulvar irritation and foul smelling discharge. Has not treated symptoms. In monogamous relationship with SO x 5 years. No STI hx with this partner.  Denies abnormal vaginal bleeding, pelvic pain, problems with intercourse or other gynecologic concerns.  She routinely see her PCP & is scheduled for a cholecystectomy next week.    Gynecologic History No LMP recorded. (Menstrual status: IUD). Contraception: IUD Last Pap: 10/2016. Results were: normal Last mammogram: n/a.   Obstetric History OB History  Gravida Para Term Preterm AB Living  1 1 1  0 0 1  SAB TAB Ectopic Multiple Live Births  0 0 0 0 1    # Outcome Date GA Lbr Len/2nd Weight Sex Delivery Anes PTL Lv  1 Term 05/18/11 [id number] 14:17 / 00:47 7 lb 7.6 oz (3.39 kg) M Vag-Spont EPI, Local  LIV     Birth Comments: No prolonged stay; no NICU    Past Medical History:  Diagnosis Date  . Encounter for diagnostic endoscopy 04/16/2017   Normal results  . Environmental allergies    prior testing 2008;  Dr. [place] CallasSharma  . IUD (intrauterine device) in place 06/2010   mirena  . Obesity     Past Surgical History:  Procedure Laterality Date  . WISDOM TOOTH EXTRACTION      Current Outpatient Medications on File Prior to Visit  Medication Sig Dispense Refill  . diclofenac (VOLTAREN) 75 MG EC tablet Take 75 mg by mouth 2 (two) times daily as needed for mild pain or moderate pain.     Marland Kitchen. levonorgestrel (MIRENA) 20 MCG/24HR IUD 1 each by Intrauterine route continuous.    . meloxicam (MOBIC) 7.5 MG tablet Take 7.5 mg by mouth daily as needed for pain.    . traMADol (ULTRAM) 50 MG tablet Take 1 tablet (50 mg total) 3 (three) times daily as needed by mouth. (Patient taking differently: Take 50 mg  by mouth 3 (three) times daily as needed. ) 30 tablet 0  . triamcinolone cream (KENALOG) 0.1 % Apply topically 2 (two) times daily. (Patient taking differently: Apply 1 application topically 2 (two) times daily as needed (eczema). ) 45 g 0  . VOLTAREN 1 % GEL APPLY 2-4 GRAMS TO AFFECTED AREA TWICE DAILY (Patient taking differently: APPLY 2-4 GRAMS TO AFFECTED AREA TWICE DAILY AS NEEDED FOR PAIN) 100 g 3   No current facility-administered medications on file prior to visit.     Allergies  Allergen Reactions  . Fish Allergy Anaphylaxis  . Hydrocodone Nausea And Vomiting  . Other Other (See Comments)    With any antibiotic pt has experiences yeast infections    Social History   Socioeconomic History  . Marital status: Single    Spouse name: Not on file  . Number of children: Not on file  . Years of education: Not on file  . Highest education level: Not on file  Occupational History  . Not on file  Social Needs  . Financial resource strain: Not on file  . Food insecurity:    Worry: Not on file    Inability: Not on file  . Transportation needs:    Medical: Not on file    Non-medical: Not on file  Tobacco Use  . Smoking status: Never Smoker  .  Smokeless tobacco: Never Used  Substance and Sexual Activity  . Alcohol use: Yes    Comment: socially  . Drug use: No  . Sexual activity: Yes    Birth control/protection: IUD  Lifestyle  . Physical activity:    Family History  Problem Relation Age of Onset  . Swallowing difficulties Brother   . Hypertension Father   . Stroke Maternal Grandmother   . Diabetes Neg Hx   . Heart disease Neg Hx     The following portions of the patient's history were reviewed and updated as appropriate: allergies, current medications, past family history, past medical history, past social history, past surgical history and problem list.  Review of Systems Pertinent items noted in HPI and remainder of comprehensive ROS otherwise negative.     Objective:  BP 120/67   Pulse 87   Ht 5\' 7"  (1.702 m)   Wt (!) 302 lb 4.8 oz (137.1 kg)   BMI 47.35 kg/m  CONSTITUTIONAL: Well-developed, well-nourished female in no acute distress.  HENT:  Normocephalic, atraumatic, External right and left ear normal.  EYES: Conjunctivae and EOM are normal. Pupils are equal, round. No scleral icterus.  NECK: Normal range of motion, supple, no masses.  Normal thyroid.  SKIN: Skin is warm and dry. No rash noted. Not diaphoretic. No erythema. No pallor. NEUROLGIC: Alert and oriented to person, place, and time. Normal reflexes, muscle tone coordination. No cranial nerve deficit noted. PSYCHIATRIC: Normal mood and affect. Normal behavior. Normal judgment and thought content. CARDIOVASCULAR: Normal heart rate noted, regular rhythm RESPIRATORY: Clear to auscultation bilaterally. Effort and breath sounds normal, no problems with respiration noted. BREASTS: Symmetric in size. No masses, skin changes, nipple drainage, or lymphadenopathy. ABDOMEN: Soft, normal bowel sounds, no distention noted.  No tenderness, rebound or guarding.  PELVIC: Normal appearing external genitalia; normal appearing vaginal mucosa and cervix.  No abnormal discharge noted. Normal uterine size, no other palpable masses, no uterine or adnexal tenderness. IUD strings visualized.  Assessment:  Annual gynecologic examination without pap smear. STD testing today.    Plan:  1. Encounter for gynecological examination without abnormal finding -Next pap smear due 2021 - Cervicovaginal ancillary only - CBC - HIV antibody - RPR - Hepatitis C Antibody - Hepatitis B Surface AntiBODY    Judeth Horn, NP

## 2017-11-21 NOTE — Progress Notes (Signed)
Pt states is having some discharge & burning, request a STD swab.

## 2017-11-22 ENCOUNTER — Telehealth: Payer: Self-pay | Admitting: General Practice

## 2017-11-22 ENCOUNTER — Encounter (INDEPENDENT_AMBULATORY_CARE_PROVIDER_SITE_OTHER): Payer: Self-pay

## 2017-11-22 ENCOUNTER — Other Ambulatory Visit: Payer: Self-pay | Admitting: Student

## 2017-11-22 DIAGNOSIS — N760 Acute vaginitis: Secondary | ICD-10-CM

## 2017-11-22 DIAGNOSIS — B9689 Other specified bacterial agents as the cause of diseases classified elsewhere: Secondary | ICD-10-CM

## 2017-11-22 LAB — HEPATITIS C ANTIBODY

## 2017-11-22 LAB — HIV ANTIBODY (ROUTINE TESTING W REFLEX): HIV SCREEN 4TH GENERATION: NONREACTIVE

## 2017-11-22 LAB — CERVICOVAGINAL ANCILLARY ONLY
Bacterial vaginitis: POSITIVE — AB
CHLAMYDIA, DNA PROBE: NEGATIVE
Candida vaginitis: NEGATIVE
NEISSERIA GONORRHEA: NEGATIVE
Trichomonas: NEGATIVE

## 2017-11-22 LAB — CBC
HEMOGLOBIN: 11.4 g/dL (ref 11.1–15.9)
Hematocrit: 35.6 % (ref 34.0–46.6)
MCH: 27.6 pg (ref 26.6–33.0)
MCHC: 32 g/dL (ref 31.5–35.7)
MCV: 86 fL (ref 79–97)
PLATELETS: 327 10*3/uL (ref 150–450)
RBC: 4.13 x10E6/uL (ref 3.77–5.28)
RDW: 14.5 % (ref 12.3–15.4)
WBC: 8.3 10*3/uL (ref 3.4–10.8)

## 2017-11-22 LAB — RPR: RPR: NONREACTIVE

## 2017-11-22 LAB — HEPATITIS B SURFACE ANTIBODY,QUALITATIVE: Hep B Surface Ab, Qual: REACTIVE

## 2017-11-22 MED ORDER — METRONIDAZOLE 500 MG PO TABS: 500.0000 mg | ORAL_TABLET | Freq: Two times a day (BID) | ORAL | 0 refills | Status: DC

## 2017-11-22 NOTE — Telephone Encounter (Signed)
Patient called into front office voicing concerns about hep b results in mychart. Discussed with patient the type of test that was ran is for presence of/history of the vaccine. Told patient I will have the lab add on the correct test but it isn't anything to worry about. Patient verbalized understanding & had no questions.

## 2017-11-23 ENCOUNTER — Other Ambulatory Visit: Payer: Self-pay | Admitting: General Practice

## 2017-11-23 DIAGNOSIS — N760 Acute vaginitis: Secondary | ICD-10-CM

## 2017-11-23 DIAGNOSIS — T3695XA Adverse effect of unspecified systemic antibiotic, initial encounter: Secondary | ICD-10-CM

## 2017-11-23 DIAGNOSIS — B379 Candidiasis, unspecified: Secondary | ICD-10-CM

## 2017-11-23 DIAGNOSIS — B9689 Other specified bacterial agents as the cause of diseases classified elsewhere: Secondary | ICD-10-CM

## 2017-11-23 MED ORDER — FLUCONAZOLE 150 MG PO TABS: 150.0000 mg | ORAL_TABLET | Freq: Once | ORAL | 0 refills | Status: AC

## 2017-11-23 MED ORDER — METRONIDAZOLE 0.75 % VA GEL: 1.0000 | Freq: Two times a day (BID) | VAGINAL | 0 refills | Status: DC

## 2017-11-27 ENCOUNTER — Other Ambulatory Visit: Payer: Self-pay

## 2017-11-27 ENCOUNTER — Encounter (HOSPITAL_COMMUNITY): Payer: Self-pay | Admitting: *Deleted

## 2017-11-27 NOTE — Progress Notes (Signed)
CBC done 11/21/17-epic

## 2017-11-28 MED ORDER — DEXTROSE 5 % IV SOLN
3.0000 g | INTRAVENOUS | Status: AC
  Filled 2017-11-28: qty 3000

## 2017-11-28 NOTE — H&P (Signed)
Francisco CapuchinMichaela S Huezo  Location: [facility] Patient #: [id number] DOB: 12/04/1985 Single / Language: [place] / Race: Black or African American Female   History of Present Illness  The patient is a 32 year old female who presents with abdominal pain. this patient is referred by Dr. Charna ElizabethJyothi Mann for biliary dyskinesia. She has been having right upper quadrant abdominal pain with nausea for about the past year. Her attacks are becoming more frequent. She has bloating as well. These occur after fatty meals. The pain is mild to moderate and is described as sharp. She denies jaundice. Abdomen are normal. She had an ultrasound which was unremarkable and showed no evidence of gallstones. She had a HIDA scan showing a 25% gallbladder ejection fraction. She is otherwise without complaints.   Past Surgical History Judithann Sauger(Patricia King, RMA;  No pertinent past surgical history   Diagnostic Studies History Judithann Sauger(Patricia King, [place];  Colonoscopy  never Mammogram  never Pap Smear  1-5 years ago  Allergies Judithann Sauger(Patricia King, RMA; ) No Known Drug Allergies  Allergies Reconciled   Medication History Judithann Sauger(Patricia King, RMA; MetroNIDAZOLE (0.75% Gel, Vaginal) Active. Voltaren (1% Gel, Transdermal) Active. TraMADol HCl (50MG  Tablet, Oral) Active. Mirena (52 MG) (20MCG/24HR IUD, Intrauterine) Active. Medications Reconciled  Social History Judithann Sauger(Patricia King, [place];   Alcohol use  Occasional alcohol use. Caffeine use  Carbonated beverages, Tea. No drug use  Tobacco use  Never smoker.  Family History Judithann Sauger(Patricia King, [place]; Heart disease in female family member before age 465   Pregnancy / Birth History Judithann Sauger(Patricia King, [place];  Age at menarche  15 years. Contraceptive History  Intrauterine device. Gravida  1 Length (months) of breastfeeding  3-6 Maternal age  30-25 Para  1 Regular periods   Other Problems Judithann Sauger(Patricia King, RMA; Anxiety Disorder  Arthritis     Review of Systems  [facility](Patricia King RMA; General Present- Appetite Loss, Fatigue and Weight Gain. Not Present- Chills, Fever, Night Sweats and Weight Loss. Skin Not Present- Change in Wart/Mole, Dryness, Hives, Jaundice, New Lesions, Non-Healing Wounds, Rash and Ulcer. HEENT Present- Seasonal Allergies. Not Present- Earache, Hearing Loss, Hoarseness, Nose Bleed, Oral Ulcers, Ringing in the Ears, Sinus Pain, Sore Throat, Visual Disturbances, Wears glasses/contact lenses and Yellow Eyes. Cardiovascular Present- Leg Cramps and Swelling of Extremities. Not Present- Chest Pain, Difficulty Breathing Lying Down, Palpitations, Rapid Heart Rate and Shortness of Breath. Gastrointestinal Present- Abdominal Pain, Bloating, Constipation, Gets full quickly at meals and Nausea. Not Present- Bloody Stool, Change in Bowel Habits, Chronic diarrhea, Difficulty Swallowing, Excessive gas, Hemorrhoids, Indigestion, Rectal Pain and Vomiting. Female Genitourinary Present- Frequency and Nocturia. Not Present- Painful Urination, Pelvic Pain and Urgency. Musculoskeletal Present- Joint Pain, Joint Stiffness and Swelling of Extremities. Not Present- Back Pain, Muscle Pain and Muscle Weakness. Neurological Present- Numbness and Tingling. Not Present- Decreased Memory, Fainting, Headaches, Seizures, Tremor, Trouble walking and Weakness.  Vitals   Weight: 293.2 lb Height: 67in Body Surface Area: 2.38 m Body Mass Index: 45.92 kg/m  Temp.: 98.73F  Pulse: 119 (Regular)  BP: 130/78 (Sitting, Left Arm, Standard)     Physical Exam (Myrtle Haller A. Magnus IvanBlackman MD;  General Mental Status-Alert. General Appearance-Consistent with stated age. Hydration-Well hydrated. Voice-Normal.  Head and Neck Head-normocephalic, atraumatic with no lesions or palpable masses.  Eye Eyeball - Bilateral-Extraocular movements intact. Sclera/Conjunctiva - Bilateral-No scleral icterus.  Chest and Lung Exam Chest and lung exam reveals -quiet,  even and easy respiratory effort with no use of accessory muscles and on auscultation, normal breath sounds, no adventitious sounds and  normal vocal resonance. Inspection Chest Wall - Normal. Back - normal.  Cardiovascular Cardiovascular examination reveals -on palpation PMI is normal in location and amplitude, no palpable S3 or S4. Normal cardiac borders., normal heart sounds, regular rate and rhythm with no murmurs, carotid auscultation reveals no bruits and normal pedal pulses bilaterally.  Abdomen Inspection Inspection of the abdomen reveals - No Hernias. Skin - Scar - no surgical scars. Palpation/Percussion Palpation and Percussion of the abdomen reveal - Soft, Non Tender, No Rebound tenderness, No Rigidity (guarding) and No hepatosplenomegaly. Auscultation Auscultation of the abdomen reveals - Bowel sounds normal.  Neurologic - Did not examine.  Musculoskeletal - Did not examine.    Assessment & Plan (Calea Hribar A. Magnus Ivan MD;   BILIARY DYSKINESIA [phone])  Impression: This is a patient with biliary dyskinesia and I suspect some chronic cholecystitis. We discussed gallbladder pathology in detail. We discussed biliary dyskinesia. We discussed laparoscopic cholecystectomy versus continued expectant management. I discussed the surgical procedure in detail. We discussed the risks which includes but is not limited to bleeding, infection, injury to surrounding structures, the need to convert to an open procedure, the chances may not resolve her symptoms, cardiopulmonary issues, postoperative recovery, etc. She understands and wished to proceed with surgery which will be scheduled at her convenience

## 2017-11-29 ENCOUNTER — Ambulatory Visit (HOSPITAL_COMMUNITY): Admission: RE | Admit: 2017-11-29 | Payer: 59 | Source: Ambulatory Visit | Admitting: Surgery

## 2017-11-29 ENCOUNTER — Encounter (HOSPITAL_COMMUNITY): Payer: Self-pay | Admitting: Anesthesiology

## 2017-11-29 ENCOUNTER — Telehealth (HOSPITAL_COMMUNITY): Payer: Self-pay | Admitting: *Deleted

## 2017-11-29 SURGERY — LAPAROSCOPIC CHOLECYSTECTOMY
Anesthesia: General

## 2017-11-29 NOTE — Anesthesia Preprocedure Evaluation (Deleted)
Anesthesia Evaluation  Patient identified by MRN, date of birth, ID band Patient awake    Reviewed: Allergy & Precautions, H&P , NPO status , Patient's Chart, lab work & pertinent test results  Airway Mallampati: II  TM Distance: >3 FB Neck ROM: full    Dental  (+) Teeth Intact   Pulmonary    breath sounds clear to auscultation       Cardiovascular  Rhythm:regular Rate:Normal     Neuro/Psych    GI/Hepatic   Endo/Other  Morbid obesity  Renal/GU      Musculoskeletal  (+) Arthritis ,   Abdominal   Peds  Hematology   Anesthesia Other Findings       Reproductive/Obstetrics                             Anesthesia Physical  Anesthesia Plan  ASA: III  Anesthesia Plan: General   Post-op Pain Management:    Induction: Intravenous  PONV Risk Score and Plan: 3 and Ondansetron, Dexamethasone, Treatment may vary due to age or medical condition and Scopolamine patch - Pre-op  Airway Management Planned: Oral ETT  Additional Equipment:   Intra-op Plan:   Post-operative Plan: Extubation in OR  Informed Consent:   Plan Discussed with: CRNA, Anesthesiologist and Surgeon  Anesthesia Plan Comments: (  )        Anesthesia Quick Evaluation

## 2017-12-14 LAB — SPECIMEN STATUS REPORT

## 2017-12-14 LAB — HEPATITIS B E ANTIGEN: Hep B E Ag: NEGATIVE

## 2017-12-18 ENCOUNTER — Other Ambulatory Visit (INDEPENDENT_AMBULATORY_CARE_PROVIDER_SITE_OTHER): Payer: Self-pay | Admitting: Orthopaedic Surgery

## 2017-12-19 NOTE — Telephone Encounter (Signed)
yes

## 2017-12-31 ENCOUNTER — Telehealth: Payer: Self-pay | Admitting: Family Medicine

## 2017-12-31 NOTE — Telephone Encounter (Signed)
Got message from answering service stated that she was wheezing a lot, shortness of breath. Call back number left was the cell # in her chart.  She did not answer at 6:13 when pt was called back.  LMOVM to consider going to urgent care tonight based on the description of symptoms left by on call message, vs calling the office at 8am for appt if able to wait.  Message sent back to answering service stating she didn't answer, VM to go to UC.  Chart reviewed--no h/o asthma, +allergies.  Last visit in our office 04/2017

## 2018-01-01 ENCOUNTER — Encounter: Payer: Self-pay | Admitting: Family Medicine

## 2018-01-01 ENCOUNTER — Ambulatory Visit: Payer: 59 | Admitting: Family Medicine

## 2018-01-01 ENCOUNTER — Ambulatory Visit: Payer: Self-pay | Admitting: Family Medicine

## 2018-01-01 ENCOUNTER — Other Ambulatory Visit: Payer: Self-pay

## 2018-01-01 VITALS — BP 108/78 | HR 96 | Temp 99.4°F | Resp 18 | Ht 68.0 in | Wt 299.4 lb

## 2018-01-01 DIAGNOSIS — J3089 Other allergic rhinitis: Secondary | ICD-10-CM

## 2018-01-01 DIAGNOSIS — R05 Cough: Secondary | ICD-10-CM

## 2018-01-01 DIAGNOSIS — R059 Cough, unspecified: Secondary | ICD-10-CM

## 2018-01-01 MED ORDER — LEVOCETIRIZINE DIHYDROCHLORIDE 5 MG PO TABS: 5.0000 mg | ORAL_TABLET | Freq: Every evening | ORAL | 3 refills | Status: DC

## 2018-01-01 MED ORDER — FLUTICASONE PROPIONATE 50 MCG/ACT NA SUSP: 2.0000 | Freq: Every day | NASAL | 6 refills | Status: DC

## 2018-01-01 NOTE — Telephone Encounter (Signed)
Called in c/o wheezing and a non productive cough that started last Friday.  Yesterday she coughed up a little bit of clear sputum but none today.   Her voice is very raspy sounding and she sounds tight.  Able to talk in complete sentences.   She notices the wheezing more on exertion but also at rest to some degree.   Does not have a history of asthma.  She is establishing care as a new pt with Dr. Creta LevinStallings towards the end of the month.   I scheduled her for today at 11:40 with Dr. Creta LevinStallings due to protocol that she should be seen within 4 hours.   I let pt know only her wheezing and coughing symptoms would be addressed during this visit.   Her physical and other problems (if any) will be addressed when she comes back in for her establish care appt  At the later date in July.   Reason for Disposition . Wheezing is present  Answer Assessment - Initial Assessment Questions 1. ONSET: "When did the cough begin?"      It began last Friday.   I cough a lot during the day.   Yesterday I coughed up a little bit.   It was clear mucus. 2. SEVERITY: "How bad is the cough today?"      Not coughing as bad today.   Yesterday I coughed a lot at work.   Voice is raspy sounding for 2 days now.   I rested on Saturday. 3. RESPIRATORY DISTRESS: "Describe your breathing."      I feel a little short of breath.   I'm wheezing.   My friend and I noticed the wheezing yesterday. 4. FEVER: "Do you have a fever?" If so, ask: "What is your temperature, how was it measured, and when did it start?"     No 5. SPUTUM: "Describe the color of your sputum" (clear, white, yellow, green)     Clear 6. HEMOPTYSIS: "Are you coughing up any blood?" If so ask: "How much?" (flecks, streaks, tablespoons, etc.)     No 7. CARDIAC HISTORY: "Do you have any history of heart disease?" (e.g., heart attack, congestive heart failure)      No 8. LUNG HISTORY: "Do you have any history of lung disease?"  (e.g., pulmonary embolus, asthma,  emphysema)     No 9. PE RISK FACTORS: "Do you have a history of blood clots?" (or: recent major surgery, recent prolonged travel, bedridden)     No 10. OTHER SYMPTOMS: "Do you have any other symptoms?" (e.g., runny nose, wheezing, chest pain)       No sore throat or runny nose.   Just the non productive cough and wheezing and my raspy voice. 11. PREGNANCY: "Is there any chance you are pregnant?" "When was your last menstrual period?"       No.   Had period 2 wks ago. 12. TRAVEL: "Have you traveled out of the country in the last month?" (e.g., travel history, exposures)       No.  Protocols used: COUGH - ACUTE PRODUCTIVE-A-AH

## 2018-01-01 NOTE — Progress Notes (Signed)
Chief Complaint  Patient presents with  . Cough    with wheezing since friday     HPI   Pt reports that she has been wheezing since Friday then started coughing on Sunday The cough is productive of scant phlegm Yesterday she could not cough anything up and reports that she felt like she was going to throw up She felt out of breath with activity and she can hear herself wheezing She states that she feels fine without runny nose or sinus congestion She denies history of asthma, smoking or pets She has seasonal allergies    Past Medical History:  Diagnosis Date  . Arthritis   . Encounter for diagnostic endoscopy 04/16/2017   Normal results  . Environmental allergies    prior testing 2008;  Dr. [place] CallasSharma  . IUD (intrauterine device) in place 06/2010   mirena  . Obesity     Current Outpatient Medications  Medication Sig Dispense Refill  . diclofenac (VOLTAREN) 75 MG EC tablet Take 75 mg by mouth 2 (two) times daily as needed for mild pain or moderate pain.     Marland Kitchen. levonorgestrel (MIRENA) 20 MCG/24HR IUD 1 each by Intrauterine route continuous.    . meloxicam (MOBIC) 7.5 MG tablet Take 7.5 mg by mouth daily as needed for pain.    . traMADol (ULTRAM) 50 MG tablet TAKE 1 TABLET BY MOUTH THREE TIMES DAILY AS NEEDED 30 tablet 0  . triamcinolone cream (KENALOG) 0.1 % Apply topically 2 (two) times daily. (Patient taking differently: Apply 1 application topically 2 (two) times daily as needed (eczema). ) 45 g 0  . VOLTAREN 1 % GEL APPLY 2-4 GRAMS TO AFFECTED AREA TWICE DAILY (Patient taking differently: APPLY 2-4 GRAMS TO AFFECTED AREA TWICE DAILY AS NEEDED FOR PAIN) 100 g 3  . fluticasone (FLONASE) 50 MCG/ACT nasal spray Place 2 sprays into both nostrils daily. 16 g 6  . metroNIDAZOLE (FLAGYL) 500 MG tablet Take 1 tablet (500 mg total) by mouth 2 (two) times daily. (Patient not taking: Reported on 01/01/2018) 14 tablet 0  . metroNIDAZOLE (METROGEL) 0.75 % vaginal gel Place 1 Applicatorful  vaginally 2 (two) times daily. (Patient not taking: Reported on 01/01/2018) 70 g 0   No current facility-administered medications for this visit.     Allergies:  Allergies  Allergen Reactions  . Fish Allergy Anaphylaxis  . Hydrocodone Nausea And Vomiting  . Other Other (See Comments)    With any antibiotic pt has experiences yeast infections    Past Surgical History:  Procedure Laterality Date  . WISDOM TOOTH EXTRACTION      Social History   Socioeconomic History  . Marital status: Single    Spouse name: Not on file  . Number of children: Not on file  . Years of education: Not on file  . Highest education level: Not on file  Occupational History  . Not on file  Social Needs  . Financial resource strain: Not on file  . Food insecurity:    Worry: Not on file    Inability: Not on file  . Transportation needs:    Medical: Not on file    Non-medical: Not on file  Tobacco Use  . Smoking status: Never Smoker  . Smokeless tobacco: Never Used  Substance and Sexual Activity  . Alcohol use: Yes    Comment: socially  . Drug use: No  . Sexual activity: Yes    Birth control/protection: IUD  Lifestyle  . Physical activity:  Days per week: Not on file    Minutes per session: Not on file  . Stress: Not on file  Relationships  . Social connections:    Talks on phone: Not on file    Gets together: Not on file    Attends religious service: Not on file    Active member of club or organization: Not on file    Attends meetings of clubs or organizations: Not on file    Relationship status: Not on file  Other Topics Concern  . Not on file  Social History Narrative  . Not on file    Family History  Problem Relation Age of Onset  . Swallowing difficulties Brother   . Hypertension Father   . Stroke Maternal Grandmother   . Diabetes Neg Hx   . Heart disease Neg Hx      ROS Review of Systems See HPI Constitution: No fevers or chills No malaise No diaphoresis Skin:  No rash or itching Eyes: no blurry vision, no double vision GU: no dysuria or hematuria Neuro: no dizziness or headaches all others reviewed and negative   Objective: Vitals:   01/01/18 1159  BP: 108/78  Pulse: 96  Resp: 18  Temp: 99.4 F (37.4 C)  TempSrc: Oral  SpO2: 98%  Weight: 299 lb 6.4 oz (135.8 kg)  Height: 5\' 8"  (1.727 m)    Physical Exam  General: alert, oriented, in NAD Head: normocephalic, atraumatic, no sinus tenderness Eyes: EOM intact, no scleral icterus or conjunctival injection Ears: TM clear bilaterally Nose: mucosa +erythematous, +edematous Throat: no pharyngeal exudate or erythema Lymph: no posterior auricular, submental or cervical lymph adenopathy Heart: normal rate, normal sinus rhythm, no murmurs Lungs: clear to auscultation bilaterally, no wheezing   Assessment and Plan Makennah was seen today for cough.  Diagnoses and all orders for this visit:  Cough -     fluticasone (FLONASE) 50 MCG/ACT nasal spray; Place 2 sprays into both nostrils daily.  Non-seasonal allergic rhinitis due to other allergic trigger -     fluticasone (FLONASE) 50 MCG/ACT nasal spray; Place 2 sprays into both nostrils daily.   Continue mucinex, xyzal at night Add flonase Download weather bug app to  Monitor air quality  Omari Mcmanaway A Creta Levin

## 2018-01-01 NOTE — Patient Instructions (Addendum)
   IF you received an x-ray today, you will receive an invoice from [facility]. Please contact [facility] at [phone] with questions or concerns regarding your invoice.   IF you received labwork today, you will receive an invoice from [facility]. Please contact [facility] at [phone] with questions or concerns regarding your invoice.   Our billing staff will not be able to assist you with questions regarding bills from these companies.  You will be contacted with the lab results as soon as they are available. The fastest way to get your results is to activate your My Chart account. Instructions are located on the last page of this paperwork. If you have not heard from us regarding the results in 2 weeks, please contact this office.    Allergic Rhinitis, Adult Allergic rhinitis is an allergic reaction that affects the mucous membrane inside the nose. It causes sneezing, a runny or stuffy nose, and the feeling of mucus going down the back of the throat (postnasal drip). Allergic rhinitis can be mild to severe. There are two types of allergic rhinitis:  Seasonal. This type is also called hay fever. It happens only during certain seasons.  Perennial. This type can happen at any time of the year.  What are the causes? This condition happens when the body's defense system (immune system) responds to certain harmless substances called allergens as though they were germs.  Seasonal allergic rhinitis is triggered by pollen, which can come from grasses, trees, and weeds. Perennial allergic rhinitis may be caused by:  House dust mites.  Pet dander.  Mold spores.  What are the signs or symptoms? Symptoms of this condition include:  Sneezing.  Runny or stuffy nose (nasal congestion).  Postnasal drip.  Itchy nose.  Tearing of the eyes.  Trouble sleeping.  Daytime sleepiness.  How is this diagnosed? This condition may be diagnosed based on:  Your medical  history.  A physical exam.  Tests to check for related conditions, such as: ? Asthma. ? Pink eye. ? Ear infection. ? Upper respiratory infection.  Tests to find out which allergens trigger your symptoms. These may include skin or blood tests.  How is this treated? There is no cure for this condition, but treatment can help control symptoms. Treatment may include:  Taking medicines that block allergy symptoms, such as antihistamines. Medicine may be given as a shot, nasal spray, or pill.  Avoiding the allergen.  Desensitization. This treatment involves getting ongoing shots until your body becomes less sensitive to the allergen. This treatment may be done if other treatments do not help.  If taking medicine and avoiding the allergen does not work, new, stronger medicines may be prescribed.  Follow these instructions at home:  Find out what you are allergic to. Common allergens include smoke, dust, and pollen.  Avoid the things you are allergic to. These are some things you can do to help avoid allergens: ? Replace carpet with wood, tile, or vinyl flooring. Carpet can trap dander and dust. ? Do not smoke. Do not allow smoking in your home. ? Change your heating and air conditioning filter at least once a month. ? During allergy season:  Keep windows closed as much as possible.  Plan outdoor activities when pollen counts are lowest. This is usually during the evening hours.  When coming indoors, change clothing and shower before sitting on furniture or bedding.  Take over-the-counter and prescription medicines only as told by your health care provider.  Keep all   follow-up visits as told by your health care provider. This is important. Contact a health care provider if:  You have a fever.  You develop a persistent cough.  You make whistling sounds when you breathe (you wheeze).  Your symptoms interfere with your normal daily activities. Get help right away if:  You  have shortness of breath. Summary  This condition can be managed by taking medicines as directed and avoiding allergens.  Contact your health care provider if you develop a persistent cough or fever.  During allergy season, keep windows closed as much as possible. This information is not intended to replace advice given to you by your health care provider. Make sure you discuss any questions you have with your health care provider. Document Released: 02/28/2001 Document Revised: 07/13/2016 Document Reviewed: 07/13/2016 Elsevier Interactive Patient Education  2018 Elsevier Inc.  

## 2018-01-01 NOTE — Addendum Note (Signed)
Addended by: Collie SiadSTALLINGS, Smt. [place] A on: 01/01/2018 12:19 PM   Modules accepted: Level of Service

## 2018-01-10 ENCOUNTER — Ambulatory Visit: Payer: 59 | Admitting: Family Medicine

## 2018-01-12 ENCOUNTER — Other Ambulatory Visit (INDEPENDENT_AMBULATORY_CARE_PROVIDER_SITE_OTHER): Payer: Self-pay | Admitting: Orthopaedic Surgery

## 2018-02-17 DIAGNOSIS — K828 Other specified diseases of gallbladder: Secondary | ICD-10-CM

## 2018-02-17 HISTORY — DX: Other specified diseases of gallbladder: K82.8

## 2018-02-26 ENCOUNTER — Ambulatory Visit (INDEPENDENT_AMBULATORY_CARE_PROVIDER_SITE_OTHER): Payer: 59 | Admitting: Family Medicine

## 2018-02-26 ENCOUNTER — Encounter (INDEPENDENT_AMBULATORY_CARE_PROVIDER_SITE_OTHER): Payer: Self-pay | Admitting: Family Medicine

## 2018-02-26 VITALS — BP 118/76 | HR 73 | Ht 67.0 in | Wt 290.0 lb

## 2018-02-26 DIAGNOSIS — M2550 Pain in unspecified joint: Secondary | ICD-10-CM

## 2018-02-26 DIAGNOSIS — Z6841 Body Mass Index (BMI) 40.0 and over, adult: Secondary | ICD-10-CM

## 2018-02-26 DIAGNOSIS — R768 Other specified abnormal immunological findings in serum: Secondary | ICD-10-CM

## 2018-02-26 DIAGNOSIS — M79641 Pain in right hand: Secondary | ICD-10-CM | POA: Diagnosis not present

## 2018-02-26 DIAGNOSIS — R7689 Other specified abnormal immunological findings in serum: Secondary | ICD-10-CM

## 2018-02-26 DIAGNOSIS — R6889 Other general symptoms and signs: Secondary | ICD-10-CM | POA: Diagnosis not present

## 2018-02-26 NOTE — Progress Notes (Signed)
Office Visit Note   Patient: Deborah Little           Date of Birth: September 09, 1985           MRN: [id number] Visit Date: 02/26/2018 Requested by: Doristine Bosworth, MD [address] [place], [place] [address] PCP: Doristine Bosworth, MD  Subjective: Chief Complaint  Patient presents with  . Right Hand - Pain    Going to Hand specialist next week.  Having general pain and swelling in a lot of joints. Today feels like she has been hit by a bus, has days that she can't do general activity. Seen Rheumatologist about 1.5 years ago. She  would like labs done.    HPI: She is here with multiple areas of pain.  Symptoms started probably more than 5 years ago.  Right wrist seem to be the first area to bother her, and it still bothers her today but she also gets intermittent pain in the shoulder blades, she also complains of cold toes.  No rash on her skin, no gastrointestinal symptoms.  Family history positive for thyroid dysfunction in her mother, negative for rheumatologic disease.  Patient has a history of positive ANA in the past and vitamin D deficiency.  She has been to the rheumatologist and has had x-rays and MRI scans of her wrist which were unremarkable.  She has had several cortisone injections in her right wrist with temporary relief.              ROS: Otherwise noncontributory.  Objective: Vital Signs: BP 118/76 (BP Location: Left Arm, Patient Position: Sitting)   Pulse 73   Ht 5\' 7"  (1.702 m)   Wt 290 lb (131.5 kg)   BMI 45.42 kg/m   Physical Exam:  Shoulders: Full active range of motion.  Neck range of motion is also full with negative Spurling's test.  She has some tender trigger points in the right rhomboid area. Hands: Slightly prominent thenar eminence is on the right but no warmth or erythema, no tenderness to palpation.  Lourena Simmonds test is negative.  Negative Tinel's at the carpal tunnel.  Negative Phalen's test.  Strength is normal, no synovitis in her joints. Upper and lower  extremity pulses are normal.   Imaging: None today.  MSK-US was done but not recorded, no abnormalities were seen.  Assessment & Plan: 1.  Chronic wrist and multiple joint pain, etiology uncertain. -Repeat labs today.  We will treat vitamin D deficiency if still present.  If ANA is positive, we might try some conservative things to see if we can improve this.   Follow-Up Instructions: No follow-ups on file.     Procedures: None today.   PMFS History: Patient Active Problem List   Diagnosis Date Noted  . Morbid obesity with BMI of 40.0-44.9, adult (HCC) 02/26/2018  . Primary osteoarthritis, right hand 06/08/2016   Past Medical History:  Diagnosis Date  . Arthritis   . Encounter for diagnostic endoscopy 04/16/2017   Normal results  . Environmental allergies    prior testing 2008;  Dr. [place] Callas  . IUD (intrauterine device) in place 06/2010   mirena  . Obesity     Family History  Problem Relation Age of Onset  . Swallowing difficulties Brother   . Hypertension Father   . Stroke Maternal Grandmother   . Diabetes Neg Hx   . Heart disease Neg Hx     Past Surgical History:  Procedure Laterality Date  . WISDOM TOOTH EXTRACTION  Social History   Occupational History  . Not on file  Tobacco Use  . Smoking status: Never Smoker  . Smokeless tobacco: Never Used  Substance and Sexual Activity  . Alcohol use: Yes    Comment: socially  . Drug use: No  . Sexual activity: Yes    Birth control/protection: IUD

## 2018-02-27 ENCOUNTER — Telehealth (INDEPENDENT_AMBULATORY_CARE_PROVIDER_SITE_OTHER): Payer: Self-pay | Admitting: Family Medicine

## 2018-02-27 NOTE — Telephone Encounter (Signed)
Vitamin D is severely low at 9.  We want this to be above 50.  I recommend taking Vitamin D3, 5,000 IU tablets, two of them daily (total 10,000 IU daily) for the next 6 months and then we'll recheck your levels to see if we can switch to a lower maintenance dosage.  This may be contributing to your pain.  CRP, inflammation marker, is elevated.  This can be due to a wide variety of factors.  There are still some labs pending.  If the remainder are normal, then generally the CRP will improve with regular exercise and dietary limitation of processed carbs (breads; pastas; cereals; sugars/sweets).  Again, we'll recheck in 6 months.  Others look good so far.  MJH

## 2018-03-04 ENCOUNTER — Telehealth (INDEPENDENT_AMBULATORY_CARE_PROVIDER_SITE_OTHER): Payer: Self-pay | Admitting: Family Medicine

## 2018-03-04 LAB — COMPREHENSIVE METABOLIC PANEL
AG RATIO: 1.5 (calc) (ref 1.0–2.5)
ALBUMIN MSPROF: 4.3 g/dL (ref 3.6–5.1)
ALT: 17 U/L (ref 6–29)
AST: 19 U/L (ref 10–30)
Alkaline phosphatase (APISO): 51 U/L (ref 33–115)
BUN: 8 mg/dL (ref 7–25)
CO2: 27 mmol/L (ref 20–32)
Calcium: 9 mg/dL (ref 8.6–10.2)
Chloride: 105 mmol/L (ref 98–110)
Creat: 0.85 mg/dL (ref 0.50–1.10)
Globulin: 2.8 g/dL (calc) (ref 1.9–3.7)
Glucose, Bld: 91 mg/dL (ref 65–99)
Potassium: 4.4 mmol/L (ref 3.5–5.3)
Sodium: 138 mmol/L (ref 135–146)
Total Bilirubin: 0.4 mg/dL (ref 0.2–1.2)
Total Protein: 7.1 g/dL (ref 6.1–8.1)

## 2018-03-04 LAB — CBC WITH DIFFERENTIAL/PLATELET
BASOS ABS: 79 {cells}/uL (ref 0–200)
Basophils Relative: 1.2 %
Eosinophils Absolute: 158 cells/uL (ref 15–500)
Eosinophils Relative: 2.4 %
HCT: 37.5 % (ref 35.0–45.0)
HEMOGLOBIN: 12.3 g/dL (ref 11.7–15.5)
Lymphs Abs: 2429 cells/uL (ref 850–3900)
MCH: 27.2 pg (ref 27.0–33.0)
MCHC: 32.8 g/dL (ref 32.0–36.0)
MCV: 83 fL (ref 80.0–100.0)
MONOS PCT: 6.5 %
MPV: 11.2 fL (ref 7.5–12.5)
NEUTROS ABS: 3505 {cells}/uL (ref 1500–7800)
Neutrophils Relative %: 53.1 %
Platelets: 307 10*3/uL (ref 140–400)
RBC: 4.52 10*6/uL (ref 3.80–5.10)
RDW: 13.3 % (ref 11.0–15.0)
TOTAL LYMPHOCYTE: 36.8 %
WBC mixed population: 429 cells/uL (ref 200–950)
WBC: 6.6 10*3/uL (ref 3.8–10.8)

## 2018-03-04 LAB — CYCLIC CITRUL PEPTIDE ANTIBODY, IGG: Cyclic Citrullin Peptide Ab: <16 UNITS

## 2018-03-04 LAB — THYROID PEROXIDASE ANTIBODY: Thyroperoxidase Ab SerPl-aCnc: 2 IU/mL (ref <9)

## 2018-03-04 LAB — THYROID PANEL WITH TSH
Free Thyroxine Index: 1.9 (ref 1.4–3.8)
T3 Uptake: 29 % (ref 22–35)
T4 TOTAL: 6.6 ug/dL (ref 5.1–11.9)
TSH: 0.93 m[IU]/L

## 2018-03-04 LAB — RHEUMATOID FACTOR: Rhuematoid fact SerPl-aCnc: <14 IU/mL (ref <14)

## 2018-03-04 LAB — VITAMIN D 25 HYDROXY (VIT D DEFICIENCY, FRACTURES): Vit D, 25-Hydroxy: 9 ng/mL — ABNORMAL LOW (ref 30–100)

## 2018-03-04 LAB — ANA: Anti Nuclear Antibody(ANA): NEGATIVE

## 2018-03-04 LAB — HIGH SENSITIVITY CRP: hs-CRP: 6.8 mg/L — ABNORMAL HIGH

## 2018-03-04 NOTE — Telephone Encounter (Signed)
All other labs looked good.

## 2018-03-05 ENCOUNTER — Other Ambulatory Visit: Payer: Self-pay | Admitting: Surgery

## 2018-03-07 ENCOUNTER — Other Ambulatory Visit: Payer: Self-pay

## 2018-03-07 ENCOUNTER — Encounter (HOSPITAL_BASED_OUTPATIENT_CLINIC_OR_DEPARTMENT_OTHER): Payer: Self-pay | Admitting: *Deleted

## 2018-03-07 ENCOUNTER — Other Ambulatory Visit (INDEPENDENT_AMBULATORY_CARE_PROVIDER_SITE_OTHER): Payer: Self-pay | Admitting: Family Medicine

## 2018-03-07 MED ORDER — ETODOLAC 400 MG PO TABS: 400.0000 mg | ORAL_TABLET | Freq: Two times a day (BID) | ORAL | 3 refills | Status: DC | PRN

## 2018-03-07 NOTE — Pre-Procedure Instructions (Signed)
To come for anesthesia airway evaluation; to pick up Ensure pre-surgery drink 10 oz. - to drink by 0515 DOS.

## 2018-03-12 ENCOUNTER — Encounter (HOSPITAL_BASED_OUTPATIENT_CLINIC_OR_DEPARTMENT_OTHER)
Admission: RE | Admit: 2018-03-12 | Discharge: 2018-03-12 | Disposition: A | Discharge status: Home / Self Care | Payer: 59 | Source: Ambulatory Visit | Attending: Surgery | Admitting: Surgery

## 2018-03-12 DIAGNOSIS — Z01812 Encounter for preprocedural laboratory examination: Secondary | ICD-10-CM

## 2018-03-12 DIAGNOSIS — Z79899 Other long term (current) drug therapy: Secondary | ICD-10-CM | POA: Diagnosis not present

## 2018-03-12 DIAGNOSIS — F419 Anxiety disorder, unspecified: Secondary | ICD-10-CM | POA: Diagnosis not present

## 2018-03-12 DIAGNOSIS — K8020 Calculus of gallbladder without cholecystitis without obstruction: Secondary | ICD-10-CM | POA: Diagnosis present

## 2018-03-12 LAB — POCT PREGNANCY, URINE: Preg Test, Ur: NEGATIVE

## 2018-03-12 NOTE — H&P (Signed)
Deborah CapuchinMichaela S Russon  Location: [facility] Patient #: [id number] DOB: 08/06/1985 Single / Language: [place] / Race: Black or African American Female   History of Present Illness  The patient is a 32 year old female who presents with abdominal pain. this patient is referred by Dr. Charna ElizabethJyothi Mann for biliary dyskinesia. She has been having right upper quadrant abdominal pain with nausea for about the past year. Her attacks are becoming more frequent. She has bloating as well. These occur after fatty meals. The pain is mild to moderate and is described as sharp. She denies jaundice. Abdomen are normal. She had an ultrasound which was unremarkable and showed no evidence of gallstones. She had a HIDA scan showing a 25% gallbladder ejection fraction. She is otherwise without complaints.   Past Surgical History  No pertinent past surgical history   Diagnostic Studies History  Colonoscopy  never Mammogram  never Pap Smear  1-5 years ago  Allergies  No Known Drug Allergies [07/09/2017]: Allergies Reconciled   Medication History  MetroNIDAZOLE (0.75% Gel, Vaginal) Active. Voltaren (1% Gel, Transdermal) Active. TraMADol HCl (50MG  Tablet, Oral) Active. Mirena (52 MG) (20MCG/24HR IUD, Intrauterine) Active. Medications Reconciled  Social History  Alcohol use  Occasional alcohol use. Caffeine use  Carbonated beverages, Tea. No drug use  Tobacco use  Never smoker.  Family History  Heart disease in female family member before age 30   Pregnancy / Birth History  Age at menarche  15 years. Contraceptive History  Intrauterine device. Gravida  1 Length (months) of breastfeeding  3-6 Maternal age  30-25 Para  1 Regular periods   Other Problems  Anxiety Disorder  Arthritis     Review of Systems  General Present- Appetite Loss, Fatigue and Weight Gain. Not Present- Chills, Fever, Night Sweats and Weight Loss. Skin Not Present- Change in  Wart/Mole, Dryness, Hives, Jaundice, New Lesions, Non-Healing Wounds, Rash and Ulcer. HEENT Present- Seasonal Allergies. Not Present- Earache, Hearing Loss, Hoarseness, Nose Bleed, Oral Ulcers, Ringing in the Ears, Sinus Pain, Sore Throat, Visual Disturbances, Wears glasses/contact lenses and Yellow Eyes. Cardiovascular Present- Leg Cramps and Swelling of Extremities. Not Present- Chest Pain, Difficulty Breathing Lying Down, Palpitations, Rapid Heart Rate and Shortness of Breath. Gastrointestinal Present- Abdominal Pain, Bloating, Constipation, Gets full quickly at meals and Nausea. Not Present- Bloody Stool, Change in Bowel Habits, Chronic diarrhea, Difficulty Swallowing, Excessive gas, Hemorrhoids, Indigestion, Rectal Pain and Vomiting. Female Genitourinary Present- Frequency and Nocturia. Not Present- Painful Urination, Pelvic Pain and Urgency. Musculoskeletal Present- Joint Pain, Joint Stiffness and Swelling of Extremities. Not Present- Back Pain, Muscle Pain and Muscle Weakness. Neurological Present- Numbness and Tingling. Not Present- Decreased Memory, Fainting, Headaches, Seizures, Tremor, Trouble walking and Weakness.  Vitals  Weight: 293.2 lb Height: 67in Body Surface Area: 2.38 m Body Mass Index: 45.92 kg/m  Temp.: 98.21F  Pulse: 119 (Regular)  BP: 130/78 (Sitting, Left Arm, Standard)       Physical Exam General Mental Status-Alert. General Appearance-Consistent with stated age. Hydration-Well hydrated. Voice-Normal.  Head and Neck Head-normocephalic, atraumatic with no lesions or palpable masses.  Eye Eyeball - Bilateral-Extraocular movements intact. Sclera/Conjunctiva - Bilateral-No scleral icterus.  Chest and Lung Exam Chest and lung exam reveals -quiet, even and easy respiratory effort with no use of accessory muscles and on auscultation, normal breath sounds, no adventitious sounds and normal vocal resonance. Inspection Chest Wall -  Normal. Back - normal.  Cardiovascular Cardiovascular examination reveals -on palpation PMI is normal in location and amplitude, no palpable S3  or S4. Normal cardiac borders., normal heart sounds, regular rate and rhythm with no murmurs, carotid auscultation reveals no bruits and normal pedal pulses bilaterally.  Abdomen Inspection Inspection of the abdomen reveals - No Hernias. Skin - Scar - no surgical scars. Palpation/Percussion Palpation and Percussion of the abdomen reveal - Soft, Non Tender, No Rebound tenderness, No Rigidity (guarding) and No hepatosplenomegaly. Auscultation Auscultation of the abdomen reveals - Bowel sounds normal.  Neurologic - Did not examine.  Musculoskeletal - Did not examine.    Assessment & Plan  BILIARY DYSKINESIA (K82.8)  Impression: This is a patient with biliary dyskinesia and I suspect some chronic cholecystitis. We discussed gallbladder pathology in detail. We discussed biliary dyskinesia. We discussed laparoscopic cholecystectomy versus continued expectant management. I discussed the surgical procedure in detail. We discussed the risks which includes but is not limited to bleeding, infection, injury to surrounding structures, the need to convert to an open procedure, the chances may not resolve her symptoms, cardiopulmonary issues, postoperative recovery, etc. She understands and wished to proceed with surgery which will be scheduled at her convenience

## 2018-03-12 NOTE — Progress Notes (Signed)
Anesthesia consult per Dr. Desmond Lopeurk, BMI- 47.06, urine pregnancy test-negative, will proceed with surgery as scheduled.  Ensure pre surgery drink given with instructions to complete by [facility]0515 dos, pt verbalized understanding.

## 2018-03-13 ENCOUNTER — Ambulatory Visit (HOSPITAL_BASED_OUTPATIENT_CLINIC_OR_DEPARTMENT_OTHER): Payer: 59 | Admitting: Anesthesiology

## 2018-03-13 ENCOUNTER — Encounter (HOSPITAL_BASED_OUTPATIENT_CLINIC_OR_DEPARTMENT_OTHER)
Admission: RE | Disposition: A | Discharge status: Home / Self Care | Payer: Self-pay | Source: Ambulatory Visit | Attending: Surgery

## 2018-03-13 ENCOUNTER — Ambulatory Visit (HOSPITAL_BASED_OUTPATIENT_CLINIC_OR_DEPARTMENT_OTHER)
Admission: RE | Admit: 2018-03-13 | Discharge: 2018-03-13 | Disposition: A | Discharge status: Home / Self Care | Payer: 59 | Source: Ambulatory Visit | Attending: Surgery | Admitting: Surgery

## 2018-03-13 ENCOUNTER — Other Ambulatory Visit: Payer: Self-pay

## 2018-03-13 ENCOUNTER — Encounter (HOSPITAL_BASED_OUTPATIENT_CLINIC_OR_DEPARTMENT_OTHER): Payer: Self-pay

## 2018-03-13 DIAGNOSIS — Z79899 Other long term (current) drug therapy: Secondary | ICD-10-CM | POA: Insufficient documentation

## 2018-03-13 DIAGNOSIS — K8020 Calculus of gallbladder without cholecystitis without obstruction: Secondary | ICD-10-CM | POA: Insufficient documentation

## 2018-03-13 DIAGNOSIS — F419 Anxiety disorder, unspecified: Secondary | ICD-10-CM | POA: Insufficient documentation

## 2018-03-13 DIAGNOSIS — K828 Other specified diseases of gallbladder: Secondary | ICD-10-CM | POA: Diagnosis not present

## 2018-03-13 HISTORY — DX: Unspecified osteoarthritis, unspecified site: M19.90

## 2018-03-13 HISTORY — PX: CHOLECYSTECTOMY: SHX55

## 2018-03-13 HISTORY — DX: Other specified diseases of gallbladder: K82.8

## 2018-03-13 SURGERY — LAPAROSCOPIC CHOLECYSTECTOMY
Anesthesia: General | Site: Abdomen

## 2018-03-13 MED ORDER — CLINDAMYCIN PHOSPHATE 900 MG/50ML IV SOLN
INTRAVENOUS | Status: DC | PRN
  Administered 2018-03-13: 900 mg via INTRAVENOUS

## 2018-03-13 MED ORDER — FENTANYL CITRATE (PF) 100 MCG/2ML IJ SOLN
INTRAMUSCULAR | Status: AC
  Filled 2018-03-13: qty 2

## 2018-03-13 MED ORDER — OXYCODONE HCL 5 MG PO TABS
5.0000 mg | ORAL_TABLET | Freq: Once | ORAL | Status: AC | PRN
  Administered 2018-03-13: 5 mg via ORAL

## 2018-03-13 MED ORDER — CHLORHEXIDINE GLUCONATE CLOTH 2 % EX PADS: 6.0000 | MEDICATED_PAD | Freq: Once | CUTANEOUS | Status: DC

## 2018-03-13 MED ORDER — MIDAZOLAM HCL 2 MG/2ML IJ SOLN
INTRAMUSCULAR | Status: AC
  Filled 2018-03-13: qty 2

## 2018-03-13 MED ORDER — ACETAMINOPHEN 500 MG PO TABS
ORAL_TABLET | ORAL | Status: AC
  Filled 2018-03-13: qty 2

## 2018-03-13 MED ORDER — ONDANSETRON HCL 4 MG/2ML IJ SOLN
INTRAMUSCULAR | Status: AC
  Filled 2018-03-13: qty 2

## 2018-03-13 MED ORDER — CEFAZOLIN SODIUM-DEXTROSE 2-4 GM/100ML-% IV SOLN
INTRAVENOUS | Status: AC
  Filled 2018-03-13: qty 100

## 2018-03-13 MED ORDER — ACETAMINOPHEN 500 MG PO TABS
1000.0000 mg | ORAL_TABLET | ORAL | Status: AC
  Administered 2018-03-13: 1000 mg via ORAL

## 2018-03-13 MED ORDER — OXYCODONE HCL 5 MG PO TABS
ORAL_TABLET | ORAL | Status: AC
  Filled 2018-03-13: qty 1

## 2018-03-13 MED ORDER — FENTANYL CITRATE (PF) 100 MCG/2ML IJ SOLN
25.0000 ug | INTRAMUSCULAR | Status: DC | PRN
  Administered 2018-03-13: 50 ug via INTRAVENOUS

## 2018-03-13 MED ORDER — LIDOCAINE HCL (CARDIAC) PF 100 MG/5ML IV SOSY
PREFILLED_SYRINGE | INTRAVENOUS | Status: DC | PRN
  Administered 2018-03-13: 100 mg via INTRAVENOUS

## 2018-03-13 MED ORDER — CEFAZOLIN SODIUM-DEXTROSE 1-4 GM/50ML-% IV SOLN
INTRAVENOUS | Status: AC
  Filled 2018-03-13: qty 50

## 2018-03-13 MED ORDER — ONDANSETRON HCL 4 MG/2ML IJ SOLN
INTRAMUSCULAR | Status: DC | PRN
  Administered 2018-03-13: 4 mg via INTRAVENOUS

## 2018-03-13 MED ORDER — ROCURONIUM BROMIDE 100 MG/10ML IV SOLN
INTRAVENOUS | Status: DC | PRN
  Administered 2018-03-13: 50 mg via INTRAVENOUS

## 2018-03-13 MED ORDER — PROPOFOL 10 MG/ML IV BOLUS
INTRAVENOUS | Status: AC
  Filled 2018-03-13: qty 40

## 2018-03-13 MED ORDER — SUGAMMADEX SODIUM 500 MG/5ML IV SOLN
INTRAVENOUS | Status: DC | PRN
  Administered 2018-03-13: 500 mg via INTRAVENOUS

## 2018-03-13 MED ORDER — CELECOXIB 200 MG PO CAPS
200.0000 mg | ORAL_CAPSULE | ORAL | Status: AC
  Administered 2018-03-13: 200 mg via ORAL

## 2018-03-13 MED ORDER — GABAPENTIN 300 MG PO CAPS
ORAL_CAPSULE | ORAL | Status: AC
  Filled 2018-03-13: qty 1

## 2018-03-13 MED ORDER — BUPIVACAINE-EPINEPHRINE (PF) 0.25% -1:200000 IJ SOLN
INTRAMUSCULAR | Status: DC | PRN
  Administered 2018-03-13: 20 mL

## 2018-03-13 MED ORDER — MIDAZOLAM HCL 2 MG/2ML IJ SOLN
1.0000 mg | INTRAMUSCULAR | Status: DC | PRN
  Administered 2018-03-13: 2 mg via INTRAVENOUS

## 2018-03-13 MED ORDER — DEXAMETHASONE SODIUM PHOSPHATE 10 MG/ML IJ SOLN
INTRAMUSCULAR | Status: AC
  Filled 2018-03-13: qty 1

## 2018-03-13 MED ORDER — LACTATED RINGERS IV SOLN
INTRAVENOUS | Status: DC
  Administered 2018-03-13: 09:00:00 via INTRAVENOUS

## 2018-03-13 MED ORDER — OXYCODONE HCL 5 MG/5ML PO SOLN: 5.0000 mg | Freq: Once | ORAL | Status: AC | PRN

## 2018-03-13 MED ORDER — FENTANYL CITRATE (PF) 100 MCG/2ML IJ SOLN
50.0000 ug | INTRAMUSCULAR | Status: DC | PRN
  Administered 2018-03-13: 100 ug via INTRAVENOUS

## 2018-03-13 MED ORDER — PROPOFOL 10 MG/ML IV BOLUS
INTRAVENOUS | Status: DC | PRN
  Administered 2018-03-13: 300 mg via INTRAVENOUS

## 2018-03-13 MED ORDER — ONDANSETRON HCL 4 MG/2ML IJ SOLN: 4.0000 mg | Freq: Once | INTRAMUSCULAR | Status: DC | PRN

## 2018-03-13 MED ORDER — OXYCODONE HCL 5 MG PO TABS: 5.0000 mg | ORAL_TABLET | Freq: Four times a day (QID) | ORAL | 0 refills | Status: DC | PRN

## 2018-03-13 MED ORDER — CEFAZOLIN SODIUM-DEXTROSE 2-4 GM/100ML-% IV SOLN: 2.0000 g | INTRAVENOUS | Status: DC

## 2018-03-13 MED ORDER — DEXAMETHASONE SODIUM PHOSPHATE 4 MG/ML IJ SOLN
INTRAMUSCULAR | Status: DC | PRN
  Administered 2018-03-13: 10 mg via INTRAVENOUS

## 2018-03-13 MED ORDER — SUCCINYLCHOLINE CHLORIDE 20 MG/ML IJ SOLN
INTRAMUSCULAR | Status: DC | PRN
  Administered 2018-03-13: 100 mg via INTRAVENOUS

## 2018-03-13 MED ORDER — ROCURONIUM BROMIDE 50 MG/5ML IV SOSY
PREFILLED_SYRINGE | INTRAVENOUS | Status: AC
  Filled 2018-03-13: qty 5

## 2018-03-13 MED ORDER — GABAPENTIN 300 MG PO CAPS
300.0000 mg | ORAL_CAPSULE | ORAL | Status: AC
  Administered 2018-03-13: 300 mg via ORAL

## 2018-03-13 MED ORDER — CELECOXIB 200 MG PO CAPS
ORAL_CAPSULE | ORAL | Status: AC
  Filled 2018-03-13: qty 1

## 2018-03-13 MED ORDER — SCOPOLAMINE 1 MG/3DAYS TD PT72: 1.0000 | MEDICATED_PATCH | Freq: Once | TRANSDERMAL | Status: DC | PRN

## 2018-03-13 SURGICAL SUPPLY — 41 items
ADH SKN CLS APL DERMABOND .7 (GAUZE/BANDAGES/DRESSINGS) ×1
APPLIER CLIP 5 13 M/L LIGAMAX5 (MISCELLANEOUS) ×2
APR CLP MED LRG 5 ANG JAW (MISCELLANEOUS) ×1
BAG SPEC RTRVL LRG 6X4 10 (ENDOMECHANICALS) ×1
BLADE CLIPPER SURG (BLADE) IMPLANT
CHLORAPREP W/TINT 26ML (MISCELLANEOUS) ×2 IMPLANT
CLIP APPLIE 5 13 M/L LIGAMAX5 (MISCELLANEOUS) ×1 IMPLANT
COVER MAYO STAND STRL (DRAPES) IMPLANT
DECANTER SPIKE VIAL GLASS SM (MISCELLANEOUS) ×2 IMPLANT
DERMABOND ADVANCED (GAUZE/BANDAGES/DRESSINGS) ×1
DERMABOND ADVANCED .7 DNX12 (GAUZE/BANDAGES/DRESSINGS) ×1 IMPLANT
DRAPE C-ARM 42X72 X-RAY (DRAPES) IMPLANT
DRAPE LAPAROSCOPIC ABDOMINAL (DRAPES) ×2 IMPLANT
ELECT REM PT RETURN 9FT ADLT (ELECTROSURGICAL) ×2
ELECTRODE REM PT RTRN 9FT ADLT (ELECTROSURGICAL) ×1 IMPLANT
FILTER SMOKE EVAC LAPAROSHD (FILTER) IMPLANT
GLOVE BIO SURGEON STRL SZ 6.5 (GLOVE) ×1 IMPLANT
GLOVE BIOGEL PI IND STRL 7.0 (GLOVE) IMPLANT
GLOVE BIOGEL PI INDICATOR 7.0 (GLOVE) ×2
GLOVE ECLIPSE 6.5 STRL STRAW (GLOVE) ×2 IMPLANT
GLOVE SURG SIGNA 7.5 PF LTX (GLOVE) ×2 IMPLANT
GOWN STRL REUS W/ TWL LRG LVL3 (GOWN DISPOSABLE) ×2 IMPLANT
GOWN STRL REUS W/ TWL XL LVL3 (GOWN DISPOSABLE) ×1 IMPLANT
GOWN STRL REUS W/TWL LRG LVL3 (GOWN DISPOSABLE) ×4
GOWN STRL REUS W/TWL XL LVL3 (GOWN DISPOSABLE) ×2
NS IRRIG 1000ML POUR BTL (IV SOLUTION) ×2 IMPLANT
PACK BASIN DAY SURGERY FS (CUSTOM PROCEDURE TRAY) ×2 IMPLANT
POUCH SPECIMEN RETRIEVAL 10MM (ENDOMECHANICALS) ×2 IMPLANT
SCISSORS LAP 5X35 DISP (ENDOMECHANICALS) ×2 IMPLANT
SET CHOLANGIOGRAPH 5 50 .035 (SET/KITS/TRAYS/PACK) IMPLANT
SET IRRIG TUBING LAPAROSCOPIC (IRRIGATION / IRRIGATOR) ×2 IMPLANT
SLEEVE ENDOPATH XCEL 5M (ENDOMECHANICALS) ×4 IMPLANT
SLEEVE SCD COMPRESS KNEE MED (MISCELLANEOUS) ×2 IMPLANT
SPECIMEN JAR SMALL (MISCELLANEOUS) ×1 IMPLANT
SUT MON AB 4-0 PC3 18 (SUTURE) ×2 IMPLANT
TOWEL GREEN STERILE FF (TOWEL DISPOSABLE) ×2 IMPLANT
TRAY LAPAROSCOPIC (CUSTOM PROCEDURE TRAY) ×2 IMPLANT
TROCAR XCEL BLUNT TIP 100MML (ENDOMECHANICALS) ×2 IMPLANT
TROCAR XCEL NON-BLD 5MMX100MML (ENDOMECHANICALS) ×2 IMPLANT
TUBE CONNECTING 20X1/4 (TUBING) ×2 IMPLANT
TUBING INSUFFLATION (TUBING) ×2 IMPLANT

## 2018-03-13 NOTE — Interval H&P Note (Signed)
History and Physical Interval Note: no change in H and P  03/13/2018 7:07 AM  Deborah Little  has presented today for surgery, with the diagnosis of BILIARY DYSKINESIA  The various methods of treatment have been discussed with the patient and family. After consideration of risks, benefits and other options for treatment, the patient has consented to  Procedure(s): LAPAROSCOPIC CHOLECYSTECTOMY (N/A) as a surgical intervention .  The patient's history has been reviewed, patient examined, no change in status, stable for surgery.  I have reviewed the patient's chart and labs.  Questions were answered to the patient's satisfaction.     [name] A

## 2018-03-13 NOTE — Discharge Instructions (Signed)
Do not take oxycodone until 5pm today.  Post Anesthesia Home Care Instructions  Activity: Get plenty of rest for the remainder of the day. A responsible individual must stay with you for 24 hours following the procedure.  For the next 24 hours, DO NOT: -Drive a car -Advertising copywriter -Drink alcoholic beverages -Take any medication unless instructed by your physician -Make any legal decisions or sign important papers.  Meals: Start with liquid foods such as gelatin or soup. Progress to regular foods as tolerated. Avoid greasy, spicy, heavy foods. If nausea and/or vomiting occur, drink only clear liquids until the nausea and/or vomiting subsides. Call your physician if vomiting continues.  Special Instructions/Symptoms: Your throat may feel dry or sore from the anesthesia or the breathing tube placed in your throat during surgery. If this causes discomfort, gargle with warm salt water. The discomfort should disappear within 24 hours.  If you had a scopolamine patch placed behind your ear for the management of post- operative nausea and/or vomiting:  1. The medication in the patch is effective for 72 hours, after which it should be removed.  Wrap patch in a tissue and discard in the trash. Wash hands thoroughly with soap and water. 2. You may remove the patch earlier than 72 hours if you experience unpleasant side effects which may include dry mouth, dizziness or visual disturbances. 3. Avoid touching the patch. Wash your hands with soap and water after contact with the patch.   CCS ______[facility] LAPAROSCOPIC SURGERY: POST OP INSTRUCTIONS Always review your discharge instruction sheet given to you by the facility where your surgery was performed. IF YOU HAVE DISABILITY OR FAMILY LEAVE FORMS, YOU MUST BRING THEM TO THE OFFICE FOR PROCESSING.   DO NOT GIVE THEM TO YOUR DOCTOR.  1. A prescription for pain medication may be given to you upon discharge.  Take your pain  medication as prescribed, if needed.  If narcotic pain medicine is not needed, then you may take acetaminophen (Tylenol) or ibuprofen (Advil) as needed. 2. Take your usually prescribed medications unless otherwise directed. 3. If you need a refill on your pain medication, please contact your pharmacy.  They will contact our office to request authorization. Prescriptions will not be filled after 5pm or on week-ends. 4. You should follow a light diet the first few days after arrival home, such as soup and crackers, etc.  Be sure to include lots of fluids daily. 5. Most patients will experience some swelling and bruising in the area of the incisions.  Ice packs will help.  Swelling and bruising can take several days to resolve.  6. It is common to experience some constipation if taking pain medication after surgery.  Increasing fluid intake and taking a stool softener (such as Colace) will usually help or prevent this problem from occurring.  A mild laxative (Milk of Magnesia or Miralax) should be taken according to package instructions if there are no bowel movements after 48 hours. 7. Unless discharge instructions indicate otherwise, you may remove your bandages 24-48 hours after surgery, and you may shower at that time.  You may have steri-strips (small skin tapes) in place directly over the incision.  These strips should be left on the skin for 7-10 days.  If your surgeon used skin glue on the incision, you may shower in 24 hours.  The glue will flake off over the next 2-3 weeks.  Any sutures or staples will be removed at the office during your follow-up visit.  8. ACTIVITIES:  You may resume regular (light) daily activities beginning the next day--such as daily self-care, walking, climbing stairs--gradually increasing activities as tolerated.  You may have sexual intercourse when it is comfortable.  Refrain from any heavy lifting or straining until approved by your doctor. a. You may drive when you are no  longer taking prescription pain medication, you can comfortably wear a seatbelt, and you can safely maneuver your car and apply brakes. b. RETURN TO WORK:  _____1 TO 2 WEEKS_____________________________________________________ 9. You should see your doctor in the office for a follow-up appointment approximately 2-3 weeks after your surgery.  Make sure that you call for this appointment within a day or two after you arrive home to insure a convenient appointment time. 10. OTHER INSTRUCTIONS:OK TO SHOWER STARTING TOMORROW 11. ICE PACK, TYLENOL, IBUPROFEN ALSO FOR PAIN 12. NO LIFTING MORE THAN 15 TO 20 POUNDS FOR 2 WEEKS __________________________________________________________________________________________________________________________ __________________________________________________________________________________________________________________________ WHEN TO CALL YOUR DOCTOR: 1. Fever over 101.0 2. Inability to urinate 3. Continued bleeding from incision. 4. Increased pain, redness, or drainage from the incision. 5. Increasing abdominal pain  The clinic staff is available to answer your questions during regular business hours.  Please dont hesitate to call and ask to speak to one of the nurses for clinical concerns.  If you have a medical emergency, go to the nearest emergency room or call 911.  A surgeon from [facility] is always on call at the hospital. [address], [place], [place]  [address] ? [address] [address], [place], [place]   [address] [phone] ? [phone] ? FAX [phone] Web site: [email or web address]

## 2018-03-13 NOTE — Anesthesia Postprocedure Evaluation (Signed)
Anesthesia Post Note  Patient: Deborah Little  Procedure(s) Performed: LAPAROSCOPIC CHOLECYSTECTOMY (N/A Abdomen)     Patient location during evaluation: PACU Anesthesia Type: General Level of consciousness: awake and alert Pain management: pain level controlled Vital Signs Assessment: post-procedure vital signs reviewed and stable Respiratory status: spontaneous breathing, nonlabored ventilation and respiratory function stable Cardiovascular status: blood pressure returned to baseline and stable Postop Assessment: no apparent nausea or vomiting Anesthetic complications: no    Last Vitals:  Vitals:   03/13/18 1042 03/13/18 1055  BP:  130/90  Pulse: 85 83  Resp: 16 16  Temp:  36.9 C  SpO2: 99% 99%    Last Pain:  Vitals:   03/13/18 1055  TempSrc: Oral  PainSc: 6                  Lucretia Kern

## 2018-03-13 NOTE — Transfer of Care (Signed)
Immediate Anesthesia Transfer of Care Note  Patient: Deborah Little  Procedure(s) Performed: LAPAROSCOPIC CHOLECYSTECTOMY (N/A Abdomen)  Patient Location: PACU  Anesthesia Type:General  Level of Consciousness: awake, alert  and oriented  Airway & Oxygen Therapy: Patient Spontanous Breathing and Patient connected to face mask oxygen  Post-op Assessment: Report given to RN and Post -op Vital signs reviewed and stable  Post vital signs: Reviewed and stable  Last Vitals:  Vitals Value Taken Time  BP 139/80 03/13/2018 10:02 AM  Temp    Pulse 100 03/13/2018 10:04 AM  Resp 19 03/13/2018 10:04 AM  SpO2 100 % 03/13/2018 10:04 AM  Vitals shown include unvalidated device data.  Last Pain:  Vitals:   03/13/18 0720  TempSrc: Oral  PainSc: 0-No pain      Patients Stated Pain Goal: 4 (03/13/18 0720)  Complications: No apparent anesthesia complications

## 2018-03-13 NOTE — Anesthesia Preprocedure Evaluation (Signed)
Anesthesia Evaluation  Patient identified by MRN, date of birth, ID band Patient awake    Reviewed: Allergy & Precautions, H&P , NPO status , Patient's Chart, lab work & pertinent test results  History of Anesthesia Complications Negative for: history of anesthetic complications  Airway Mallampati: II  TM Distance: >3 FB Neck ROM: full    Dental no notable dental hx.    Pulmonary neg pulmonary ROS,    Pulmonary exam normal breath sounds clear to auscultation       Cardiovascular negative cardio ROS Normal cardiovascular exam Rhythm:regular Rate:Normal     Neuro/Psych negative neurological ROS  negative psych ROS   GI/Hepatic negative GI ROS, Neg liver ROS,   Endo/Other  negative endocrine ROS  Renal/GU negative Renal ROS  negative genitourinary   Musculoskeletal  (+) Arthritis ,   Abdominal (+) + obese,   Peds  Hematology negative hematology ROS (+)   Anesthesia Other Findings   Reproductive/Obstetrics negative OB ROS                             Anesthesia Physical Anesthesia Plan  ASA: III  Anesthesia Plan: General   Post-op Pain Management:    Induction: Intravenous  PONV Risk Score and Plan: 4 or greater and Ondansetron, Dexamethasone, Midazolam and Treatment may vary due to age or medical condition  Airway Management Planned: Oral ETT  Additional Equipment:   Intra-op Plan:   Post-operative Plan: Extubation in OR  Informed Consent: I have reviewed the patients History and Physical, chart, labs and discussed the procedure including the risks, benefits and alternatives for the proposed anesthesia with the patient or authorized representative who has indicated his/her understanding and acceptance.     Plan Discussed with:   Anesthesia Plan Comments:         Anesthesia Quick Evaluation

## 2018-03-13 NOTE — Anesthesia Procedure Notes (Signed)
Procedure Name: Intubation Performed by: Verita Lamb, CRNA Pre-anesthesia Checklist: Patient identified, Emergency Drugs available, Suction available, Patient being monitored and Timeout performed Patient Re-evaluated:Patient Re-evaluated prior to induction Oxygen Delivery Method: Circle system utilized Preoxygenation: Pre-oxygenation with 100% oxygen Induction Type: IV induction Ventilation: Mask ventilation without difficulty Laryngoscope Size: Mac and 4 Grade View: Grade I Tube type: Oral Tube size: 7.0 mm Number of attempts: 1 Airway Equipment and Method: Stylet Placement Confirmation: ETT inserted through vocal cords under direct vision,  positive ETCO2,  CO2 detector and breath sounds checked- equal and bilateral Secured at: 22 cm Tube secured with: Tape Dental Injury: Teeth and Oropharynx as per pre-operative assessment

## 2018-03-13 NOTE — Interval H&P Note (Signed)
History and Physical Interval Note:no change in H and P  03/13/2018 10:42 AM  Grisel S Spellman  has presented today for surgery, with the diagnosis of BILIARY DYSKINESIA  The various methods of treatment have been discussed with the patient and family. After consideration of risks, benefits and other options for treatment, the patient has consented to  Procedure(s): LAPAROSCOPIC CHOLECYSTECTOMY (N/A) as a surgical intervention .  The patient's history has been reviewed, patient examined, no change in status, stable for surgery.  I have reviewed the patient's chart and labs.  Questions were answered to the patient's satisfaction.     Aminata Buffalo A

## 2018-03-13 NOTE — Op Note (Signed)

## 2018-03-14 ENCOUNTER — Encounter (HOSPITAL_BASED_OUTPATIENT_CLINIC_OR_DEPARTMENT_OTHER): Payer: Self-pay | Admitting: Surgery

## 2018-03-18 ENCOUNTER — Other Ambulatory Visit (INDEPENDENT_AMBULATORY_CARE_PROVIDER_SITE_OTHER): Payer: Self-pay | Admitting: Orthopaedic Surgery

## 2018-03-18 ENCOUNTER — Other Ambulatory Visit: Payer: Self-pay | Admitting: Family Medicine

## 2018-03-18 DIAGNOSIS — L309 Dermatitis, unspecified: Secondary | ICD-10-CM

## 2018-03-19 ENCOUNTER — Other Ambulatory Visit (INDEPENDENT_AMBULATORY_CARE_PROVIDER_SITE_OTHER): Payer: Self-pay | Admitting: Orthopaedic Surgery

## 2018-03-19 NOTE — Telephone Encounter (Signed)
CALLED INTO PHARM  

## 2018-03-19 NOTE — Telephone Encounter (Signed)
CALLED TRAMADOL INTO PHARM

## 2018-04-01 ENCOUNTER — Telehealth: Payer: Self-pay | Admitting: Family Medicine

## 2018-04-01 NOTE — Telephone Encounter (Unsigned)
Copied from CRM [id number]. Topic: General - Other >> Apr 01, 2018  1:25 PM Marylen Ponto wrote: Reason for CRM: Pt requests a call back from Dr. Creta Levin' nurse. Cb# [phone]

## 2018-04-02 NOTE — Telephone Encounter (Signed)
Spoke with pt and she advises she called on yesterday re: spitting up blood but she is ok.  Advised I was not in office on yesterday and what can I do to help- pt reinterates all is taken care of and appreciates the call back. Dgaddy, CMA

## 2018-05-09 ENCOUNTER — Ambulatory Visit: Payer: 59 | Admitting: Nurse Practitioner

## 2018-06-13 IMAGING — NM NM HEPATO W/GB/PHARM/[PERSON_NAME]
3 series · 13 of 13 positions shown · non-contrast
Comparison: None.

CLINICAL DATA: Epigastric pain with nausea and vomiting

EXAM:
NUCLEAR MEDICINE HEPATOBILIARY IMAGING WITH GALLBLADDER EF
VIEWS:
Anterior, right lateral right upper quadrant
RADIOPHARMACEUTICALS:  5.3 mCi Wc-FFm  Choletec IV

[he hepatobiliary · 2.26mm/px · 1 of 1 slices shown (1 of 3)]
[im 1/1]
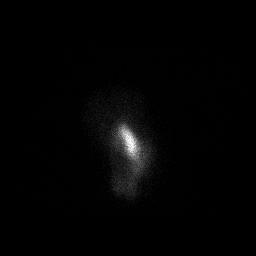

[he hepatobiliary · 3.10mm/px · 6 of 60 frames shown (2 of 3)]
[frame 6/60]
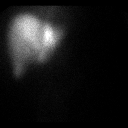
[frame 16/60]
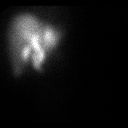
[frame 26/60]
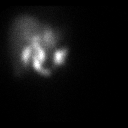
[frame 36/60]
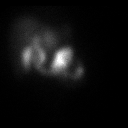
[frame 46/60]
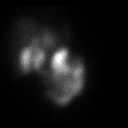
[frame 56/60]
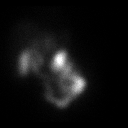

[he hepatobiliary · 3.10mm/px · 6 of 60 frames shown (3 of 3)]
[frame 6/60]
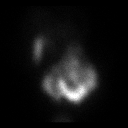
[frame 16/60]
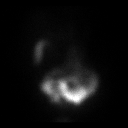
[frame 26/60]
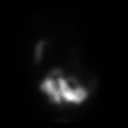
[frame 36/60]
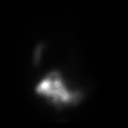
[frame 46/60]
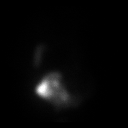
[frame 56/60]
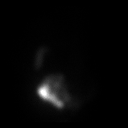

[13 of 13 positions shown; findings below may reference images not displayed]

FINDINGS: Radiotracer uptake in the liver is normal. There is prompt
visualization of gallbladder and small bowel, indicating patency of
the cystic and common bile ducts.

The patient consumed 8 ounces of Ensure orally with calculation of
the computer generated ejection fraction of radiotracer from the
gallbladder. No report of clinical symptoms with the oral Ensure
consumption. The computer generated ejection fraction of radiotracer
from the gallbladder is abnormally low at 25%, normal greater than
33% using the oral agent.
IMPRESSION: Abnormally low ejection fraction of radiotracer from the
gallbladder, a finding indicative of a degree of biliary dyskinesia.
Cystic and common bile ducts are patent as is evidenced by
visualization of gallbladder and small bowel.

## 2018-06-27 ENCOUNTER — Ambulatory Visit (INDEPENDENT_AMBULATORY_CARE_PROVIDER_SITE_OTHER): Payer: 59 | Admitting: Family Medicine

## 2018-06-27 ENCOUNTER — Other Ambulatory Visit (HOSPITAL_COMMUNITY)
Admission: RE | Admit: 2018-06-27 | Discharge: 2018-06-27 | Disposition: A | Discharge status: Home / Self Care | Payer: 59 | Source: Ambulatory Visit | Attending: Family Medicine | Admitting: Family Medicine

## 2018-06-27 ENCOUNTER — Encounter: Payer: Self-pay | Admitting: Family Medicine

## 2018-06-27 VITALS — BP 124/85 | HR 85 | Ht 68.0 in | Wt 301.0 lb

## 2018-06-27 DIAGNOSIS — N898 Other specified noninflammatory disorders of vagina: Secondary | ICD-10-CM | POA: Diagnosis present

## 2018-06-27 DIAGNOSIS — Z113 Encounter for screening for infections with a predominantly sexual mode of transmission: Secondary | ICD-10-CM | POA: Diagnosis present

## 2018-06-27 DIAGNOSIS — R2231 Localized swelling, mass and lump, right upper limb: Secondary | ICD-10-CM

## 2018-06-27 NOTE — Progress Notes (Signed)
   Subjective:    Deborah Little - 33 y.o. female MRN [id number]  Date of birth: 04/15/1986  HPI  Deborah Little is a 33 y.o. G87P1001 female here for lump under arm and STD testing. Lump just started a few days ago. Pt is concerned about breast cancer. Lump is not tender, red or draining anything. Does not have a history of similar lesions. No family history of breast cancer in 1st degree relatives. Had a great aunt with breast cancer. No regular self breast exams. Also reports that partner has been cheating with multiple other women. Would like full set of STD testing today.  Asymptomatic other than mild discharge.     OB History    Gravida  1   Para  1   Term  1   Preterm  0   AB  0   Living  1     SAB  0   TAB  0   Ectopic  0   Multiple  0   Live Births  1             Health Maintenance:  Normal pap on 10/17/2016.  No prior mammogram.    Health Maintenance Due  Topic Date Due  . PNEUMOCOCCAL POLYSACCHARIDE VACCINE AGE 49-64 HIGH RISK  09/23/1989  . FOOT EXAM  09/23/1997  . OPHTHALMOLOGY EXAM  09/23/1997  . URINE MICROALBUMIN  09/23/1997  . HEMOGLOBIN A1C  05/24/2017  . INFLUENZA VACCINE  01/17/2018    -  reports that she has never smoked. She has never used smokeless tobacco. - Review of Systems: Per HPI. - Past Medical History: Patient Active Problem List   Diagnosis Date Noted  . Morbid obesity with BMI of 40.0-44.9, adult (HCC) 02/26/2018  . Primary osteoarthritis, right hand 06/08/2016   - Medications: reviewed and updated   Objective:   Physical Exam BP 124/85   Pulse 85   Ht 5\' 8"  (1.727 m)   Wt (!) 301 lb (136.5 kg)   LMP 06/16/2018   BMI 45.77 kg/m  Gen: NAD, alert, cooperative with exam, well-appearing HEENT: NCAT, PERRL, clear conjunctiva, oropharynx clear, supple neck CV: RRR, good S1/S2, no murmur, no edema, capillary refill brisk  Resp: CTABL, no wheezes, non-labored Abd: SNTND, BS present, no guarding or organomegaly Skin: no  rashes, normal turgor  Neuro: no gross deficits.  Psych: good insight, alert and oriented GU/GYN: Exam performed in the presence of a chaperone. External genitalia within normal limits.  Swab inserted for specimen collection without speculum placement.   Breast: bilateral breasts with normal inspection and palpation R Axila: 10 o 'clock with 28x33mm indurated area, mildly tender to palpation. Normal overlying skin.      Assessment & Plan:   1. Vaginal discharge - Cervicovaginal ancillary only( [place])  2. Routine screening for STI (sexually transmitted infection) - Cervicovaginal ancillary only( [place]) - RPR - HIV antibody (with reflex) - Hepatitis B Surface AntiGEN - Hepatitis C Antibody  3. Subcutaneous nodule of right upper extremity - warm compresses - return precautions discussed - follow up PRN    Routine preventative health maintenance measures emphasized. Please refer to After Visit Summary for other counseling recommendations.   No follow-ups on file.  Gwenevere Abbot, MD  OB Fellow  06/27/2018, 6:53 PM

## 2018-06-28 LAB — RPR: RPR Ser Ql: NONREACTIVE

## 2018-06-28 LAB — HIV ANTIBODY (ROUTINE TESTING W REFLEX): HIV Screen 4th Generation wRfx: NONREACTIVE

## 2018-06-28 LAB — HEPATITIS B SURFACE ANTIGEN: HEP B S AG: NEGATIVE

## 2018-06-28 LAB — HEPATITIS C ANTIBODY

## 2018-07-01 ENCOUNTER — Other Ambulatory Visit: Payer: Self-pay

## 2018-07-01 ENCOUNTER — Telehealth: Payer: Self-pay | Admitting: Family Medicine

## 2018-07-01 ENCOUNTER — Telehealth (INDEPENDENT_AMBULATORY_CARE_PROVIDER_SITE_OTHER): Payer: Self-pay | Admitting: Family Medicine

## 2018-07-01 DIAGNOSIS — N898 Other specified noninflammatory disorders of vagina: Secondary | ICD-10-CM

## 2018-07-01 LAB — CERVICOVAGINAL ANCILLARY ONLY
Bacterial vaginitis: NEGATIVE
Candida vaginitis: POSITIVE — AB
Chlamydia: NEGATIVE
Neisseria Gonorrhea: NEGATIVE
Trichomonas: NEGATIVE

## 2018-07-01 MED ORDER — FLUCONAZOLE 150 MG PO TABS: 150.0000 mg | ORAL_TABLET | Freq: Once | ORAL | 0 refills | Status: AC

## 2018-07-01 NOTE — Telephone Encounter (Signed)
I called and advised the patient the form is not completed yet, but I will call her back once it has been completed and faxed.

## 2018-07-01 NOTE — Telephone Encounter (Signed)
Patient called asked if the form that she dropped off on 06/26/2018 was completed and faxed to her employer  (HR Division) Patient said she also needed a copy of the form. The number to contact patient is [phone]

## 2018-07-01 NOTE — Telephone Encounter (Signed)
Pt Called , Sent in Diflucan to pharmacy on file, Pt verified and had no questions.

## 2018-07-01 NOTE — Telephone Encounter (Signed)
Pt called in asking if she could have a prescription for yeast infection. She was just seen on Thursday and saw that her lab results are back. She is for sure she has one and would like to get the pill. She would like this done urgently please.

## 2018-07-02 NOTE — Telephone Encounter (Signed)
The form has been filled out by Dr. Prince Rome and faxed - patient advised. Left the original at the front desk for pickup.

## 2018-07-03 ENCOUNTER — Other Ambulatory Visit: Payer: Self-pay | Admitting: General Practice

## 2018-07-03 DIAGNOSIS — B379 Candidiasis, unspecified: Secondary | ICD-10-CM

## 2018-07-03 MED ORDER — FLUCONAZOLE 150 MG PO TABS: 150.0000 mg | ORAL_TABLET | Freq: Once | ORAL | 0 refills | Status: AC

## 2018-08-08 ENCOUNTER — Ambulatory Visit (INDEPENDENT_AMBULATORY_CARE_PROVIDER_SITE_OTHER): Payer: 59 | Admitting: Family Medicine

## 2018-08-08 ENCOUNTER — Encounter (INDEPENDENT_AMBULATORY_CARE_PROVIDER_SITE_OTHER): Payer: Self-pay | Admitting: Family Medicine

## 2018-08-08 DIAGNOSIS — R7982 Elevated C-reactive protein (CRP): Secondary | ICD-10-CM | POA: Diagnosis not present

## 2018-08-08 DIAGNOSIS — R768 Other specified abnormal immunological findings in serum: Secondary | ICD-10-CM

## 2018-08-08 DIAGNOSIS — E559 Vitamin D deficiency, unspecified: Secondary | ICD-10-CM | POA: Diagnosis not present

## 2018-08-08 DIAGNOSIS — M79641 Pain in right hand: Secondary | ICD-10-CM

## 2018-08-08 DIAGNOSIS — M2550 Pain in unspecified joint: Secondary | ICD-10-CM

## 2018-08-08 MED ORDER — ETODOLAC 400 MG PO TABS: 400.0000 mg | ORAL_TABLET | Freq: Two times a day (BID) | ORAL | 3 refills | Status: DC | PRN

## 2018-08-08 NOTE — Progress Notes (Signed)
   Office Visit Note   Patient: Deborah Little           Date of Birth: [birth year]           MRN: [id number] Visit Date: 08/08/2018 Requested by: Ronnald Nian, MD [address] [place], [place] [address] PCP: Ronnald Nian, MD  Subjective: Chief Complaint  Patient presents with  . Right Hand - Pain    Stiffness in hand off & on. Could not afford to see the rheumatologist ($150).    HPI: She is here for follow-up multiple joint pain, particularly her right hand and wrist.  In September labs were notable for vitamin D deficiency, elevated C-reactive protein but this time ANA was negative.  Lodine has helped, but only partially.  She is frustrated by her ongoing symptoms.  Now she feels like her eyes are tired, her feet and toes hurt, the back of her hips hurt.  She is very frustrated by her ongoing symptoms.  She would like to see a rheumatologist but her previous one was out of network.              ROS: Otherwise noncontributory  Objective: Vital Signs: There were no vitals taken for this visit.  Physical Exam:  Right hand: Negative Tinel's carpal tunnel, negative Phalen's.  No swelling, no warmth or erythema.  Good range of motion of her hand and wrist today.  Imaging: None today.  Assessment & Plan: 1.  Multiple joint pain with vitamin D deficiency, elevated C-reactive protein, history of mildly elevated ANA. -Recheck vitamin D and CRP today.  Refilled Lodine to take as needed.  Referral to in network rheumatologist.     Procedures: No procedures performed  No notes on file     PMFS History: Patient Active Problem List   Diagnosis Date Noted  . Morbid obesity with BMI of 40.0-44.9, adult (HCC) 02/26/2018  . Primary osteoarthritis, right hand 06/08/2016   Past Medical History:  Diagnosis Date  . Biliary dyskinesia 02/2018  . Obesity   . Osteoarthritis     Family History  Problem Relation Age of Onset  . Swallowing difficulties Brother   .  Hypertension Father   . Stroke Maternal Grandmother     Past Surgical History:  Procedure Laterality Date  . CHOLECYSTECTOMY N/A 03/13/2018   Procedure: LAPAROSCOPIC CHOLECYSTECTOMY;  Surgeon: Abigail Miyamoto, MD;  Location: [facility];  Service: General;  Laterality: N/A;  . WISDOM TOOTH EXTRACTION     Social History   Occupational History  . Not on file  Tobacco Use  . Smoking status: Never Smoker  . Smokeless tobacco: Never Used  Substance and Sexual Activity  . Alcohol use: Not Currently  . Drug use: No  . Sexual activity: Yes    Birth control/protection: I.U.D.

## 2018-08-09 ENCOUNTER — Telehealth (INDEPENDENT_AMBULATORY_CARE_PROVIDER_SITE_OTHER): Payer: Self-pay | Admitting: Family Medicine

## 2018-08-09 LAB — C-REACTIVE PROTEIN: CRP: 6.7 mg/L (ref <8.0)

## 2018-08-09 LAB — VITAMIN D 25 HYDROXY (VIT D DEFICIENCY, FRACTURES): Vit D, 25-Hydroxy: 33 ng/mL (ref 30–100)

## 2018-08-09 NOTE — Telephone Encounter (Signed)
Vitamin D is better, still not quite at target.

## 2018-09-11 NOTE — Progress Notes (Deleted)
Office Visit Note  Patient: Deborah Little             Date of Birth: 02-11-1986           MRN: [id number]             PCP: Denita Lung, MD Referring: Eunice Blase, MD Visit Date: 09/12/2018 Occupation: _0 @  Subjective:  No chief complaint on file.   History of Present Illness: Deborah Little is a 33 y.o. female ***   Activities of Daily Living:  Patient reports morning stiffness for *** {minute/hour:19697}.   Patient {ACTIONS;DENIES/REPORTS:21021675::"Denies"} nocturnal pain.  Difficulty dressing/grooming: {ACTIONS;DENIES/REPORTS:21021675::"Denies"} Difficulty climbing stairs: {ACTIONS;DENIES/REPORTS:21021675::"Denies"} Difficulty getting out of chair: {ACTIONS;DENIES/REPORTS:21021675::"Denies"} Difficulty using hands for taps, buttons, cutlery, and/or writing: {ACTIONS;DENIES/REPORTS:21021675::"Denies"}  No Rheumatology ROS completed.   PMFS History:  Patient Active Problem List   Diagnosis Date Noted  . Morbid obesity with BMI of 40.0-44.9, adult ([place]) 02/26/2018  . Primary osteoarthritis, right hand 06/08/2016    Past Medical History:  Diagnosis Date  . Biliary dyskinesia 02/2018  . Obesity   . Osteoarthritis     Family History  Problem Relation Age of Onset  . Swallowing difficulties Brother   . Hypertension Father   . Stroke Maternal Grandmother    Past Surgical History:  Procedure Laterality Date  . CHOLECYSTECTOMY N/A 03/13/2018   Procedure: LAPAROSCOPIC CHOLECYSTECTOMY;  Surgeon: Coralie Keens, MD;  Location: [facility];  Service: General;  Laterality: N/A;  . WISDOM TOOTH EXTRACTION     Social History   Social History Narrative  . Not on file   Immunization History  Administered Date(s) Administered  . Tdap 05/19/2011     Objective: Vital Signs: There were no vitals taken for this visit.   Physical Exam   Musculoskeletal Exam: ***  CDAI Exam: CDAI Score: Not documented Patient Global Assessment: Not  documented; Provider Global Assessment: Not documented Swollen: Not documented; Tender: Not documented Joint Exam   Not documented   There is currently no information documented on the homunculus. Go to the Rheumatology activity and complete the homunculus joint exam.  Investigation: Findings:  02/26/18: ANA-, CRP 6.8, CCP <16, RF<14, vitamin D 9, T3 29, T4 6.6, Free thyroxine 1.9, TSH 0.93  Component     Latest Ref Rng & Units 06/27/2018 08/08/2018  RPR     Non Reactive Non Reactive   HIV Screen 4th Generation wRfx     Non Reactive Non Reactive   Hepatitis B Surface Ag     Negative Negative   Hep C Virus Ab     0.0 - 0.9 s/co ratio <0.1   Vitamin D, 25-Hydroxy     30 - 100 ng/mL  33  CRP     <8.0 mg/L  6.7   Component     Latest Ref Rng & Units 02/26/2018  T3 Uptake     22 - 35 % 29  Thyroxine (T4)     5.1 - 11.9 mcg/dL 6.6  Free Thyroxine Index     1.4 - 3.8 1.9  TSH     mIU/L 0.93  Anti Nuclear Antibody (ANA)     NEGATIVE NEGATIVE  hs-CRP     mg/L 6.8 (H)  Cyclic Citrullin Peptide Ab     UNITS <16  Vitamin D, 25-Hydroxy     30 - 100 ng/mL 9 (L)  Thyroperoxidase Ab SerPl-aCnc     <9 IU/mL 2  RA Latex Turbid.     <14 IU/mL <14  Imaging: No results found.  Recent Labs: Lab Results  Component Value Date   WBC 6.6 02/26/2018   HGB 12.3 02/26/2018   PLT 307 02/26/2018   NA 138 02/26/2018   K 4.4 02/26/2018   CL 105 02/26/2018   CO2 27 02/26/2018   GLUCOSE 91 02/26/2018   BUN 8 02/26/2018   CREATININE 0.85 02/26/2018   BILITOT 0.4 02/26/2018   ALKPHOS 50 02/01/2016   AST 19 02/26/2018   ALT 17 02/26/2018   PROT 7.1 02/26/2018   ALBUMIN 4.4 02/01/2016   CALCIUM 9.0 02/26/2018   GFRAA  10/12/2010    >60        The eGFR has been calculated using the MDRD equation. This calculation has not been validated in all clinical situations. eGFR's persistently <60 mL/min signify possible Chronic Kidney Disease.    Speciality Comments: No specialty  comments available.  Procedures:  No procedures performed Allergies: Fish allergy; Hydrocodone; and Penicillins   Assessment / Plan:     Visit Diagnoses: Polyarthralgia  Positive ANA (antinuclear antibody)  Elevated C-reactive protein (CRP)  Vitamin D deficiency  History of obesity   Orders: No orders of the defined types were placed in this encounter.  No orders of the defined types were placed in this encounter.   Face-to-face time spent with patient was *** minutes. Greater than 50% of time was spent in counseling and coordination of care.  Follow-Up Instructions: No follow-ups on file.   Taylor M Dale, PA-C  Note - This record has been created using Dragon software.  Chart creation errors have been sought, but may not always  have been located. Such creation errors do not reflect on  the standard of medical care. 

## 2018-09-12 ENCOUNTER — Ambulatory Visit: Payer: 59 | Admitting: Rheumatology

## 2018-09-17 ENCOUNTER — Ambulatory Visit: Payer: 59 | Admitting: Rheumatology

## 2018-10-08 NOTE — Progress Notes (Deleted)
Virtual Visit via Video Note  I connected with Deborah Little on 10/08/18 at  9:45 AM EDT by a video enabled telemedicine application and verified that I am speaking with the correct person using two identifiers.   I discussed the limitations of evaluation and management by telemedicine and the availability of in person appointments. The patient expressed understanding and agreed to proceed.   CC:   History of Present Illness: Patent is a 33 year old female with a past medical history of    Review of Systems  Constitutional: Negative for fever and malaise/fatigue.  Eyes: Negative for photophobia, pain, discharge and redness.  Respiratory: Negative for cough, shortness of breath and wheezing.   Cardiovascular: Negative for chest pain and palpitations.  Gastrointestinal: Negative for blood in stool, constipation and diarrhea.  Genitourinary: Negative for dysuria.  Musculoskeletal: Negative for back pain, joint pain, myalgias and neck pain.  Skin: Negative for rash.  Neurological: Negative for dizziness and headaches.  Psychiatric/Behavioral: Negative for depression. The patient is not nervous/anxious and does not have insomnia.     Observations/Objective: Physical Exam  Constitutional: She is oriented to person, place, and time and well-developed, well-nourished, and in no distress.  HENT:  Head: Normocephalic and atraumatic.  Eyes: Conjunctivae are normal.  Pulmonary/Chest: Effort normal.  Neurological: She is alert and oriented to person, place, and time.  Psychiatric: Mood, memory, affect and judgment normal.    Patient reports morning stiffness for  {minute/hour:19697}.   Patient {Actions; denies-reports:120008} nocturnal pain.  Difficulty dressing/grooming: {ACTIONS;DENIES/REPORTS:21021675::"Denies"} Difficulty climbing stairs: {ACTIONS;DENIES/REPORTS:21021675::"Denies"} Difficulty getting out of chair: {ACTIONS;DENIES/REPORTS:21021675::"Denies"} Difficulty using hands for  taps, buttons, cutlery, and/or writing: {ACTIONS;DENIES/REPORTS:21021675::"Denies"}  Assessment and Plan:   Follow Up Instructions: She will follow up in    I discussed the assessment and treatment plan with the patient. The patient was provided an opportunity to ask questions and all were answered. The patient agreed with the plan and demonstrated an understanding of the instructions.   The patient was advised to call back or seek an in-person evaluation if the symptoms worsen or if the condition fails to improve as anticipated.  I provided *** minutes of non-face-to-face time during this encounter.   Gearldine Bienenstock, PA-C

## 2018-10-14 ENCOUNTER — Telehealth: Payer: 59 | Admitting: Rheumatology

## 2018-10-30 NOTE — Progress Notes (Deleted)
Office Visit Note  Patient: Deborah Little             Date of Birth: 1985-11-01           MRN: [id number]             PCP: Denita Lung, MD Referring: Eunice Blase, MD Visit Date: 11/07/2018 Occupation: @GUAROCC @  Subjective:  No chief complaint on file.   History of Present Illness: Deborah Little is a 33 y.o. female ***   Activities of Daily Living:  Patient reports morning stiffness for *** {minute/hour:19697}.   Patient {ACTIONS;DENIES/REPORTS:21021675::"Denies"} nocturnal pain.  Difficulty dressing/grooming: {ACTIONS;DENIES/REPORTS:21021675::"Denies"} Difficulty climbing stairs: {ACTIONS;DENIES/REPORTS:21021675::"Denies"} Difficulty getting out of chair: {ACTIONS;DENIES/REPORTS:21021675::"Denies"} Difficulty using hands for taps, buttons, cutlery, and/or writing: {ACTIONS;DENIES/REPORTS:21021675::"Denies"}  No Rheumatology ROS completed.   PMFS History:  Patient Active Problem List   Diagnosis Date Noted  . Morbid obesity with BMI of 40.0-44.9, adult ([place]) 02/26/2018  . Primary osteoarthritis, right hand 06/08/2016    Past Medical History:  Diagnosis Date  . Biliary dyskinesia 02/2018  . Obesity   . Osteoarthritis     Family History  Problem Relation Age of Onset  . Swallowing difficulties Brother   . Hypertension Father   . Stroke Maternal Grandmother    Past Surgical History:  Procedure Laterality Date  . CHOLECYSTECTOMY N/A 03/13/2018   Procedure: LAPAROSCOPIC CHOLECYSTECTOMY;  Surgeon: Coralie Keens, MD;  Location: [facility];  Service: General;  Laterality: N/A;  . WISDOM TOOTH EXTRACTION     Social History   Social History Narrative  . Not on file   Immunization History  Administered Date(s) Administered  . Tdap 05/19/2011     Objective: Vital Signs: There were no vitals taken for this visit.   Physical Exam   Musculoskeletal Exam: ***  CDAI Exam: CDAI Score: Not documented Patient Global Assessment: Not  documented; Provider Global Assessment: Not documented Swollen: Not documented; Tender: Not documented Joint Exam   Not documented   There is currently no information documented on the homunculus. Go to the Rheumatology activity and complete the homunculus joint exam.  Investigation: Findings:  02/26/18: RF <14, TSH 0.93, ANA- 06/27/18: Hep B-, Hep C-, HIV-, RPR- 08/08/18: CPR 6.7, vitamin D 33  Component     Latest Ref Rng & Units 06/27/2018 08/08/2018  RPR     Non Reactive Non Reactive   HIV Screen 4th Generation wRfx     Non Reactive Non Reactive   Hepatitis B Surface Ag     Negative Negative   Hep C Virus Ab     0.0 - 0.9 s/co ratio <0.1   Vitamin D, 25-Hydroxy     30 - 100 ng/mL  33  CRP     <8.0 mg/L  6.7   Component     Latest Ref Rng & Units 02/26/2018  T3 Uptake     22 - 35 % 29  Thyroxine (T4)     5.1 - 11.9 mcg/dL 6.6  Free Thyroxine Index     1.4 - 3.8 1.9  TSH     mIU/L 0.93  Thyroperoxidase Ab SerPl-aCnc     <9 IU/mL 2  RA Latex Turbid.     <14 IU/mL <14   Imaging: No results found.  Recent Labs: Lab Results  Component Value Date   WBC 6.6 02/26/2018   HGB 12.3 02/26/2018   PLT 307 02/26/2018   NA 138 02/26/2018   K 4.4 02/26/2018   CL 105 02/26/2018  CO2 27 02/26/2018   GLUCOSE 91 02/26/2018   BUN 8 02/26/2018   CREATININE 0.85 02/26/2018   BILITOT 0.4 02/26/2018   ALKPHOS 50 02/01/2016   AST 19 02/26/2018   ALT 17 02/26/2018   PROT 7.1 02/26/2018   ALBUMIN 4.4 02/01/2016   CALCIUM 9.0 02/26/2018   GFRAA  10/12/2010    >60        The eGFR has been calculated using the MDRD equation. This calculation has not been validated in all clinical situations. eGFR's persistently <60 mL/min signify possible Chronic Kidney Disease.    Speciality Comments: No specialty comments available.  Procedures:  No procedures performed Allergies: Fish allergy; Hydrocodone; and Penicillins   Assessment / Plan:     Visit Diagnoses: No diagnosis  found.   Orders: No orders of the defined types were placed in this encounter.  No orders of the defined types were placed in this encounter.   Face-to-face time spent with patient was *** minutes. Greater than 50% of time was spent in counseling and coordination of care.  Follow-Up Instructions: No follow-ups on file.   Ofilia Neas, PA-C  Note - This record has been created using Dragon software.  Chart creation errors have been sought, but may not always  have been located. Such creation errors do not reflect on  the standard of medical care.

## 2018-11-07 ENCOUNTER — Ambulatory Visit: Payer: 59 | Admitting: Rheumatology

## 2019-01-17 ENCOUNTER — Telehealth: Payer: Self-pay | Admitting: Family Medicine

## 2019-01-17 MED ORDER — TRAMADOL HCL 50 MG PO TABS: 50.0000 mg | ORAL_TABLET | Freq: Three times a day (TID) | ORAL | 0 refills | Status: DC | PRN

## 2019-01-17 NOTE — Telephone Encounter (Signed)
Please advise 

## 2019-01-17 NOTE — Telephone Encounter (Signed)
Sent!

## 2019-01-17 NOTE — Telephone Encounter (Signed)
Advised patient the refill was sent to [facility] on [address].

## 2019-01-17 NOTE — Telephone Encounter (Signed)
Patient called and requested refill of Tramadol.  Please call patient to [phone]

## 2019-01-24 ENCOUNTER — Telehealth: Payer: Self-pay | Admitting: Family Medicine

## 2019-01-24 NOTE — Telephone Encounter (Signed)
Pt called in said she had some questions about her chart/ diagnostics and would like a call back from terri or dr.hilts  [phone]

## 2019-01-27 NOTE — Telephone Encounter (Signed)
The patient would like to get a copy of the records to have for herself. She said she may need these to see a rheumatologist outside the [facility] system, if she cannot get in with Dr. Estanislado Pandy - see referral from March. She will give that office a call to check on an appointment. Please call patient at number in the original message. She would like the records ASAP (she actually requested today, if possible).

## 2019-01-28 NOTE — Telephone Encounter (Signed)
Pt to sign release form.

## 2019-02-19 NOTE — Progress Notes (Deleted)
Office Visit Note  Patient: Deborah Little             Date of Birth: [birth year]           MRN: [id number]             PCP: Denita Lung, MD Referring: Denita Lung, MD Visit Date: 03/05/2019 Occupation: @GUAROCC @  Subjective:  No chief complaint on file.   History of Present Illness: Deborah Little is a 33 y.o. female ***   Activities of Daily Living:  Patient reports morning stiffness for *** {minute/hour:19697}.   Patient {ACTIONS;DENIES/REPORTS:21021675::"Denies"} nocturnal pain.  Difficulty dressing/grooming: {ACTIONS;DENIES/REPORTS:21021675::"Denies"} Difficulty climbing stairs: {ACTIONS;DENIES/REPORTS:21021675::"Denies"} Difficulty getting out of chair: {ACTIONS;DENIES/REPORTS:21021675::"Denies"} Difficulty using hands for taps, buttons, cutlery, and/or writing: {ACTIONS;DENIES/REPORTS:21021675::"Denies"}  No Rheumatology ROS completed.   PMFS History:  Patient Active Problem List   Diagnosis Date Noted  . Morbid obesity with BMI of 40.0-44.9, adult ([place]) 02/26/2018  . Primary osteoarthritis, right hand 06/08/2016    Past Medical History:  Diagnosis Date  . Biliary dyskinesia 02/2018  . Obesity   . Osteoarthritis     Family History  Problem Relation Age of Onset  . Swallowing difficulties Brother   . Hypertension Father   . Stroke Maternal Grandmother    Past Surgical History:  Procedure Laterality Date  . CHOLECYSTECTOMY N/A 03/13/2018   Procedure: LAPAROSCOPIC CHOLECYSTECTOMY;  Surgeon: Coralie Keens, MD;  Location: [facility];  Service: General;  Laterality: N/A;  . WISDOM TOOTH EXTRACTION     Social History   Social History Narrative  . Not on file   Immunization History  Administered Date(s) Administered  . Tdap 05/19/2011     Objective: Vital Signs: There were no vitals taken for this visit.   Physical Exam   Musculoskeletal Exam: ***  CDAI Exam: CDAI Score: - Patient Global: -; Provider Global: - Swollen:  -; Tender: - Joint Exam   No joint exam has been documented for this visit   There is currently no information documented on the homunculus. Go to the Rheumatology activity and complete the homunculus joint exam.  Investigation: Findings:  06/27/18: RPR-, HIV-, Hep B-, Hep C-  08/08/18: Vitamin D 33, CRP 6.7 02/26/18: ANA-, CRP 6.8, CCP<16, RF<14, vitamin D 9, thyroperoxidase ab-, TSH 0.93, T4 6.6, T3 29  Component     Latest Ref Rng & Units 02/26/2018  T3 Uptake     22 - 35 % 29  Thyroxine (T4)     5.1 - 11.9 mcg/dL 6.6  Free Thyroxine Index     1.4 - 3.8 1.9  TSH     mIU/L 0.93  Anti Nuclear Antibody (ANA)     NEGATIVE NEGATIVE  hs-CRP     mg/L 6.8 (H)  Cyclic Citrullin Peptide Ab     UNITS <16  Vitamin D, 25-Hydroxy     30 - 100 ng/mL 9 (L)  Thyroperoxidase Ab SerPl-aCnc     <9 IU/mL 2  RA Latex Turbid.     <14 IU/mL <14   Component     Latest Ref Rng & Units 06/27/2018 08/08/2018  RPR     Non Reactive Non Reactive   HIV Screen 4th Generation wRfx     Non Reactive Non Reactive   Hepatitis B Surface Ag     Negative Negative   Hep C Virus Ab     0.0 - 0.9 s/co ratio <0.1   Vitamin D, 25-Hydroxy     30 - 100  ng/mL  33  CRP     <8.0 mg/L  6.7   Imaging: No results found.  Recent Labs: Lab Results  Component Value Date   WBC 6.6 02/26/2018   HGB 12.3 02/26/2018   PLT 307 02/26/2018   NA 138 02/26/2018   K 4.4 02/26/2018   CL 105 02/26/2018   CO2 27 02/26/2018   GLUCOSE 91 02/26/2018   BUN 8 02/26/2018   CREATININE 0.85 02/26/2018   BILITOT 0.4 02/26/2018   ALKPHOS 50 02/01/2016   AST 19 02/26/2018   ALT 17 02/26/2018   PROT 7.1 02/26/2018   ALBUMIN 4.4 02/01/2016   CALCIUM 9.0 02/26/2018   GFRAA  10/12/2010    >60        The eGFR has been calculated using the MDRD equation. This calculation has not been validated in all clinical situations. eGFR's persistently <60 mL/min signify possible Chronic Kidney Disease.    Speciality Comments: No  specialty comments available.  Procedures:  No procedures performed Allergies: Fish allergy, Hydrocodone, and Penicillins   Assessment / Plan:     Visit Diagnoses: No diagnosis found.  Orders: No orders of the defined types were placed in this encounter.  No orders of the defined types were placed in this encounter.   Face-to-face time spent with patient was *** minutes. Greater than 50% of time was spent in counseling and coordination of care.  Follow-Up Instructions: No follow-ups on file.   Ofilia Neas, PA-C  Note - This record has been created using Dragon software.  Chart creation errors have been sought, but may not always  have been located. Such creation errors do not reflect on  the standard of medical care.

## 2019-03-05 ENCOUNTER — Ambulatory Visit: Payer: 59 | Admitting: Rheumatology

## 2019-03-24 ENCOUNTER — Telehealth: Payer: Self-pay | Admitting: Family Medicine

## 2019-03-24 NOTE — Telephone Encounter (Signed)
Patient called. She says that there will be FMLA papers faxed for Dr. Junius Roads to fill out. She was hoping you could put 2 days a week for her job. Her call back number is [phone]

## 2019-03-26 NOTE — Telephone Encounter (Signed)
I called and spoke with Deborah Little. Dr. Junius Roads said this would be fine. She asked that we fax the form back for her and then let her know when she can pick up the original form.

## 2019-03-28 ENCOUNTER — Telehealth: Payer: Self-pay | Admitting: Radiology

## 2019-03-28 NOTE — Telephone Encounter (Signed)
I faxed in FMLA forms and faxed a copy to the patient also @ [phone]

## 2019-03-28 NOTE — Telephone Encounter (Signed)
FLMA form faxed to employer, and patient asked for a copy to be faxed to her at work @ [phone].

## 2019-04-28 ENCOUNTER — Other Ambulatory Visit: Payer: Self-pay

## 2019-04-28 ENCOUNTER — Ambulatory Visit (INDEPENDENT_AMBULATORY_CARE_PROVIDER_SITE_OTHER): Payer: 59

## 2019-04-28 ENCOUNTER — Ambulatory Visit (HOSPITAL_COMMUNITY)
Admission: EM | Admit: 2019-04-28 | Discharge: 2019-04-28 | Disposition: A | Discharge status: Home / Self Care | Payer: 59 | Attending: Family Medicine | Admitting: Family Medicine

## 2019-04-28 ENCOUNTER — Encounter (HOSPITAL_COMMUNITY): Payer: Self-pay | Admitting: Emergency Medicine

## 2019-04-28 DIAGNOSIS — R079 Chest pain, unspecified: Secondary | ICD-10-CM | POA: Diagnosis present

## 2019-04-28 DIAGNOSIS — Z20828 Contact with and (suspected) exposure to other viral communicable diseases: Secondary | ICD-10-CM | POA: Diagnosis not present

## 2019-04-28 DIAGNOSIS — Z793 Long term (current) use of hormonal contraceptives: Secondary | ICD-10-CM | POA: Insufficient documentation

## 2019-04-28 DIAGNOSIS — Z79899 Other long term (current) drug therapy: Secondary | ICD-10-CM | POA: Insufficient documentation

## 2019-04-28 DIAGNOSIS — Z6841 Body Mass Index (BMI) 40.0 and over, adult: Secondary | ICD-10-CM | POA: Insufficient documentation

## 2019-04-28 MED ORDER — OMEPRAZOLE 40 MG PO CPDR: 40.0000 mg | DELAYED_RELEASE_CAPSULE | Freq: Every day | ORAL | 0 refills | Status: DC

## 2019-04-28 MED ORDER — ALUM & MAG HYDROXIDE-SIMETH 200-200-20 MG/5ML PO SUSP
ORAL | Status: AC
  Filled 2019-04-28: qty 30

## 2019-04-28 MED ORDER — LIDOCAINE VISCOUS HCL 2 % MT SOLN
15.0000 mL | Freq: Once | OROMUCOSAL | Status: AC
  Administered 2019-04-28: 15 mL via ORAL

## 2019-04-28 MED ORDER — ALUM & MAG HYDROXIDE-SIMETH 200-200-20 MG/5ML PO SUSP
30.0000 mL | Freq: Once | ORAL | Status: AC
  Administered 2019-04-28: 21:00:00 30 mL via ORAL

## 2019-04-28 MED ORDER — LIDOCAINE VISCOUS HCL 2 % MT SOLN
OROMUCOSAL | Status: AC
  Filled 2019-04-28: qty 15

## 2019-04-28 NOTE — ED Triage Notes (Signed)
Squeezing sensation in chest around 11:00 am today.  Now tightness in neck and tightness in back, discomfort.  No nausea, no vomiting.

## 2019-04-28 NOTE — ED Provider Notes (Signed)
[facility]    CSN: [id number] Arrival date & time: 04/28/19  1913      History   Chief Complaint Chief Complaint  Patient presents with  . Chest Pain    HPI Deborah Little is a 33 y.o. female.   Deborah Little presents with complaints of central chest squeezing sensation which started today at 1100 while she was at work. No specific known trigger. This then moved to the sensation within her throat. No diaphoresis, no nausea or vomiting, no arm or jaw pain. Originally she seemed to notice it some with breathing as well but no further, and doesn't have any specific shortness of breath. She feels like she notices her breathing. Feels like "something is there" to esophageal area. She hadn't eaten breakfast, and was able to eat chicken salad for lunch at 2 this afternoon as well as soup for dinner. Symptoms have not since worsened but haven't improved. No abdominal pain. No fevers. No URI symptoms. Denies foul taste in mouth. Denies any previous similar. She did take pepcid and has used some Tums-like chewable antacid which do seem to provide some temporary relief before sensation returns. She travelled to [place] recently but no other long travel or immobilization.doesnt smoke. No leg pain or swelling. No history of blood clots. Denies any early family history of heart problems. Denies any personal cardiac history. She has had her gallbladder removed. She is obese.     ROS per HPI, negative if not otherwise mentioned.      Past Medical History:  Diagnosis Date  . Biliary dyskinesia 02/2018  . Obesity   . Osteoarthritis     Patient Active Problem List   Diagnosis Date Noted  . Morbid obesity with BMI of 40.0-44.9, adult (HCC) 02/26/2018  . Primary osteoarthritis, right hand 06/08/2016    Past Surgical History:  Procedure Laterality Date  . CHOLECYSTECTOMY N/A 03/13/2018   Procedure: LAPAROSCOPIC CHOLECYSTECTOMY;  Surgeon: Abigail MiyamotoBlackman, Douglas, MD;  Location: [facility];  Service: General;  Laterality: N/A;  . WISDOM TOOTH EXTRACTION      OB History    Gravida  1   Para  1   Term  1   Preterm  0   AB  0   Living  1     SAB  0   TAB  0   Ectopic  0   Multiple  0   Live Births  1            Home Medications    Prior to Admission medications   Medication Sig Start Date End Date Taking? Authorizing Provider  fluticasone (FLONASE) 50 MCG/ACT nasal spray USE 2 SPRAY(S) IN EACH NOSTRIL ONCE DAILY 03/18/18  Yes [provider]  levocetirizine (XYZAL) 5 MG tablet Take 5 mg by mouth every evening. 03/18/18  Yes [provider]  traMADol (ULTRAM) 50 MG tablet Take 1 tablet (50 mg total) by mouth 3 (three) times daily as needed. 01/17/19  Yes Hilts, Casimiro NeedleMichael, MD  Vitamin D, Ergocalciferol, 2000 units CAPS Take 10,000 Units by mouth daily.   Yes [provider]  etodolac (LODINE) 400 MG tablet Take 1 tablet (400 mg total) by mouth 2 (two) times daily as needed. 08/08/18   Hilts, Casimiro NeedleMichael, MD  levonorgestrel (MIRENA) 20 MCG/24HR IUD 1 each by Intrauterine route continuous.    [provider]  omeprazole (PRILOSEC) 40 MG capsule Take 1 capsule (40 mg total) by mouth daily. 04/28/19   Burky,  Barron Alvine, NP  triamcinolone cream (KENALOG) 0.1 % Apply topically 2 (two) times daily. Patient taking differently: Apply 1 application topically 2 (two) times daily as needed (eczema).  10/02/17   Ronnald Nian, MD  VOLTAREN 1 % GEL APPLY 2 TO 4 GRAMS TO AFFECTED AREA TWICE DAILY 03/18/18   [provider]    Family History Family History  Problem Relation Age of Onset  . Swallowing difficulties Brother   . Hypertension Father   . Stroke Maternal Grandmother     Social History Social History   Tobacco Use  . Smoking status: Never Smoker  . Smokeless tobacco: Never Used  Substance Use Topics  . Alcohol use: Not Currently  . Drug use: No     Allergies   Fish allergy, Hydrocodone, and  Penicillins   Review of Systems Review of Systems   Physical Exam Triage Vital Signs ED Triage Vitals  Enc Vitals Group     BP 04/28/19 2015 114/69     Pulse Rate 04/28/19 2015 61     Resp 04/28/19 2015 18     Temp 04/28/19 2015 99.1 F (37.3 C)     Temp Source 04/28/19 2015 Oral     SpO2 04/28/19 2015 100 %     Weight --      Height --      Head Circumference --      Peak Flow --      Pain Score 04/28/19 2012 9     Pain Loc --      Pain Edu? --      Excl. in [place]? --    No data found.  Updated Vital Signs BP 114/69 (BP Location: Right Arm)   Pulse 61   Temp 99.1 F (37.3 C) (Oral)   Resp 18   LMP 04/16/2019   SpO2 100%   Visual Acuity Right Eye Distance:   Left Eye Distance:   Bilateral Distance:    Right Eye Near:   Left Eye Near:    Bilateral Near:     Physical Exam Constitutional:      General: She is not in acute distress.    Appearance: She is well-developed.  Cardiovascular:     Rate and Rhythm: Normal rate and regular rhythm.     Heart sounds: Normal heart sounds.  Pulmonary:     Effort: Pulmonary effort is normal.     Breath sounds: Normal breath sounds.  Chest:     Chest wall: No tenderness.  Abdominal:     Palpations: Abdomen is soft.     Tenderness: There is no abdominal tenderness.  Skin:    General: Skin is warm and dry.  Neurological:     Mental Status: She is alert and oriented to person, place, and time.    EKG:  NSR rate of 79 . Previous EKG was not available for review. No stwave changes as interpreted by me.    UC Treatments / Results  Labs (all labs ordered are listed, but only abnormal results are displayed) Labs Reviewed  SARS CORONAVIRUS 2 (TAT 6-24 HRS)    EKG   Radiology Dg Chest 2 View  Result Date: 04/28/2019 CLINICAL DATA:  Chest pain over the last 2 days. EXAM: CHEST - 2 VIEW COMPARISON:  None. FINDINGS: Heart size is normal. Mediastinal shadows are normal. The lungs are clear. No bronchial thickening. No  infiltrate, mass, effusion or collapse. Pulmonary vascularity is normal. No bony abnormality. IMPRESSION: Normal chest Electronically Signed  By: Nelson Chimes M.D.   On: 04/28/2019 21:08    Procedures Procedures (including critical care time)  Medications Ordered in UC Medications  alum & mag hydroxide-simeth (MAALOX/MYLANTA) 200-200-20 MG/5ML suspension 30 mL (30 mLs Oral Given 04/28/19 2047)    And  lidocaine (XYLOCAINE) 2 % viscous mouth solution 15 mL (15 mLs Oral Given 04/28/19 2047)  alum & mag hydroxide-simeth (MAALOX/MYLANTA) 200-200-20 MG/5ML suspension (has no administration in time range)  lidocaine (XYLOCAINE) 2 % viscous mouth solution (has no administration in time range)    Initial Impression / Assessment and Plan / UC Course  I have reviewed the triage vital signs and the nursing notes.  Pertinent labs & imaging results that were available during my care of the patient were reviewed by me and considered in my medical decision making (see chart for details).     Chest xray and ekg normal tonight. No ACS risk factors. Pain does mildly improve with antacids. Some improvement here tonight after gi cocktail. Obese and short travel recently, but no tachycardia, shortness of breath , or pain with breathing, no leg pain or swelling, no other indications of PE at this time, low suspicion. Discomfort now to throat and esophagus, concerning primarily for gerd. Treatment discussed and dietary management discussed. Return precautions provided. Patient verbalized understanding and agreeable to plan.  Ambulatory out of clinic without difficulty.  covid also collected and pending as she does work around others with new onset of chest pain.   Final Clinical Impressions(s) / UC Diagnoses   Final diagnoses:  Chest pain, unspecified type     Discharge Instructions     Your chest xray and ekg are reassuring here tonight.  Your symptoms do sound more consistent with reflux symptoms.  I  would recommend use of daily omeprazole. You may use tums as needed for breakthrough pain.  See diet recommendations.  Please continue to follow up with your primary care provider as needed for persistent or recurrent symptoms.  Any worsening of symptoms, shortness of breath , nausea, arm or jaw pain, or otherwise worsening please go to the Er.    ED Prescriptions    Medication Sig Dispense Auth. Provider   omeprazole (PRILOSEC) 40 MG capsule Take 1 capsule (40 mg total) by mouth daily. 30 capsule Zigmund Gottron, NP     PDMP not reviewed this encounter.   Zigmund Gottron, NP 04/29/19 [id number]

## 2019-04-28 NOTE — Discharge Instructions (Signed)
Your chest xray and ekg are reassuring here tonight.  Your symptoms do sound more consistent with reflux symptoms.  I would recommend use of daily omeprazole. You may use tums as needed for breakthrough pain.  See diet recommendations.  Please continue to follow up with your primary care provider as needed for persistent or recurrent symptoms.  Any worsening of symptoms, shortness of breath , nausea, arm or jaw pain, or otherwise worsening please go to the Er.

## 2019-04-29 ENCOUNTER — Other Ambulatory Visit (HOSPITAL_COMMUNITY): Payer: Self-pay | Admitting: Gastroenterology

## 2019-04-29 ENCOUNTER — Ambulatory Visit (HOSPITAL_COMMUNITY)
Admission: RE | Admit: 2019-04-29 | Discharge: 2019-04-29 | Disposition: A | Discharge status: Home / Self Care | Payer: 59 | Source: Ambulatory Visit | Attending: Gastroenterology | Admitting: Gastroenterology

## 2019-04-29 DIAGNOSIS — R1310 Dysphagia, unspecified: Secondary | ICD-10-CM | POA: Insufficient documentation

## 2019-04-30 LAB — NOVEL CORONAVIRUS, NAA (HOSP ORDER, SEND-OUT TO REF LAB; TAT 18-24 HRS): SARS-CoV-2, NAA: NOT DETECTED

## 2019-06-18 ENCOUNTER — Telehealth: Payer: Self-pay | Admitting: Family Medicine

## 2019-06-18 ENCOUNTER — Telehealth: Payer: Self-pay

## 2019-06-18 DIAGNOSIS — R768 Other specified abnormal immunological findings in serum: Secondary | ICD-10-CM

## 2019-06-18 DIAGNOSIS — R7982 Elevated C-reactive protein (CRP): Secondary | ICD-10-CM

## 2019-06-18 DIAGNOSIS — M2550 Pain in unspecified joint: Secondary | ICD-10-CM

## 2019-06-18 MED ORDER — TRAMADOL HCL 50 MG PO TABS: 50.0000 mg | ORAL_TABLET | Freq: Every evening | ORAL | 0 refills | Status: DC | PRN

## 2019-06-18 NOTE — Telephone Encounter (Signed)
Please advise. Dr. Estanislado Pandy will not see her, as she no-showed x 3. Please advise.

## 2019-06-18 NOTE — Telephone Encounter (Signed)
Narcotics are really not a good idea for long-term.  Will refill tramadol to use sparingly.

## 2019-06-18 NOTE — Telephone Encounter (Signed)
Patient called asked if Dr Junius Roads will refer her to a Rheumatologist? The number to contact patient is [phone]

## 2019-06-18 NOTE — Addendum Note (Signed)
Addended by: Hortencia Pilar on: 06/18/2019 05:13 PM   Modules accepted: Orders

## 2019-06-18 NOTE — Telephone Encounter (Signed)
Patient called triage phone asking for return call to discuss her current medication. She has been taking the ultram for pain but taking one does not seem to help. Last week when pain was severe she took 2 and it made her sick. She said her GI doctor d/c'd meloxicam. Please call her to discuss [phone] [facility].

## 2019-06-18 NOTE — Telephone Encounter (Signed)
Referral made to outside rheumatologist.

## 2019-06-18 NOTE — Telephone Encounter (Signed)
I advised the patient a referral has been placed to a rheumatologist (outside of [facility]). That office will call her directly to set up the appointment.

## 2019-06-18 NOTE — Telephone Encounter (Signed)
The patient was recently taken off of her meloxicam (which worked well for her aches/pains) and all other NSAIDS by Dr. Collene Mares (GI) due to a finding of stomach ulcers x 2. The Tramadol does not work as well as it once did, but it does give some relief. Tylenol will work on her "better days" only. Voltaren gel does not do much. She took one of her sister's Hydrocodone 5/325 and it allowed her to rest at night. She asked if it was possible to get a small Rx of this. She plans to come in next week for possible injection(s) in her wrists (she could not come tomorrow, as it would be too short of notice to give her work). Also, the patient has not been to a rheumatologist yet - she said she still plans to make an appointment. If she cannot get the hydrocodone and there is not anything else to try, she asks if the tramadol can be refilled.

## 2019-06-18 NOTE — Telephone Encounter (Signed)
I advised the patient of this. She voiced understanding.

## 2019-06-26 ENCOUNTER — Telehealth: Payer: Self-pay | Admitting: Family Medicine

## 2019-06-26 DIAGNOSIS — M2550 Pain in unspecified joint: Secondary | ICD-10-CM

## 2019-06-26 DIAGNOSIS — R7982 Elevated C-reactive protein (CRP): Secondary | ICD-10-CM

## 2019-06-26 DIAGNOSIS — R768 Other specified abnormal immunological findings in serum: Secondary | ICD-10-CM

## 2019-06-26 NOTE — Telephone Encounter (Signed)
I called and advised the patient of the plan. [facility] will call her about scheduling the appointment.

## 2019-06-26 NOTE — Telephone Encounter (Signed)
Referral made to Dr. Dierdre Forth.

## 2019-06-26 NOTE — Telephone Encounter (Signed)
Please advise 

## 2019-06-26 NOTE — Telephone Encounter (Signed)
Patient called stated Hilts referred to [facility] and they are not taking New patients the month of January and possibly February. She wants tobe referred somewhere else.  Please call patient to advise.  [phone]

## 2019-06-30 ENCOUNTER — Ambulatory Visit (HOSPITAL_COMMUNITY)
Admission: EM | Admit: 2019-06-30 | Discharge: 2019-06-30 | Disposition: A | Discharge status: Home / Self Care | Payer: 59 | Attending: Family Medicine | Admitting: Family Medicine

## 2019-06-30 ENCOUNTER — Encounter (HOSPITAL_COMMUNITY): Payer: Self-pay

## 2019-06-30 ENCOUNTER — Other Ambulatory Visit: Payer: Self-pay

## 2019-06-30 DIAGNOSIS — U071 COVID-19: Secondary | ICD-10-CM | POA: Diagnosis not present

## 2019-06-30 DIAGNOSIS — Z20822 Contact with and (suspected) exposure to covid-19: Secondary | ICD-10-CM

## 2019-06-30 NOTE — Discharge Instructions (Signed)
°  May take Tylenol for pain or fever You may take over-the-counter cough and cold medicines as needed You must quarantine at home until your test result is available You can check for your test result in MyChart

## 2019-06-30 NOTE — ED Triage Notes (Signed)
Pt presents for covid testing after family exposure.

## 2019-06-30 NOTE — ED Provider Notes (Signed)
[facility]    CSN: [id number] Arrival date & time: 06/30/19  1946      History   Chief Complaint Chief Complaint  Patient presents with  . Covid Testing    HPI Deborah Little is a 34 y.o. female.   HPI  Exposed to Covid at a family gathering a week ago No symptoms  Past Medical History:  Diagnosis Date  . Biliary dyskinesia 02/2018  . Obesity   . Osteoarthritis     Patient Active Problem List   Diagnosis Date Noted  . Morbid obesity with BMI of 40.0-44.9, adult (HCC) 02/26/2018  . Primary osteoarthritis, right hand 06/08/2016    Past Surgical History:  Procedure Laterality Date  . CHOLECYSTECTOMY N/A 03/13/2018   Procedure: LAPAROSCOPIC CHOLECYSTECTOMY;  Surgeon: Abigail Miyamoto, MD;  Location: [facility];  Service: General;  Laterality: N/A;  . WISDOM TOOTH EXTRACTION      OB History    Gravida  1   Para  1   Term  1   Preterm  0   AB  0   Living  1     SAB  0   TAB  0   Ectopic  0   Multiple  0   Live Births  1            Home Medications    Prior to Admission medications   Medication Sig Start Date End Date Taking? Authorizing Provider  etodolac (LODINE) 400 MG tablet Take 1 tablet (400 mg total) by mouth 2 (two) times daily as needed. 08/08/18   Hilts, Casimiro Needle, MD  fluticasone (FLONASE) 50 MCG/ACT nasal spray USE 2 SPRAY(S) IN EACH NOSTRIL ONCE DAILY 03/18/18   [provider]  levocetirizine (XYZAL) 5 MG tablet Take 5 mg by mouth every evening. 03/18/18   [provider]  levonorgestrel (MIRENA) 20 MCG/24HR IUD 1 each by Intrauterine route continuous.    [provider]  omeprazole (PRILOSEC) 40 MG capsule Take 1 capsule (40 mg total) by mouth daily. 04/28/19   Linus Mako B, NP  triamcinolone cream (KENALOG) 0.1 % Apply topically 2 (two) times daily. Patient taking differently: Apply 1 application topically 2 (two) times daily as needed (eczema).  10/02/17   Ronnald Nian,  MD  Vitamin D, Ergocalciferol, 2000 units CAPS Take 10,000 Units by mouth daily.    [provider]  VOLTAREN 1 % GEL APPLY 2 TO 4 GRAMS TO AFFECTED AREA TWICE DAILY 03/18/18   [provider]    Family History Family History  Problem Relation Age of Onset  . Swallowing difficulties Brother   . Hypertension Father   . Stroke Maternal Grandmother     Social History Social History   Tobacco Use  . Smoking status: Never Smoker  . Smokeless tobacco: Never Used  Substance Use Topics  . Alcohol use: Not Currently  . Drug use: No     Allergies   Fish allergy, Hydrocodone, and Penicillins   Review of Systems Review of Systems  Constitutional: Negative for chills and fever.  HENT: Negative for congestion.   Respiratory: Negative for cough and shortness of breath.   Musculoskeletal: Negative for myalgias.  Neurological: Negative for headaches.   No Covid symptoms  Physical Exam Triage Vital Signs ED Triage Vitals [06/30/19 2019]  Enc Vitals Group     BP 122/75     Pulse Rate 81     Resp 20     Temp 99.1 F (  37.3 C)     Temp Source Oral     SpO2 100 %     Weight      Height      Head Circumference      Peak Flow      Pain Score 0     Pain Loc      Pain Edu?      Excl. in [place]?    No data found.  Updated Vital Signs BP 122/75 (BP Location: Left Arm)   Pulse 81   Temp 99.1 F (37.3 C) (Oral)   Resp 20   LMP 06/10/2019   SpO2 100%      Physical Exam Constitutional:      General: She is not in acute distress.    Appearance: She is well-developed. She is obese.  HENT:     Head: Normocephalic and atraumatic.  Eyes:     Conjunctiva/sclera: Conjunctivae normal.     Pupils: Pupils are equal, round, and reactive to light.  Cardiovascular:     Rate and Rhythm: Normal rate.  Pulmonary:     Effort: Pulmonary effort is normal. No respiratory distress.  Musculoskeletal:        General: Normal range of motion.     Cervical back: Normal range  of motion.  Skin:    General: Skin is warm and dry.  Neurological:     Mental Status: She is alert. Mental status is at baseline.     Gait: Gait normal.  Psychiatric:        Behavior: Behavior normal.     Comments: Pleasant      UC Treatments / Results  Labs (all labs ordered are listed, but only abnormal results are displayed) Labs Reviewed  NOVEL CORONAVIRUS, NAA (HOSP ORDER, SEND-OUT TO REF LAB; TAT 18-24 HRS)    EKG   Radiology No results found.  Procedures Procedures (including critical care time)  Medications Ordered in UC Medications - No data to display  Initial Impression / Assessment and Plan / UC Course  I have reviewed the triage vital signs and the nursing notes.  Pertinent labs & imaging results that were available during my care of the patient were reviewed by me and considered in my medical decision making (see chart for details).      Final Clinical Impressions(s) / UC Diagnoses   Final diagnoses:  Encounter for laboratory testing for COVID-19 virus     Discharge Instructions      May take Tylenol for pain or fever You may take over-the-counter cough and cold medicines as needed You must quarantine at home until your test result is available You can check for your test result in MyChart    ED Prescriptions    None     PDMP not reviewed this encounter.   Raylene Everts, MD 06/30/19 2032

## 2019-07-01 ENCOUNTER — Telehealth: Payer: Self-pay | Admitting: Family Medicine

## 2019-07-01 NOTE — Telephone Encounter (Signed)
I have uploaded labs as requested to [facility]

## 2019-07-01 NOTE — Telephone Encounter (Signed)
Victorino Dike form [facility] called.   They need the patient's lab results from September 2019 and February 2020  Call back: [phone]

## 2019-07-01 NOTE — Telephone Encounter (Signed)
Please send her most recent labs.

## 2019-07-03 ENCOUNTER — Telehealth (HOSPITAL_COMMUNITY): Payer: Self-pay | Admitting: Emergency Medicine

## 2019-07-03 LAB — NOVEL CORONAVIRUS, NAA (HOSP ORDER, SEND-OUT TO REF LAB; TAT 18-24 HRS): SARS-CoV-2, NAA: DETECTED — AB

## 2019-07-03 MED ORDER — BENZONATATE 100 MG PO CAPS: 100.0000 mg | ORAL_CAPSULE | Freq: Three times a day (TID) | ORAL | 0 refills | Status: DC

## 2019-07-03 NOTE — Telephone Encounter (Signed)
Pt requesting cough medicine. Okay to send tessalon per traci bast.

## 2019-07-03 NOTE — Telephone Encounter (Signed)
Your test for COVID-19 was positive, meaning that you were infected with the novel coronavirus and could give the germ to others.  Please continue isolation at home for at least 10 days since the start of your symptoms. If you do not have symptoms, please isolate at home for 10 days from the day you were tested. Once you complete your 10 day quarantine, you may return to normal activities as long as you've not had a fever for over 24 hours(without taking fever reducing medicine) and your symptoms are improving. Please continue good preventive care measures, including:  frequent hand-washing, avoid touching your face, cover coughs/sneezes, stay out of crowds and keep a 6 foot distance from others.  Go to the nearest hospital emergency room if fever/cough/breathlessness are severe or illness seems like a threat to life.  Patient contacted by phone and made aware of    results. Pt verbalized understanding and had all questions answered.  Quarantine ends Jan 22nd

## 2019-07-23 ENCOUNTER — Telehealth: Payer: Self-pay | Admitting: Family Medicine

## 2019-07-23 NOTE — Telephone Encounter (Signed)
I don't think I've seen them.

## 2019-07-23 NOTE — Telephone Encounter (Signed)
Have you seen any notes from [facility] yet?

## 2019-07-23 NOTE — Telephone Encounter (Signed)
Patient wanted to speak with you in regards to Hilts sending her to a Rheumatology and they found nothing wrong.  She wants to know what her notes said to Hilts.  Please call patient [phone]

## 2019-07-23 NOTE — Telephone Encounter (Signed)
I called the patient: We have not received any notes from the PA Azucena Fallen) that she saw on 07/10/19 at [facility] yet, other than a negative right wrist xray report. The patient says she was told she does not have RA nor Lupus, but she might have fibromyalgia. The patient wanted [place] to verify what she was told and get some guidance on what the next step will be. I advised her I will be on the lookout for these notes (I may need to call that office and have them sent over), have Dr. Prince Rome review it and then I will let her know what the next step of the plan is. Will hold this message for now, as reminder to check for notes.

## 2019-07-30 NOTE — Telephone Encounter (Signed)
Ann with [facility] will be faxing me the office notes today.

## 2019-07-31 NOTE — Telephone Encounter (Signed)
Please see my first note in this strand and advise (notes from [facility] on your desk).

## 2019-07-31 NOTE — Telephone Encounter (Signed)
I called to advise the patient of the email sent by Dr. Prince Rome to her - no answer and her voice mail box has not been set up. Will try again later.

## 2019-07-31 NOTE — Telephone Encounter (Signed)
Please tell her I sent an email to her (not a MyChart message) with information and answers to her question.

## 2019-08-01 NOTE — Telephone Encounter (Signed)
I called and advised the patient to check her email for Dr. Prince Rome' message - she said she will do so. She will let us know if she has any concerns/questions on this.

## 2019-09-17 ENCOUNTER — Telehealth: Payer: Self-pay | Admitting: Family Medicine

## 2019-09-17 NOTE — Telephone Encounter (Signed)
Patient called stated need a letter for Breast Reduction Surgery. She will you the details of what needs to go in the letter.  Please call [phone]

## 2019-09-18 NOTE — Telephone Encounter (Signed)
Yes, I can help with that.  But as you said, I'll need to see her for documentation.

## 2019-09-18 NOTE — Telephone Encounter (Signed)
The patient went for a consultation for breast reduction yesterday, due to shoulder and back issues she has been having. She would like to know if Dr. Prince Rome would be willing to provide a letter or whatever the surgeon needs, so that she can get this done. The patient says she was told the surgeon's office will be sending [place] something (possibly just a records release) and that the patient would need to let us know that this was coming. The patient says she does not really see Dr. Susann Givens anymore for primary care, so that is why she did not give the surgeon his name. I did let her know she may need to be evaluated here again specifically for the shoulder and back pain. She understands and is willing to do this, if needed.

## 2019-09-22 NOTE — Telephone Encounter (Signed)
I called and advised the patient. Appointment scheduled for 09/24/19 at 2:00 with Dr. Prince Rome for evaluation of back and shoulders.

## 2019-09-23 DIAGNOSIS — M2550 Pain in unspecified joint: Secondary | ICD-10-CM | POA: Insufficient documentation

## 2019-09-23 DIAGNOSIS — L309 Dermatitis, unspecified: Secondary | ICD-10-CM | POA: Insufficient documentation

## 2019-09-24 ENCOUNTER — Other Ambulatory Visit: Payer: Self-pay

## 2019-09-24 ENCOUNTER — Encounter: Payer: Self-pay | Admitting: Family Medicine

## 2019-09-24 ENCOUNTER — Ambulatory Visit (INDEPENDENT_AMBULATORY_CARE_PROVIDER_SITE_OTHER): Payer: 59 | Admitting: Family Medicine

## 2019-09-24 DIAGNOSIS — M25511 Pain in right shoulder: Secondary | ICD-10-CM

## 2019-09-24 DIAGNOSIS — M546 Pain in thoracic spine: Secondary | ICD-10-CM

## 2019-09-24 DIAGNOSIS — N62 Hypertrophy of breast: Secondary | ICD-10-CM

## 2019-09-24 DIAGNOSIS — M25512 Pain in left shoulder: Secondary | ICD-10-CM

## 2019-09-24 DIAGNOSIS — G8929 Other chronic pain: Secondary | ICD-10-CM

## 2019-09-24 NOTE — Progress Notes (Signed)
   Office Visit Note   Patient: Deborah Little           Date of Birth: August 31, 1985           MRN: [id number] Visit Date: 09/24/2019 Requested by: Denita Lung, MD [place],  [place] [address] PCP: Denita Lung, MD  Subjective: Chief Complaint  Patient presents with  . Middle Back - Pain    Pain and spasms in the middle of her back, around/between  the her shoulder blades. Dull, achy pain deep in both shoulders.  . Right Shoulder - Pain  . Left Shoulder - Pain    HPI: She is here with upper back and bilateral posterior shoulder pain.  She has been struggling with pain for more than 2 years, no injury.  She has tried home exercises, anti-inflammatories, and topical remedies.  Nothing seems to get rid of her pain completely.  She has met with a plastic surgeon to discuss the possibility of reduction mammoplasty and her surgeon feels that she is a good candidate for it.  Patient denies any radicular symptoms, weakness or numbness.  Pain is better when supine, worse when standing for long time.              ROS:   All other systems were reviewed and are negative.  Objective: Vital Signs: There were no vitals taken for this visit.  Physical Exam:  General:  Alert and oriented, in no acute distress. Pulm:  Breathing unlabored. Psy:  Normal mood, congruent affect.  Upper back: She has head forward posture with tightness and tenderness in the trapezius muscles and the paraspinous muscles of her upper thoracic spine.  Upper extremity strength and reflexes are normal. Chest:  She has large breasts.  Imaging: None today.  Chest x-ray from May 08, 2019 was reviewed today showing no significant thoracic degenerative changes.  Assessment & Plan: 1.  Chronic upper back and bilateral posterior shoulder pain, probably significantly exacerbated by large breast size. -I do think she would benefit from reduction mammoplasty.  I will write a letter in support of this.  She  will follow-up as needed.     Procedures: No procedures performed  No notes on file     PMFS History: Patient Active Problem List   Diagnosis Date Noted  . Arthralgia 09/23/2019  . Eczema 09/23/2019  . Morbid obesity with BMI of 40.0-44.9, adult ([facility]) 02/26/2018  . Primary osteoarthritis, right hand 06/08/2016   Past Medical History:  Diagnosis Date  . Biliary dyskinesia 02/2018  . Obesity   . Osteoarthritis     Family History  Problem Relation Age of Onset  . Swallowing difficulties Brother   . Hypertension Father   . Stroke Maternal Grandmother     Past Surgical History:  Procedure Laterality Date  . CHOLECYSTECTOMY N/A 03/13/2018   Procedure: LAPAROSCOPIC CHOLECYSTECTOMY;  Surgeon: Coralie Keens, MD;  Location: [facility];  Service: General;  Laterality: N/A;  . WISDOM TOOTH EXTRACTION     Social History   Occupational History  . Not on file  Tobacco Use  . Smoking status: Never Smoker  . Smokeless tobacco: Never Used  Substance and Sexual Activity  . Alcohol use: Not Currently  . Drug use: No  . Sexual activity: Yes    Birth control/protection: I.U.D.

## 2019-12-09 ENCOUNTER — Ambulatory Visit (INDEPENDENT_AMBULATORY_CARE_PROVIDER_SITE_OTHER): Payer: 59 | Admitting: Medical

## 2019-12-09 ENCOUNTER — Other Ambulatory Visit: Payer: Self-pay

## 2019-12-09 ENCOUNTER — Encounter: Payer: Self-pay | Admitting: Medical

## 2019-12-09 ENCOUNTER — Other Ambulatory Visit (HOSPITAL_COMMUNITY)
Admission: RE | Admit: 2019-12-09 | Discharge: 2019-12-09 | Disposition: A | Discharge status: Home / Self Care | Payer: 59 | Source: Ambulatory Visit | Attending: Medical | Admitting: Medical

## 2019-12-09 VITALS — BP 119/72 | HR 80 | Ht 67.0 in | Wt 306.6 lb

## 2019-12-09 DIAGNOSIS — Z113 Encounter for screening for infections with a predominantly sexual mode of transmission: Secondary | ICD-10-CM

## 2019-12-09 DIAGNOSIS — Z01419 Encounter for gynecological examination (general) (routine) without abnormal findings: Secondary | ICD-10-CM

## 2019-12-09 DIAGNOSIS — Z30431 Encounter for routine checking of intrauterine contraceptive device: Secondary | ICD-10-CM

## 2019-12-09 DIAGNOSIS — A599 Trichomoniasis, unspecified: Secondary | ICD-10-CM | POA: Diagnosis not present

## 2019-12-09 NOTE — Patient Instructions (Signed)

## 2019-12-10 LAB — HEPATITIS C ANTIBODY: Hep C Virus Ab: <0.1 s/co ratio (ref 0.0–0.9)

## 2019-12-10 LAB — HIV ANTIBODY (ROUTINE TESTING W REFLEX): HIV Screen 4th Generation wRfx: NONREACTIVE

## 2019-12-10 LAB — HEPATITIS B SURFACE ANTIGEN: Hepatitis B Surface Ag: NEGATIVE

## 2019-12-10 LAB — RPR: RPR Ser Ql: NONREACTIVE

## 2019-12-10 NOTE — Progress Notes (Signed)
History:  [name] is a 34 y.o. G1P1001 who presents to clinic today for annual exam with pap smear. The patient states that she is also concerned that her IUD is causing her recent issues with depression and anxiety. She does not have periods with the IUD. It is her second IUD. It was placed 10/17/2016 which was also the date of her last pap smear. That pap smear was normal and the patient denies a history of abnormal pap. She is sexually active currently.    The following portions of the patient's history were reviewed and updated as appropriate: allergies, current medications, family history, past medical history, social history, past surgical history and problem list.  Review of Systems:  Review of Systems  Constitutional: Negative for chills and fever.  Gastrointestinal: Negative for abdominal pain.  Genitourinary:       Neg - vaginal bleeding      Objective:  Physical Exam BP 119/72   Pulse 80   Ht 5\' 7"  (1.702 m)   Wt (!) 306 lb 9.6 oz (139.1 kg)   LMP 11/20/2019   BMI 48.02 kg/m  Physical Exam  Vitals reviewed. Constitutional: She is oriented to person, place, and time. No distress.  HENT:  Head: Normocephalic.  Eyes: Pupils are equal, round, and reactive to light.  Neck: No thyromegaly present.  Cardiovascular: Normal rate and regular rhythm.  No murmur heard. Respiratory: Effort normal and breath sounds normal. No respiratory distress. She has no wheezes.  GI: Soft. Bowel sounds are normal. She exhibits no distension. There is no abdominal tenderness. There is no guarding.  Genitourinary:    Vulva, right adnexa and left adnexa normal.  There is no rash or lesion on the right labia. There is no rash or lesion on the left labia. Uterus is not enlarged and not tender. Cervix exhibits no motion tenderness, no lesion, no discharge and no friability.    Vaginal discharge (small white, non-odorous) present.     No vaginal bleeding.  No bleeding in the vagina.    Musculoskeletal:     Cervical back: Normal range of motion and neck supple.  Neurological: She is alert and oriented to person, place, and time.  Psychiatric: Her behavior is normal. Mood normal.      Labs and Imaging Results for orders placed or performed in visit on 12/09/19 (from the past 24 hour(s))  HIV Antibody (routine testing w rflx)     Status: None   Collection Time: 12/09/19  4:53 PM  Result Value Ref Range   HIV Screen 4th Generation wRfx Non Reactive Non Reactive   Narrative   Performed at:  [facility] [address], [place], [place]  [id number] Lab Director: Rush Farmer MD, Phone:  [phone]  RPR     Status: None   Collection Time: 12/09/19  4:53 PM  Result Value Ref Range   RPR Ser Ql Non Reactive Non Reactive   Narrative   Performed at:  [facility] [address] [place], [place]  [id number] Lab Director: Rush Farmer MD, Phone:  [phone]  Hepatitis C Antibody     Status: None   Collection Time: 12/09/19  4:53 PM  Result Value Ref Range   Hep C Virus Ab <0.1 0.0 - 0.9 s/co ratio   Narrative   Performed at:  [facility] [address] [place], [place]  [id number] Lab Director: Rush Farmer MD, Phone:  [phone]  Hepatitis B Surface AntiGEN  Status: None   Collection Time: 12/09/19  4:53 PM  Result Value Ref Range   Hepatitis B Surface Ag Negative Negative   Narrative   Performed at:  [address] [facility] [address] [place], [place]  [id number] Lab Director: Jolene Schimke MD, Phone:  [phone]       Assessment & Plan:  1. Encounter for annual routine gynecological examination - Cytology - PAP( [facility])  2. Screen for STD (sexually transmitted disease) - HIV Antibody (routine testing w rflx) - RPR - Hepatitis C Antibody - Hepatitis B Surface AntiGEN - Pap smear includes GC/Chlamydia and trichomonas   3. IUD check up - IUD in place - Discussed other options for birth control and  unlikelihood that patients new onset of anxiety and depression are related to the IUD - Patient opts to keep IUD at this time and work with her [employer] to manage her symptoms   Patient will be contacted with results via MyChart Patient to return to [facility] in 1 year for annual exam or sooner PRN  Kathlene Cote 12/10/2019 11:26 AM

## 2019-12-12 ENCOUNTER — Telehealth: Payer: Self-pay

## 2019-12-12 LAB — CYTOLOGY - PAP
Chlamydia: NEGATIVE
Comment: NEGATIVE
Comment: NEGATIVE
Comment: NEGATIVE
Comment: NORMAL
Diagnosis: NEGATIVE
High risk HPV: NEGATIVE
Neisseria Gonorrhea: NEGATIVE
Trichomonas: POSITIVE — AB

## 2019-12-12 MED ORDER — METRONIDAZOLE 500 MG PO TABS: 2000.0000 mg | ORAL_TABLET | Freq: Once | ORAL | 0 refills | Status: AC

## 2019-12-12 NOTE — Telephone Encounter (Signed)
Called patient and advised of + Trich she verbalized understanding and I advised that Flagyl was sent in to the pharmacy on file and that it was important for her partner to be treated. She verbalized understanding and ended the call.

## 2019-12-12 NOTE — Addendum Note (Signed)
Addended by: Marny Lowenstein on: 12/12/2019 12:09 PM   Modules accepted: Orders

## 2019-12-12 NOTE — Telephone Encounter (Signed)
-----   Message from Marny Lowenstein, PA-C sent at 12/12/2019 12:09 PM EDT ----- + trichomonas Rx sent Please inform patient about results and Rx and need for partner testing  Marny Lowenstein, PA-C 12/12/2019 12:08 PM

## 2020-04-16 ENCOUNTER — Telehealth: Payer: Self-pay | Admitting: Family Medicine

## 2021-03-21 ENCOUNTER — Encounter (HOSPITAL_BASED_OUTPATIENT_CLINIC_OR_DEPARTMENT_OTHER): Payer: Self-pay | Admitting: Emergency Medicine

## 2021-03-21 ENCOUNTER — Other Ambulatory Visit: Payer: Self-pay

## 2021-03-21 ENCOUNTER — Emergency Department (HOSPITAL_BASED_OUTPATIENT_CLINIC_OR_DEPARTMENT_OTHER)
Admission: EM | Admit: 2021-03-21 | Discharge: 2021-03-21 | Disposition: A | Discharge status: Home / Self Care | Payer: 59 | Attending: Student | Admitting: Student

## 2021-03-21 ENCOUNTER — Emergency Department (HOSPITAL_BASED_OUTPATIENT_CLINIC_OR_DEPARTMENT_OTHER): Payer: 59

## 2021-03-21 DIAGNOSIS — R079 Chest pain, unspecified: Secondary | ICD-10-CM

## 2021-03-21 DIAGNOSIS — R0602 Shortness of breath: Secondary | ICD-10-CM | POA: Diagnosis not present

## 2021-03-21 DIAGNOSIS — M549 Dorsalgia, unspecified: Secondary | ICD-10-CM | POA: Insufficient documentation

## 2021-03-21 LAB — COMPREHENSIVE METABOLIC PANEL
ALT: 17 U/L (ref 0–44)
AST: 23 U/L (ref 15–41)
Albumin: 3.7 g/dL (ref 3.5–5.0)
Alkaline Phosphatase: 53 U/L (ref 38–126)
Anion gap: 5 (ref 5–15)
BUN: 10 mg/dL (ref 6–20)
CO2: 27 mmol/L (ref 22–32)
Calcium: 9 mg/dL (ref 8.9–10.3)
Chloride: 103 mmol/L (ref 98–111)
Creatinine, Ser: 0.83 mg/dL (ref 0.44–1.00)
GFR, Estimated: >60 mL/min (ref >60)
Glucose, Bld: 120 mg/dL — ABNORMAL HIGH (ref 70–99)
Potassium: 3.5 mmol/L (ref 3.5–5.1)
Sodium: 135 mmol/L (ref 135–145)
Total Bilirubin: 0.2 mg/dL — ABNORMAL LOW (ref 0.3–1.2)
Total Protein: 7.2 g/dL (ref 6.5–8.1)

## 2021-03-21 LAB — CBC
HCT: 35.8 % — ABNORMAL LOW (ref 36.0–46.0)
Hemoglobin: 11.5 g/dL — ABNORMAL LOW (ref 12.0–15.0)
MCH: 27.6 pg (ref 26.0–34.0)
MCHC: 32.1 g/dL (ref 30.0–36.0)
MCV: 86.1 fL (ref 80.0–100.0)
Platelets: 321 10*3/uL (ref 150–400)
RBC: 4.16 MIL/uL (ref 3.87–5.11)
RDW: 14.2 % (ref 11.5–15.5)
WBC: 8.6 10*3/uL (ref 4.0–10.5)
nRBC: 0 % (ref 0.0–0.2)

## 2021-03-21 LAB — LIPASE, BLOOD: Lipase: 27 U/L (ref 11–51)

## 2021-03-21 LAB — TROPONIN I (HIGH SENSITIVITY): Troponin I (High Sensitivity): 3 ng/L (ref <18)

## 2021-03-21 NOTE — ED Provider Notes (Signed)
[facility] EMERGENCY DEPARTMENT Provider Note   CSN: [id number] Arrival date & time: 03/21/21  9379     History Chief Complaint  Patient presents with   Chest Pain    Deborah Little is a 35 y.o. female.  Patient with no notable past medical history.  She presents the emergency department today with chest pain for 2 days.  She said it started in her back and extends through to her right side of her chest.  She describes the pain as being sharp.  She also feels like her heart is squeezing.  She tried aspirin and Gas-X with no improvement. Denies pleuritic pains. Denies pain with change in position. She has had some associated shortness of breath but this has not been constant.  She denies any nausea or vomiting.  She has no neurological symptoms.  She does not have her gallbladder. Denies fever and chills.   Chest Pain Associated symptoms: back pain and shortness of breath   Associated symptoms: no abdominal pain, no cough, no fever, no palpitations and no vomiting    HPI: A 35 year old patient with a history of obesity presents for evaluation of chest pain. Initial onset of pain was less than one hour ago. The patient's chest pain is sharp and is not worse with exertion. The patient's chest pain is not middle- or left-sided, is not well-localized, is not described as heaviness/pressure/tightness and does not radiate to the arms/jaw/neck. The patient does not complain of nausea and denies diaphoresis. The patient has no history of stroke, has no history of peripheral artery disease, has not smoked in the past 90 days, denies any history of treated diabetes, has no relevant family history of coronary artery disease (first degree relative at less than age 39), is not hypertensive and has no history of hypercholesterolemia.   Past Medical History:  Diagnosis Date   Biliary dyskinesia 02/2018   Obesity    Osteoarthritis     Patient Active Problem List   Diagnosis Date Noted    Arthralgia 09/23/2019   Eczema 09/23/2019   Morbid obesity with BMI of 40.0-44.9, adult ([facility]) 02/26/2018   Primary osteoarthritis, right hand 06/08/2016    Past Surgical History:  Procedure Laterality Date   CHOLECYSTECTOMY N/A 03/13/2018   Procedure: LAPAROSCOPIC CHOLECYSTECTOMY;  Surgeon: Abigail Miyamoto, MD;  Location: [facility];  Service: General;  Laterality: N/A;   WISDOM TOOTH EXTRACTION       OB History     Gravida  1   Para  1   Term  1   Preterm  0   AB  0   Living  1      SAB  0   IAB  0   Ectopic  0   Multiple  0   Live Births  1           Family History  Problem Relation Age of Onset   Swallowing difficulties Brother    Hypertension Father    Stroke Maternal Grandmother     Social History   Tobacco Use   Smoking status: Never   Smokeless tobacco: Never  Vaping Use   Vaping Use: Never used  Substance Use Topics   Alcohol use: Not Currently   Drug use: No    Home Medications Prior to Admission medications   Medication Sig Start Date End Date Taking? Authorizing Provider  levonorgestrel (MIRENA) 20 MCG/24HR IUD 1 each by Intrauterine route continuous.    [provider]  triamcinolone cream (KENALOG) 0.1 % Apply topically 2 (two) times daily. Patient taking differently: Apply 1 application topically 2 (two) times daily as needed (eczema).  10/02/17   Ronnald Nian, MD    Allergies    Fish allergy, Hydrocodone, and Penicillins  Review of Systems   Review of Systems  Constitutional:  Negative for chills and fever.  HENT:  Negative for ear pain and sore throat.   Eyes:  Negative for pain and visual disturbance.  Respiratory:  Positive for shortness of breath. Negative for cough.   Cardiovascular:  Positive for chest pain. Negative for palpitations.  Gastrointestinal:  Negative for abdominal pain and vomiting.  Genitourinary:  Negative for dysuria and hematuria.  Musculoskeletal:  Positive for back  pain. Negative for arthralgias.  Skin:  Negative for color change and rash.  Neurological:  Negative for seizures and syncope.  All other systems reviewed and are negative.  Physical Exam Updated Vital Signs BP 111/73   Pulse 72   Temp 99 F (37.2 C) (Oral)   Resp 14   Ht 5\' 7"  (1.702 m)   Wt (!) 141.1 kg   LMP 02/28/2021   SpO2 99%   BMI 48.71 kg/m   Physical Exam Vitals and nursing note reviewed.  Constitutional:      General: She is not in acute distress.    Appearance: Normal appearance. She is not ill-appearing, toxic-appearing or diaphoretic.  HENT:     Head: Normocephalic and atraumatic.     Nose: No nasal deformity.     Mouth/Throat:     Lips: Pink. No lesions.     Mouth: Mucous membranes are moist. No injury, lacerations, oral lesions or angioedema.     Pharynx: Oropharynx is clear. Uvula midline. No pharyngeal swelling, oropharyngeal exudate, posterior oropharyngeal erythema or uvula swelling.  Eyes:     General: Gaze aligned appropriately. No scleral icterus.       Right eye: No discharge.        Left eye: No discharge.     Conjunctiva/sclera: Conjunctivae normal.     Right eye: Right conjunctiva is not injected. No exudate or hemorrhage.    Left eye: Left conjunctiva is not injected. No exudate or hemorrhage.    Pupils: Pupils are equal, round, and reactive to light.  Cardiovascular:     Rate and Rhythm: Normal rate and regular rhythm.     Pulses: Normal pulses.          Radial pulses are 2+ on the right side and 2+ on the left side.       Dorsalis pedis pulses are 2+ on the right side and 2+ on the left side.     Heart sounds: Normal heart sounds, S1 normal and S2 normal. Heart sounds not distant. No murmur heard.   No friction rub. No gallop. No S3 or S4 sounds.  Pulmonary:     Effort: Pulmonary effort is normal. No accessory muscle usage or respiratory distress.     Breath sounds: Normal breath sounds. No stridor. No wheezing, rhonchi or rales.  Chest:      Chest wall: No tenderness.  Abdominal:     General: Abdomen is flat. Bowel sounds are normal. There is no distension.     Palpations: Abdomen is soft. There is no mass or pulsatile mass.     Tenderness: There is no abdominal tenderness. There is no guarding or rebound.  Musculoskeletal:     Right lower leg: No edema.  Left lower leg: No edema.     Comments: Mild reproducible pain to palpation of mid back. No reproducible pain of anterior chest wall  Skin:    General: Skin is warm and dry.     Coloration: Skin is not jaundiced or pale.     Findings: No bruising, erythema, lesion or rash.  Neurological:     General: No focal deficit present.     Mental Status: She is alert and oriented to person, place, and time.     GCS: GCS eye subscore is 4. GCS verbal subscore is 5. GCS motor subscore is 6.  Psychiatric:        Mood and Affect: Mood normal.        Behavior: Behavior normal. Behavior is cooperative.    ED Results / Procedures / Treatments   Labs (all labs ordered are listed, but only abnormal results are displayed) Labs Reviewed  CBC - Abnormal; Notable for the following components:      Result Value   Hemoglobin 11.5 (*)    HCT 35.8 (*)    All other components within normal limits  COMPREHENSIVE METABOLIC PANEL - Abnormal; Notable for the following components:   Glucose, Bld 120 (*)    Total Bilirubin 0.2 (*)    All other components within normal limits  LIPASE, BLOOD  TROPONIN I (HIGH SENSITIVITY)  TROPONIN I (HIGH SENSITIVITY)    EKG None  Radiology DG Chest 2 View  Result Date: 03/21/2021 CLINICAL DATA:  Chest pain on the right for 2 days, initial encounter EXAM: CHEST - 2 VIEW COMPARISON:  04/28/2019 FINDINGS: The heart size and mediastinal contours are within normal limits. Both lungs are clear. The visualized skeletal structures are unremarkable. IMPRESSION: No active cardiopulmonary disease. Electronically Signed   By: Alcide Clever M.D.   On: 03/21/2021  22:11    Procedures Procedures   Medications Ordered in ED Medications - No data to display  ED Course  I have reviewed the triage vital signs and the nursing notes.  Pertinent labs & imaging results that were available during my care of the patient were reviewed by me and considered in my medical decision making (see chart for details).  Clinical Course as of 03/21/21 2351  Mon Mar 21, 2021  2251 ST on EKG but has improved in the ED [GL]    Clinical Course User Index [GL] Kenora Spayd, Finis Bud, PA-C   MDM Rules/Calculators/A&P HEAR Score: 1                       Patient presents with 3 days of right-sided chest pain that started in her back and extends forward.  She is afebrile and her vital signs are stable. I have low suspicion for pulmonary embolism as her PERC is negative.  Will work-up for ACS however I doubt this is the case.  Patient without gallbladder so no suspect cholecystitis.  Will get lipase to rule out pancreatitis.  We will get CMP to look at liver enzymes.  Work-up was overall unrevealing.  She has baseline anemia.  Lipase normal.  LFTs are not elevated.  Troponin slightly elevated.  Chest x-ray with no acute abnormalities.  EKG with tachycardia that improved without intervention.  Explained to patient that we found no emergent causes to her presentation.  She can treat her symptoms with anti-inflammatories or Tums for gas pains.  She needs to follow-up with her PCP within 1 week for further management.  If she  has continued symptoms or she does not improve she can return to the emergency department  Final Clinical Impression(s) / ED Diagnoses Final diagnoses:  Nonspecific chest pain    Rx / DC Orders ED Discharge Orders     None        Claudie Leach, PA-C 03/21/21 2351    Glendora Score, MD 03/22/21 [phone]

## 2021-03-21 NOTE — ED Triage Notes (Signed)
Pt c/o chest pain under right breast thru to center of back x 2 days

## 2021-03-21 NOTE — Discharge Instructions (Addendum)
You are seen in the ED today for complaints of chest pain.  We did not find any emergent causes of your pain.  Please schedule an appointment with your PCP in the next week for emergency department follow-up visit.  If you continue to worsen, please return to the emergency department.

## 2021-04-20 ENCOUNTER — Other Ambulatory Visit (HOSPITAL_COMMUNITY)
Admission: RE | Admit: 2021-04-20 | Discharge: 2021-04-20 | Disposition: A | Discharge status: Home / Self Care | Payer: 59 | Source: Ambulatory Visit | Attending: Family Medicine | Admitting: Family Medicine

## 2021-04-20 ENCOUNTER — Encounter: Payer: Self-pay | Admitting: Family Medicine

## 2021-04-20 ENCOUNTER — Ambulatory Visit (INDEPENDENT_AMBULATORY_CARE_PROVIDER_SITE_OTHER): Payer: 59 | Admitting: Family Medicine

## 2021-04-20 ENCOUNTER — Other Ambulatory Visit: Payer: Self-pay

## 2021-04-20 VITALS — BP 138/81 | HR 101 | Ht 67.0 in | Wt 316.6 lb

## 2021-04-20 DIAGNOSIS — Z113 Encounter for screening for infections with a predominantly sexual mode of transmission: Secondary | ICD-10-CM

## 2021-04-20 DIAGNOSIS — Z30431 Encounter for routine checking of intrauterine contraceptive device: Secondary | ICD-10-CM

## 2021-04-20 DIAGNOSIS — Z124 Encounter for screening for malignant neoplasm of cervix: Secondary | ICD-10-CM | POA: Insufficient documentation

## 2021-04-20 DIAGNOSIS — Z01411 Encounter for gynecological examination (general) (routine) with abnormal findings: Secondary | ICD-10-CM

## 2021-04-20 DIAGNOSIS — N898 Other specified noninflammatory disorders of vagina: Secondary | ICD-10-CM

## 2021-04-20 NOTE — Patient Instructions (Signed)
Preventive Care 21-33 Years Old, Female Preventive care refers to lifestyle choices and visits with your health care provider that can promote health and wellness. This includes: A yearly physical exam. This is also called an annual wellness visit. Regular dental and eye exams. Immunizations. Screening for certain conditions. Healthy lifestyle choices, such as: Eating a healthy diet. Getting regular exercise. Not using drugs or products that contain nicotine and tobacco. Limiting alcohol use. What can I expect for my preventive care visit? Physical exam Your health care provider may check your: Height and weight. These may be used to calculate your BMI (body mass index). BMI is a measurement that tells if you are at a healthy weight. Heart rate and blood pressure. Body temperature. Skin for abnormal spots. Counseling Your health care provider may ask you questions about your: Past medical problems. Family's medical history. Alcohol, tobacco, and drug use. Emotional well-being. Home life and relationship well-being. Sexual activity. Diet, exercise, and sleep habits. Work and work environment. Access to firearms. Method of birth control. Menstrual cycle. Pregnancy history. What immunizations do I need? Vaccines are usually given at various ages, according to a schedule. Your health care provider will recommend vaccines for you based on your age, medical history, and lifestyle or other factors, such as travel or where you work. What tests do I need? Blood tests Lipid and cholesterol levels. These may be checked every 5 years starting at age 20. Hepatitis C test. Hepatitis B test. Screening Diabetes screening. This is done by checking your blood sugar (glucose) after you have not eaten for a while (fasting). STD (sexually transmitted disease) testing, if you are at risk. BRCA-related cancer screening. This may be done if you have a family history of breast, ovarian, tubal, or  peritoneal cancers. Pelvic exam and Pap test. This may be done every 3 years starting at age 21. Starting at age 30, this may be done every 5 years if you have a Pap test in combination with an HPV test. Talk with your health care provider about your test results, treatment options, and if necessary, the need for more tests. Follow these instructions at home: Eating and drinking  Eat a healthy diet that includes fresh fruits and vegetables, whole grains, lean protein, and low-fat dairy products. Take vitamin and mineral supplements as recommended by your health care provider. Do not drink alcohol if: Your health care provider tells you not to drink. You are pregnant, may be pregnant, or are planning to become pregnant. If you drink alcohol: Limit how much you have to 0-1 drink a day. Be aware of how much alcohol is in your drink. In the [place] one drink equals one 12 oz bottle of beer (355 mL), one 5 oz glass of wine (148 mL), or one 1 oz glass of hard liquor (44 mL). Lifestyle Take daily care of your teeth and gums. Brush your teeth every morning and night with fluoride toothpaste. Floss one time each day. Stay active. Exercise for at least 30 minutes 5 or more days each week. Do not use any products that contain nicotine or tobacco, such as cigarettes, e-cigarettes, and chewing tobacco. If you need help quitting, ask your health care provider. Do not use drugs. If you are sexually active, practice safe sex. Use a condom or other form of protection to prevent STIs (sexually transmitted infections). If you do not wish to become pregnant, use a form of birth control. If you plan to become pregnant, see your health care provider   for a prepregnancy visit. Find healthy ways to cope with stress, such as: Meditation, yoga, or listening to music. Journaling. Talking to a trusted person. Spending time with friends and family. Safety Always wear your seat belt while driving or riding in a  vehicle. Do not drive: If you have been drinking alcohol. Do not ride with someone who has been drinking. When you are tired or distracted. While texting. Wear a helmet and other protective equipment during sports activities. If you have firearms in your house, make sure you follow all gun safety procedures. Seek help if you have been physically or sexually abused. What's next? Go to your health care provider once a year for an annual wellness visit. Ask your health care provider how often you should have your eyes and teeth checked. Stay up to date on all vaccines. This information is not intended to replace advice given to you by your health care provider. Make sure you discuss any questions you have with your health care provider. Document Revised: 08/13/2020 Document Reviewed: 02/14/2018 Elsevier Patient Education  2022 [employer]  

## 2021-04-20 NOTE — Progress Notes (Signed)
Subjective:     Deborah Little is a 35 y.o. female and is here for a comprehensive physical exam. The patient reports problems - cycles have been heavier since visit with PCP for pap last year. Reports her strings became longer then and had to be trimmed. .   The following portions of the patient's history were reviewed and updated as appropriate: allergies, current medications, past family history, past medical history, past social history, past surgical history, and problem list.  Review of Systems Pertinent items noted in HPI and remainder of comprehensive ROS otherwise negative.   Objective:    BP 138/81   Pulse (!) 101   Ht 5\' 7"  (1.702 m)   Wt (!) 316 lb 9.6 oz (143.6 kg)   LMP 04/11/2021 (Exact Date)   BMI 49.59 kg/m  General appearance: alert, cooperative, and appears stated age Head: Normocephalic, without obvious abnormality, atraumatic Neck: no adenopathy, supple, symmetrical, trachea midline, and thyroid not enlarged, symmetric, no tenderness/mass/nodules Lungs: clear to auscultation bilaterally Breasts: normal appearance, no masses or tenderness Heart: regular rate and rhythm, S1, S2 normal, no murmur, click, rub or gallop Abdomen: soft, non-tender; bowel sounds normal; no masses,  no organomegaly Pelvic: cervix normal in appearance, external genitalia normal, no adnexal masses or tenderness, no cervical motion tenderness, uterus normal size, shape, and consistency, vagina normal without discharge, and IUD tip is seen in the cervix Extremities: extremities normal, atraumatic, no cyanosis or edema Pulses: 2+ and symmetric Skin: Skin color, texture, turgor normal. No rashes or lesions Lymph nodes: Cervical, supraclavicular, and axillary nodes normal. Neurologic: Grossly normal    Assessment:    Healthy female exam.      Plan:   Problem List Items Addressed This Visit   None Visit Diagnoses     Screening for malignant neoplasm of cervix    -  Primary   Relevant  Orders   Cytology - PAP( [facility])   Encounter for gynecological examination with abnormal finding       Vaginal discharge       Relevant Orders   Cervicovaginal ancillary only( [facility])   Screen for STD (sexually transmitted disease)       Relevant Orders   Hepatitis B surface antigen   Hepatitis C antibody   HIV Antibody (routine testing w rflx)   RPR   IUD check up       discussed possible removal and re-insertion given symptoms of heavy cycles, unclear efficacy with misplacement, she will consider     Return in 6 weeks (on 06/01/2021) for IUD change.  Return in 6 weeks (on 06/01/2021) for IUD change.    See After Visit Summary for Counseling Recommendations

## 2021-04-21 LAB — CERVICOVAGINAL ANCILLARY ONLY
Bacterial Vaginitis (gardnerella): POSITIVE — AB
Candida Glabrata: NEGATIVE
Candida Vaginitis: NEGATIVE
Comment: NEGATIVE
Comment: NEGATIVE
Comment: NEGATIVE

## 2021-04-21 LAB — HEPATITIS C ANTIBODY: Hep C Virus Ab: <0.1 s/co ratio (ref 0.0–0.9)

## 2021-04-21 LAB — RPR: RPR Ser Ql: NONREACTIVE

## 2021-04-21 LAB — HIV ANTIBODY (ROUTINE TESTING W REFLEX): HIV Screen 4th Generation wRfx: NONREACTIVE

## 2021-04-21 LAB — HEPATITIS B SURFACE ANTIGEN: Hepatitis B Surface Ag: NEGATIVE

## 2021-04-21 MED ORDER — METRONIDAZOLE 500 MG PO TABS: 500.0000 mg | ORAL_TABLET | Freq: Two times a day (BID) | ORAL | 0 refills | Status: DC

## 2021-04-21 NOTE — Addendum Note (Signed)
Addended by: Reva Bores on: 04/21/2021 02:06 PM   Modules accepted: Orders

## 2021-04-25 LAB — CYTOLOGY - PAP
Chlamydia: NEGATIVE
Comment: NEGATIVE
Comment: NEGATIVE
Comment: NEGATIVE
Comment: NORMAL
Diagnosis: NEGATIVE
High risk HPV: NEGATIVE
Neisseria Gonorrhea: NEGATIVE
Trichomonas: POSITIVE — AB

## 2021-04-26 ENCOUNTER — Encounter (INDEPENDENT_AMBULATORY_CARE_PROVIDER_SITE_OTHER): Payer: Self-pay

## 2021-05-10 ENCOUNTER — Encounter (INDEPENDENT_AMBULATORY_CARE_PROVIDER_SITE_OTHER): Payer: Self-pay | Admitting: Family Medicine

## 2021-05-10 ENCOUNTER — Ambulatory Visit (INDEPENDENT_AMBULATORY_CARE_PROVIDER_SITE_OTHER): Payer: 59 | Admitting: Family Medicine

## 2021-05-10 ENCOUNTER — Other Ambulatory Visit: Payer: Self-pay

## 2021-05-10 VITALS — BP 109/66 | HR 93 | Temp 98.5°F | Ht 67.0 in | Wt 316.0 lb

## 2021-05-10 DIAGNOSIS — Z9189 Other specified personal risk factors, not elsewhere classified: Secondary | ICD-10-CM | POA: Diagnosis not present

## 2021-05-10 DIAGNOSIS — E7849 Other hyperlipidemia: Secondary | ICD-10-CM

## 2021-05-10 DIAGNOSIS — R5383 Other fatigue: Secondary | ICD-10-CM | POA: Diagnosis not present

## 2021-05-10 DIAGNOSIS — R0602 Shortness of breath: Secondary | ICD-10-CM

## 2021-05-10 DIAGNOSIS — K760 Fatty (change of) liver, not elsewhere classified: Secondary | ICD-10-CM | POA: Diagnosis not present

## 2021-05-10 DIAGNOSIS — R7303 Prediabetes: Secondary | ICD-10-CM

## 2021-05-10 DIAGNOSIS — E559 Vitamin D deficiency, unspecified: Secondary | ICD-10-CM

## 2021-05-10 DIAGNOSIS — Z6841 Body Mass Index (BMI) 40.0 and over, adult: Secondary | ICD-10-CM

## 2021-05-10 DIAGNOSIS — F39 Unspecified mood [affective] disorder: Secondary | ICD-10-CM

## 2021-05-10 DIAGNOSIS — Z1331 Encounter for screening for depression: Secondary | ICD-10-CM

## 2021-05-10 DIAGNOSIS — G479 Sleep disorder, unspecified: Secondary | ICD-10-CM

## 2021-05-10 NOTE — Progress Notes (Addendum)
Chief Complaint:   OBESITY Deborah Little (MR# [id number]) is a 35 y.o. female who presents for evaluation and treatment of obesity and related comorbidities. Current BMI is Body mass index is 49.49 kg/m. Deborah Little has been struggling with her weight for many years and has been unsuccessful in either losing weight, maintaining weight loss, or reaching her healthy weight goal.  Deborah Little works full time for [employer], she is a mom and has a 46 year old son. Her girlfriend lives with her as well. She used phentermine in the past and lost 40 lbs and doing water aerobics. She eats out everyday almost. She is a Water quality scientist. She craves soda, ice cream, cake, tacos, pasta, fried chicken. She skips 1-2 meals per day sometimes, and she drinks a lot of caloric beverages. She states she can't eat a lot, and she eats small portions but craves a lot.  Deborah Little is currently in the action stage of change and ready to dedicate time achieving and maintaining a healthier weight. Deborah Little is interested in becoming our patient and working on intensive lifestyle modifications including (but not limited to) diet and exercise for weight loss.  Deborah Little's habits were reviewed today and are as follows: Her family eats meals together, she thinks her family will eat healthier with her, her desired weight loss is 141 lbs, she has been heavy most of her life, she started gaining weight after she had her son, her heaviest weight ever was 215 pounds, she skips meals frequently, she is frequently drinking liquids with calories, she frequently makes poor food choices, and she struggles with emotional eating.  Depression Screen Deborah Little's Food and Mood (modified PHQ-9) score was 14.  Depression screen [facility] 2/9 05/10/2021  Decreased Interest 1  Down, Depressed, Hopeless 1  PHQ - 2 Score 2  Altered sleeping 2  Tired, decreased energy 3  Change in appetite 2  Feeling bad or failure about yourself  0  Trouble  concentrating 3  Moving slowly or fidgety/restless 0  Suicidal thoughts 2  PHQ-9 Score 14  Difficult doing work/chores -   Subjective:   1. Other fatigue Deborah Little admits to daytime somnolence and admits to waking up still tired. Patent has a history of symptoms of daytime fatigue and morning fatigue. Deborah Little generally gets 6 hours or less of sleep per night, and states that she has nightime awakenings. Snoring is present. Apneic episodes are not present. Epworth Sleepiness Score is 22. Deborah Little has a history of Vit D deficiency and a history of anemia. I discussed with the patient that bot can contribute to fatigue, etc.  2. Shortness of breath on exertion Deborah Little notes increasing shortness of breath with exercising and seems to be worsening over time with weight gain. She notes getting out of breath sooner with activity than she used to. This has not gotten worse recently. Deborah Little denies shortness of breath at rest or orthopnea.  3. Pre-diabetes Deborah Little primary care physician recently gave her script for Ozempic and denied. She was diagnosed 10 years ago. She has not been on medications in the past.  4. NAFLD (nonalcoholic fatty liver disease) Deborah Little was diagnosed with gallbladder removal 2 years ago. She does drink ETOH socially, but she is not often not on her own. Only 1 day per week on an average.  5. Other hyperlipidemia Deborah Little's last LDL was 114, HDL 37, and triglycerides at 114, with her primary care physician recently in October 2022. She is not on medications.  6. Sleep disorder Deborah Little's Epworth sleepiness score is 22. She has never been tested and told by a dentist 2 months ago that she is very likely to have obstructive sleep apnea.   7. Vitamin D deficiency Deborah Little is currently taking OTC vitamin D, unsure of the dose. Her recent Vit D level was 26.3 in October 2022 with [facility]. She denies nausea, vomiting or muscle weakness.  8. Mood disorder (HCC) with emotional  eating Deborah Little has a history of depression and generalized anxiety disorder. Her PHQ-9 score is 14.  9. At risk for diabetes mellitus Deborah Little is at higher than average risk for developing diabetes due to .   Assessment/Plan:   Orders Placed This Encounter  Procedures   Vitamin B12   Folate   Insulin, random   T4, free   Ambulatory referral to Neurology    Medications Discontinued During This Encounter  Medication Reason   metroNIDAZOLE (FLAGYL) 500 MG tablet      No orders of the defined types were placed in this encounter.    1. Other fatigue Deborah Little does feel that her weight is causing her energy to be lower than it should be. Fatigue may be related to obesity, depression or many other causes. Labs will be ordered, and in the meanwhile, Deborah Little will focus on self care including making healthy food choices, increasing physical activity and focusing on stress reduction.  - Vitamin B12 - Folate - Insulin, random - T4, free  2. Shortness of breath on exertion Deborah Little does feel that she gets out of breath more easily that she used to when she exercises. Deborah Little's shortness of breath appears to be obesity related and exercise induced. She has agreed to work on weight loss and gradually increase exercise to treat her exercise induced shortness of breath. Will continue to monitor closely.  - Vitamin B12 - Folate  3. Pre-diabetes We will check labs today. Deborah Little will call her medical insurance to ask about what diagnosis would qualify for Ozempic coverage.  - Insulin, random - T4, free  4. NAFLD (nonalcoholic fatty liver disease) We discussed the likely diagnosis of non-alcoholic fatty liver disease today and how this condition is obesity related. Deborah Little was educated the importance of weight loss. Deborah Little agreed to continue with her weight loss efforts with healthier diet and exercise as an essential part of her treatment plan.  5. Other  hyperlipidemia Cardiovascular risk and specific lipid/LDL goals reviewed. We discussed several lifestyle modifications today. Oral will continue to work on diet, exercise and weight loss efforts. Orders and follow up as documented in patient record.   Counseling Intensive lifestyle modifications are the first line treatment for this issue. Dietary changes: Increase soluble fiber. Decrease simple carbohydrates. Exercise changes: Moderate to vigorous-intensity aerobic activity 150 minutes per week if tolerated. Lipid-lowering medications: see documented in medical record.  6. Sleep disorder Aquanetta's sleep is no well controlled. We will place a sleep referral for the patient today.   - Ambulatory referral to Neurology  7. Vitamin D deficiency Low Vitamin D level contributes to fatigue and are associated with obesity, breast, and colon cancer. We will discuss all labs at [facility] next office visit. She will likely need a script. She will follow-up for routine testing of Vitamin D, at least 2-3 times per year to avoid over-replacement.  - Vitamin B12 - Folate  8. Mood disorder (HCC) with emotional eating Behavior modification techniques were discussed today to help Abbigal deal with her emotional/non-hunger eating behaviors. Johnnette agreed to  a referral to see Dr. Dewaine Conger, our Bariatric Psychologist. Orders and follow up as documented in patient record.   9. At risk for diabetes mellitus - Tashema was given diabetes prevention education and counseling today of more than 9 minutes.  - Counseled patient on pathophysiology of disease and meaning/ implication of lab results.  - Reviewed how certain foods can either stimulate or inhibit insulin release, and subsequently affect hunger pathways  - Importance of following a healthy meal plan with limiting amounts of simple carbohydrates discussed with patient - Effects of regular aerobic exercise on blood sugar regulation reviewed and  encouraged an eventual goal of 30 min 5d/week or more as a minimum.  - Briefly discussed treatment options, which always include dietary and lifestyle modification as first line.   - Handouts provided at patient's desire and/or told to go online to the American Diabetes Association website for further information.  10. Obesity with current BMI of 49.6 Elishia is currently in the action stage of change and her goal is to continue with weight loss efforts. I recommend Areebah begin the structured treatment plan as follows:  She has agreed to the Category 2 Plan.  Exercise goals: As is.   Behavioral modification strategies: no skipping meals, avoiding temptations, and planning for success.  She was informed of the importance of frequent follow-up visits to maximize her success with intensive lifestyle modifications for her multiple health conditions. She was informed we would discuss her lab results at her next visit unless there is a critical issue that needs to be addressed sooner. Dania agreed to keep her next visit at the agreed upon time to discuss these results.  Objective:   Blood pressure 109/66, pulse 93, temperature 98.5 F (36.9 C), height 5\' 7"  (1.702 m), weight (!) 316 lb (143.3 kg), last menstrual period 04/11/2021, SpO2 100 %. Body mass index is 49.49 kg/m.  EKG: Normal sinus rhythm, rate 105 BPM.  Indirect Calorimeter completed today shows a VO2 of 355 and a REE of 2448.  Her calculated basal metabolic rate is 04/13/2021 thus her basal metabolic rate is better than expected.  General: Cooperative, alert, well developed, in no acute distress. HEENT: Conjunctivae and lids unremarkable. Cardiovascular: Regular rhythm.  Lungs: Normal work of breathing. Neurologic: No focal deficits.   Lab Results  Component Value Date   CREATININE 0.83 03/21/2021   BUN 10 03/21/2021   NA 135 03/21/2021   K 3.5 03/21/2021   CL 103 03/21/2021   CO2 27 03/21/2021   Lab Results  Component  Value Date   ALT 17 03/21/2021   AST 23 03/21/2021   ALKPHOS 53 03/21/2021   BILITOT 0.2 (L) 03/21/2021   Lab Results  Component Value Date   HGBA1C 5.6% 11/22/2016   HGBA1C 5.6 02/01/2016   HGBA1C 5.9 (H) 07/09/2014   No results found for: INSULIN Lab Results  Component Value Date   TSH 0.93 02/26/2018   Lab Results  Component Value Date   CHOL 159 02/01/2016   HDL 35 (L) 02/01/2016   LDLCALC 108 02/01/2016   TRIG 80 02/01/2016   CHOLHDL 4.5 02/01/2016   Lab Results  Component Value Date   WBC 8.6 03/21/2021   HGB 11.5 (L) 03/21/2021   HCT 35.8 (L) 03/21/2021   MCV 86.1 03/21/2021   PLT 321 03/21/2021   No results found for: IRON, TIBC, FERRITIN  Attestation Statements:   Reviewed by clinician on day of visit: allergies, medications, problem list, medical history, surgical history, family  history, social history, and previous encounter notes.   Trude Mcburney, am acting as transcriptionist for [employer], DO.  I have reviewed the above documentation for accuracy and completeness, and I agree with the above. Carlye Grippe, D.O.  The 21st Century Cures Act was signed into law in 2016 which includes the topic of electronic health records.  This provides immediate access to information in MyChart.  This includes consultation notes, operative notes, office notes, lab results and pathology reports.  If you have any questions about what you read please let us know at your next visit so we can discuss your concerns and take corrective action if need be.  We are right here with you.
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
## 2021-05-11 LAB — VITAMIN B12: Vitamin B-12: 438 pg/mL (ref 232–1245)

## 2021-05-11 LAB — T4, FREE: Free T4: 0.97 ng/dL (ref 0.82–1.77)

## 2021-05-11 LAB — FOLATE: Folate: 10.4 ng/mL (ref >3.0)

## 2021-05-11 LAB — INSULIN, RANDOM: INSULIN: 36.2 u[IU]/mL — ABNORMAL HIGH (ref 2.6–24.9)

## 2021-05-17 ENCOUNTER — Telehealth (INDEPENDENT_AMBULATORY_CARE_PROVIDER_SITE_OTHER): Payer: 59 | Admitting: Psychology

## 2021-05-17 DIAGNOSIS — F5089 Other specified eating disorder: Secondary | ICD-10-CM | POA: Diagnosis not present

## 2021-05-24 ENCOUNTER — Ambulatory Visit (INDEPENDENT_AMBULATORY_CARE_PROVIDER_SITE_OTHER): Payer: 59 | Admitting: Family Medicine

## 2021-05-30 ENCOUNTER — Telehealth (INDEPENDENT_AMBULATORY_CARE_PROVIDER_SITE_OTHER): Payer: Self-pay | Admitting: Psychology

## 2021-05-30 ENCOUNTER — Encounter (INDEPENDENT_AMBULATORY_CARE_PROVIDER_SITE_OTHER): Payer: Self-pay

## 2021-05-30 NOTE — Telephone Encounter (Signed)
  Office: [phone]  /  Fax: [phone]  Date of Call: May 30, 2021  Time of Call: 3:12pm Provider: Lawerance Cruel, PsyD  CONTENT: This provider called Deborah Little to check-in as she abruptly ended her initial appointment with his provider via MyChart Video Visit on May 17, 2021 as she needed to drive to the court house for work. Deborah Little (front Information systems manager) spoke with Deborah Little around 5pm on May 17, 2021 and Deborah Little apologized for ending the appointment abruptly and reportedly noted she did not realize she could not be in the car driving while attending the appointment. She reportedly indicated she would call back to schedule another appointment. A HIPAA compliant voicemail requesting a call back could not be left as her mailbox is not set up.   PLAN: This provider will wait for Deborah Little to call back. No further follow-up planned by this provider.

## 2021-06-03 ENCOUNTER — Encounter: Payer: Self-pay | Admitting: Family Medicine

## 2021-06-03 ENCOUNTER — Other Ambulatory Visit: Payer: Self-pay

## 2021-06-03 ENCOUNTER — Ambulatory Visit (INDEPENDENT_AMBULATORY_CARE_PROVIDER_SITE_OTHER): Payer: 59 | Admitting: Family Medicine

## 2021-06-03 VITALS — BP 120/60 | HR 85 | Wt 317.8 lb

## 2021-06-03 DIAGNOSIS — Z30432 Encounter for removal of intrauterine contraceptive device: Secondary | ICD-10-CM | POA: Diagnosis not present

## 2021-06-03 DIAGNOSIS — Z30011 Encounter for initial prescription of contraceptive pills: Secondary | ICD-10-CM

## 2021-06-03 MED ORDER — DROSPIRENONE-ETHINYL ESTRADIOL 3-0.02 MG PO TABS: 1.0000 | ORAL_TABLET | Freq: Every day | ORAL | 3 refills | Status: DC

## 2021-06-06 ENCOUNTER — Encounter: Payer: Self-pay | Admitting: Family Medicine

## 2021-06-06 NOTE — Progress Notes (Signed)
° °  Subjective:    Patient ID: Deborah Little is a 35 y.o. female presenting with IUD removal  on 06/03/2021  HPI: Here today for IUD removal. Previously noted to be in cervix. Has decided on COC's. On Yaz in the past and this worked well.  Seeing [facility] for weight loss.  Review of Systems  Constitutional:  Negative for chills and fever.  Respiratory:  Negative for shortness of breath.   Cardiovascular:  Negative for chest pain.  Gastrointestinal:  Negative for abdominal pain, nausea and vomiting.  Genitourinary:  Negative for dysuria.  Skin:  Negative for rash.     Objective:    BP 120/60    Pulse 85    Wt (!) 317 lb 12.8 oz (144.2 kg)    LMP 05/17/2021    BMI 49.77 kg/m  Physical Exam Constitutional:      General: She is not in acute distress.    Appearance: She is well-developed.  HENT:     Head: Normocephalic and atraumatic.  Eyes:     General: No scleral icterus. Cardiovascular:     Rate and Rhythm: Normal rate.  Pulmonary:     Effort: Pulmonary effort is normal.  Abdominal:     Palpations: Abdomen is soft.  Genitourinary:    Comments: BUS normal, vagina is pink and rugated, cervix is parous without lesion  Musculoskeletal:     Cervical back: Neck supple.  Skin:    General: Skin is warm and dry.  Neurological:     Mental Status: She is alert and oriented to person, place, and time.   Procedure: Speculum placed inside vagina.  Cervix visualized.  Strings grasped with ring forceps.  IUD removed intact.      Assessment & Plan:  Encounter for initial prescription of contraceptive pills - ok to re-start Yaz - Plan: drospirenone-ethinyl estradiol (YAZ) 3-0.02 MG tablet  Encounter for IUD removal - removed intact   Return if symptoms worsen or fail to improve.  Reva Bores 06/06/2021 9:14 AM

## 2021-06-28 ENCOUNTER — Other Ambulatory Visit: Payer: Self-pay

## 2021-06-28 ENCOUNTER — Encounter (INDEPENDENT_AMBULATORY_CARE_PROVIDER_SITE_OTHER): Payer: Self-pay | Admitting: Family Medicine

## 2021-06-28 ENCOUNTER — Ambulatory Visit (INDEPENDENT_AMBULATORY_CARE_PROVIDER_SITE_OTHER): Payer: 59 | Admitting: Family Medicine

## 2021-06-28 VITALS — BP 133/77 | HR 78 | Temp 98.8°F | Ht 67.0 in | Wt 315.0 lb

## 2021-06-28 DIAGNOSIS — Z6841 Body Mass Index (BMI) 40.0 and over, adult: Secondary | ICD-10-CM

## 2021-06-28 DIAGNOSIS — E7849 Other hyperlipidemia: Secondary | ICD-10-CM

## 2021-06-28 DIAGNOSIS — E559 Vitamin D deficiency, unspecified: Secondary | ICD-10-CM

## 2021-06-28 DIAGNOSIS — R7303 Prediabetes: Secondary | ICD-10-CM

## 2021-06-28 DIAGNOSIS — F39 Unspecified mood [affective] disorder: Secondary | ICD-10-CM

## 2021-06-28 DIAGNOSIS — R5383 Other fatigue: Secondary | ICD-10-CM | POA: Diagnosis not present

## 2021-06-28 DIAGNOSIS — Z9189 Other specified personal risk factors, not elsewhere classified: Secondary | ICD-10-CM | POA: Diagnosis not present

## 2021-06-28 DIAGNOSIS — E669 Obesity, unspecified: Secondary | ICD-10-CM

## 2021-06-28 MED ORDER — VITAMIN D (ERGOCALCIFEROL) 1.25 MG (50000 UNIT) PO CAPS: 50000.0000 [IU] | ORAL_CAPSULE | ORAL | 0 refills | Status: DC

## 2021-06-29 NOTE — Progress Notes (Signed)
Chief Complaint:   OBESITY Deborah Little is here to discuss her progress with her obesity treatment plan along with follow-up of her obesity related diagnoses. Deborah Little is on the Category 2 Plan and states she is following her eating plan approximately 0% of the time. Deborah Little states she is participating in water aerobics 60 minutes per session for 2 times per week.  Today's visit was #: 2 Starting weight: 316 lbs Starting date: 05/10/2021 Today's weight: 315 lbs Today's date: 06/28/2021 Total lbs lost to date: 1 Total lbs lost since last in-office visit: 1  Interim History: Deborah Little is here today for her first follow-up office visit since starting the program with [place].  All blood work/ lab tests that were recently ordered by myself or an outside provider were reviewed with patient today per their request.   Extended time was spent counseling her on all new disease processes that were discovered or preexisting ones that are affected by BMI.  she understands that many of these abnormalities will need to monitored regularly along with the current treatment plan of prudent dietary changes, in which we are making each and every office visit, to improve these health parameters. Pt has not been following plan at all. She feels disappointed in herself and feels she needs medication for weight loss. Pt states she barely eats and feels tired all the time. She did make an appointment with neurology for OSA evaluation later this month, as she is still not sleeping well.  Subjective:   1. Prediabetes Discussed labs with patient today. 04/01/2021 A1c 5.7. Prior to weight loss, docs could not get GLP-1 covered by pt's insurance. Pt denies carb cravings or hunger. She tells me she can go all day and not eat.  2. Other hyperlipidemia Discussed labs with patient today. 04/01/21 Elevated LDL = 114 and HDL 37. Pt is not on meds.   3. Vitamin D deficiency Discussed labs with patient today. 04/01/21  Vit D level 26.3. Pt never started the Ergocalciferol even though she had Rx. She reports fatigue.  4. Other fatigue Discussed labs with patient today. Pt is still not sleeping. She can go long periods and not eat. H/o anemia- CBC within normal limits 3 months ago.  5. Mood disorder ([place]) with emotional eating H/o depression and GAD. Pt referred to Dr. Mallie Mussel for emotional eating habits and met on 05/17/21. Pt never made a f/u OV with Dr. Mallie Mussel. She was last on mood meds in 2017- Celexa.   6. At risk for diabetes mellitus Deborah Little is at higher than average risk for developing diabetes due to pre-diabetes.   Assessment/Plan:  No orders of the defined types were placed in this encounter.   Medications Discontinued During This Encounter  Medication Reason   ergocalciferol (VITAMIN D2) 1.25 MG (50000 UT) capsule      Meds ordered this encounter  Medications   Vitamin D, Ergocalciferol, (DRISDOL) 1.25 MG (50000 UNIT) CAPS capsule    Sig: Take 1 capsule (50,000 Units total) by mouth every 7 (seven) days.    Dispense:  4 capsule    Refill:  0     1. Prediabetes Handouts given on insulin resistance and pre-diabetes. Prior auth failed to get insurance to pay for Ozempic with diagnosis of pre-diabetes. Pt declines metformin today. I recommend prudent nutritional plan and weight loss.  2. Other hyperlipidemia Decrease sat/trans fat intake to lower LDL and increase activity to increase HDL. Follow up with PCP regarding chronic routine screenings  and care. Follow prudent nutritional plan and eventually exercise.  3. Vitamin D deficiency Plan: - Discussed importance of vitamin D to their health and well-being.  - possible symptoms of low Vitamin D can be low energy, depressed mood, muscle aches, joint aches, osteoporosis etc. - low Vitamin D levels may be linked to an increased risk of cardiovascular events and even increased risk of cancers- such as colon and breast.  - I recommend pt  take a 50,000 IU weekly prescription vit D - see script below   - Informed patient this may be a lifelong thing, and she was encouraged to continue to take the medicine until told otherwise.   - we will need to monitor levels regularly (every 3-4 mo on average) to keep levels within normal limits.  - weight loss will likely improve availability of vitamin D, thus encouraged Deborah Little to continue with meal plan and their weight loss efforts to further improve this condition - pt's questions and concerns regarding this condition addressed.  Start- Vitamin D, Ergocalciferol, (DRISDOL) 1.25 MG (50000 UNIT) CAPS capsule; Take 1 capsule (50,000 Units total) by mouth every 7 (seven) days.  Dispense: 4 capsule; Refill: 0  4. Other fatigue F/u with neurology in the near future for OSA evaluation. Pt needs to improve health habits and nutrition in order to feel better.  5. Mood disorder ([place]) with emotional eating I continue to recommend counseling to pt for her emotions and how they affect her current eating habits. Mood appears stable otherwise, but is it worsens, I recommend CBT on her own with separate counselor. Improve nutrition and eventually exercise to help improve mood.  6. At risk for diabetes mellitus - Deborah Little was given diabetes prevention education and counseling today of more than 23 minutes.  - Counseled patient on pathophysiology of disease and meaning/ implication of lab results.  - Reviewed how certain foods can either stimulate or inhibit insulin release, and subsequently affect hunger pathways  - Importance of following a healthy meal plan with limiting amounts of simple carbohydrates discussed with patient - Effects of regular aerobic exercise on blood sugar regulation reviewed and encouraged an eventual goal of 30 min 5d/week or more as a minimum.  - Briefly discussed treatment options, which always include dietary and lifestyle modification as first line.   - Handouts provided at  patient's desire and/or told to go online to the American Diabetes Association website for further information.  7. Obesity with current BMI of 49.4  Deborah Little is currently in the action stage of change. As such, her goal is to continue with weight loss efforts. She has agreed to the Category 2 Plan with lunch options.   Food prep ideas discussed with pt extensively today. Recipes provided.  Exercise goals:  As is  Behavioral modification strategies: increasing lean protein intake, no skipping meals, meal planning and cooking strategies, avoiding temptations, and planning for success.  Deborah Little has agreed to follow-up with our clinic in 2 weeks. She was informed of the importance of frequent follow-up visits to maximize her success with intensive lifestyle modifications for her multiple health conditions.   Objective:   Blood pressure 133/77, pulse 78, temperature 98.8 F (37.1 C), height $RemoveBe'5\' 7"'kxsLFGBIt$  (1.702 m), weight (!) 315 lb (142.9 kg), SpO2 99 %. Body mass index is 49.34 kg/m.  General: Cooperative, alert, well developed, in no acute distress. HEENT: Conjunctivae and lids unremarkable. Cardiovascular: Regular rhythm.  Lungs: Normal work of breathing. Neurologic: No focal deficits.   Lab Results  Component Value Date   CREATININE 0.83 03/21/2021   BUN 10 03/21/2021   NA 135 03/21/2021   K 3.5 03/21/2021   CL 103 03/21/2021   CO2 27 03/21/2021   Lab Results  Component Value Date   ALT 17 03/21/2021   AST 23 03/21/2021   ALKPHOS 53 03/21/2021   BILITOT 0.2 (L) 03/21/2021   Lab Results  Component Value Date   HGBA1C 5.6% 11/22/2016   HGBA1C 5.6 02/01/2016   HGBA1C 5.9 (H) 07/09/2014   Lab Results  Component Value Date   INSULIN 36.2 (H) 05/10/2021   Lab Results  Component Value Date   TSH 0.93 02/26/2018   Lab Results  Component Value Date   CHOL 159 02/01/2016   HDL 35 (L) 02/01/2016   LDLCALC 108 02/01/2016   TRIG 80 02/01/2016   CHOLHDL 4.5 02/01/2016    Lab Results  Component Value Date   VD25OH 33 08/08/2018   VD25OH 9 (L) 02/26/2018   Lab Results  Component Value Date   WBC 8.6 03/21/2021   HGB 11.5 (L) 03/21/2021   HCT 35.8 (L) 03/21/2021   MCV 86.1 03/21/2021   PLT 321 03/21/2021   Attestation Statements:   Reviewed by clinician on day of visit: allergies, medications, problem list, medical history, surgical history, family history, social history, and previous encounter notes.  Coral Ceo, CMA, am acting as transcriptionist for [employer], DO.  I have reviewed the above documentation for accuracy and completeness, and I agree with the above. Marjory Sneddon, D.O.  The [place] was signed into law in 2016 which includes the topic of electronic health records.  This provides immediate access to information in MyChart.  This includes consultation notes, operative notes, office notes, lab results and pathology reports.  If you have any questions about what you read please let us know at your next visit so we can discuss your concerns and take corrective action if need be.  We are right here with you.

## 2021-07-11 ENCOUNTER — Ambulatory Visit (INDEPENDENT_AMBULATORY_CARE_PROVIDER_SITE_OTHER): Payer: 59 | Admitting: Family Medicine

## 2021-08-10 ENCOUNTER — Ambulatory Visit (INDEPENDENT_AMBULATORY_CARE_PROVIDER_SITE_OTHER): Payer: 59 | Admitting: Neurology

## 2021-08-10 ENCOUNTER — Encounter: Payer: Self-pay | Admitting: Neurology

## 2021-08-10 VITALS — BP 143/91 | HR 89 | Ht 67.0 in | Wt 309.0 lb

## 2021-08-10 DIAGNOSIS — G4710 Hypersomnia, unspecified: Secondary | ICD-10-CM

## 2021-08-10 DIAGNOSIS — G4700 Insomnia, unspecified: Secondary | ICD-10-CM | POA: Diagnosis not present

## 2021-08-10 DIAGNOSIS — G478 Other sleep disorders: Secondary | ICD-10-CM

## 2021-08-10 DIAGNOSIS — G4719 Other hypersomnia: Secondary | ICD-10-CM | POA: Diagnosis not present

## 2021-08-10 DIAGNOSIS — G4730 Sleep apnea, unspecified: Secondary | ICD-10-CM

## 2021-08-10 NOTE — Patient Instructions (Signed)
Obesity-Hypoventilation Syndrome Obesity-hypoventilation syndrome (OHS) is a condition in which a person cannot efficiently move air in and out of the lungs (ventilate). This condition causes a buildup of carbon dioxidelevels in the blood and a drop in oxygen levels. OHS can increase the risk for: Cor pulmonale, or right-sided heart failure. Left-sided heart failure. Pulmonary hypertension, or high blood pressure in the arteries in the lungs. Too many red blood cells in the body. Disability or death. What are the causes? This condition may be caused by: Being obese with a BMI (body mass index) greater than or equal to 30 kg/m2. Having too much fat around the abdomen, chest, and neck. The brain being unable to properly manage the high carbon dioxide and low oxygen levels. Hormones made by fat cells. These hormones may interfere with breathing. Sleep apnea. This is when breathing stops, pauses, or is shallow during sleep. What are the signs or symptoms? Symptoms of this condition include: Feeling sleepy during the day. Headaches. These may be worse in the morning. Shortness of breath. Snoring, choking, gasping, or trouble breathing during sleep. Poor concentration or poor memory. Mood changes or feeling irritable. Depression. How is this diagnosed? This condition may be diagnosed by: BMI measurement. Blood tests to measure blood levels of serum bicarbonate, carbon dioxide, and oxygen. Pulse oximetry to measure the amount of oxygen in your blood. This uses a small device that is placed on your finger, earlobe, or toe. Polysomnogram, or sleep study, to check your breathing patterns and levels of oxygen and carbon dioxide while you sleep. You may also have other tests, including: A chest X-ray to rule out other breathing problems. Lung tests, or pulmonary function tests, to rule out other breathing problems. Electrocardiogram (ECG) or echocardiogram to check for signs of heart  failure. How is this treated? This condition may be treated with: A device such as a continuous positive airway pressure (CPAP) machine or a bi-level positive airway pressure (BIPAP) machine. These devices deliver pressure and sometimes oxygen to make breathing easier. A mask may be placed over your nose or mouth. Oxygen if your blood oxygen levels are low. A weight-loss program. Bariatric, or weight-loss, surgery. Tracheostomy. A tube is placed in the windpipe through the neck to help with breathing. Follow these instructions at home: Medicines Take over-the-counter and prescription medicines only as told by your health care provider. Ask your health care provider what medicines are safe for you. You may be told to avoid medicines such as sedatives and narcotics. These can affect breathing and make OHS worse. Sleeping habits If you are prescribed a CPAP or a BIPAP machine, make sure you understand how to use it. Use your CPAP or BIPAP machine only as told by your health care provider. Try to get at least 8 hours of sleep every night. Eating and drinking  Eat foods that are high in fiber, such as beans, whole grains, and fresh fruits and vegetables. Limit foods that are high in fat and processed sugars, such as fried or sweet foods. Drink enough fluid to keep your urine pale yellow. Do not drink alcohol if: Your health care provider tells you not to drink. You are pregnant, may be pregnant, or are planning to become pregnant. General instructions Follow a diet and exercise plan that helps you reach and keep a healthy weight as told by your health care provider. Exercise regularly as told by your health care provider. Do not use any products that contain nicotine or tobacco. These products include  cigarettes, chewing tobacco, and vaping devices, such as e-cigarettes. If you need help quitting, ask your health care provider. Keep all follow-up visits. This is important. Contact a health  care provider if: You develop new or worsening shortness of breath. You are having trouble waking up or staying awake. You are confused. You develop a cough. You have a fever. Get help right away if: You have chest pain. You have fast or irregular heartbeats. You are dizzy or you faint. You have any symptoms of a stroke. "BE FAST" is an easy way to remember the main warning signs of a stroke: B - Balance. Signs are dizziness, sudden trouble walking, or loss of balance. E - Eyes. Signs are trouble seeing or a sudden change in vision. F - Face. Signs are sudden weakness or numbness of the face, or the face or eyelid drooping on one side. A - Arms. Signs are weakness or numbness in an arm. This happens suddenly and usually on one side of the body. S - Speech. Signs are sudden trouble speaking, slurred speech, or trouble understanding what people say. T - Time. Time to call emergency services. Write down what time symptoms started. You have other signs of a stroke, such as: A sudden, severe headache with no known cause. Nausea or vomiting. Seizure. These symptoms may be an emergency. Get help right away. Call 911. Do not wait to see if the symptoms will go away. Do not drive yourself to the hospital. Summary Obesity-hypoventilation syndrome (OHS) causes a buildup of carbon dioxidelevels in the blood and a drop in oxygen levels. OHS can increase the risk for heart failure, pulmonary hypertension, disability, and death. Follow your diet and exercise plan as told by your health care provider. Get help right away if you have any symptoms of a stroke. This information is not intended to replace advice given to you by your health care provider. Make sure you discuss any questions you have with your health care provider. Document Revised: 01/11/2021 Document Reviewed: 01/11/2021 Elsevier Patient Education  2022 Elsevier Inc. Quality Sleep Information, Adult Quality sleep is important for your  mental and physical health. It also improves your quality of life. Quality sleep means you: Are asleep for most of the time you are in bed. Fall asleep within 30 minutes. Wake up no more than once a night.  Are awake for no longer than 20 minutes if you do wake up during the night. Most adults need 7-8 hours of quality sleep each night. How can poor sleep affect me? If you do not get enough quality sleep, you may have: Mood swings. Daytime sleepiness. Confusion. Decreased reaction time. Sleep disorders, such as insomnia and sleep apnea. Difficulty with: Solving problems. Coping with stress. Paying attention. These issues may affect your performance and productivity at work, school, and at home. Lack of sleep may also put you at higher risk for accidents, suicide, and risky behaviors. If you do not get quality sleep you may also be at higher risk for several health problems, including: Infections. Type 2 diabetes. Heart disease. High blood pressure. Obesity. Worsening of long-term conditions, like arthritis, kidney disease, depression, Parkinson's disease, and epilepsy. What actions can I take to get more quality sleep?   Stick to a sleep schedule. Go to sleep and wake up at about the same time each day. Do not try to sleep less on weekdays and make up for lost sleep on weekends. This does not work. Try to get about 30 minutes  of exercise on most days. Do not exercise 2-3 hours before going to bed. Limit naps during the day to 30 minutes or less. Do not use any products that contain nicotine or tobacco, such as cigarettes or e-cigarettes. If you need help quitting, ask your health care provider. Do not drink caffeinated beverages for at least 8 hours before going to bed. Coffee, tea, and some sodas contain caffeine. Do not drink alcohol close to bedtime. Do not eat large meals close to bedtime. Do not take naps in the late afternoon. Try to get at least 30 minutes of sunlight every  day. Morning sunlight is best. Make time to relax before bed. Reading, listening to music, or taking a hot bath promotes quality sleep. Make your bedroom a place that promotes quality sleep. Keep your bedroom dark, quiet, and at a comfortable room temperature. Make sure your bed is comfortable. Take out sleep distractions like TV, a computer, smartphone, and bright lights. If you are lying awake in bed for longer than 20 minutes, get up and do a relaxing activity until you feel sleepy. Work with your health care provider to treat medical conditions that may affect sleeping, such as: Nasal obstruction. Snoring. Sleep apnea and other sleep disorders. Talk to your health care provider if you think any of your prescription medicines may cause you to have difficulty falling or staying asleep. If you have sleep problems, talk with a sleep consultant. If you think you have a sleep disorder, talk with your health care provider about getting evaluated by a specialist. Where to find more information National Sleep Foundation website: https://sleepfoundation.org National Heart, Lung, and Blood Institute (NHLBI): https://hall.info/.pdf Centers for Disease Control and Prevention (CDC): DetailSports.is Contact a health care provider if you: Have trouble getting to sleep or staying asleep. Often wake up very early in the morning and cannot get back to sleep. Have daytime sleepiness. Have daytime sleep attacks of suddenly falling asleep and sudden muscle weakness (narcolepsy). Have a tingling sensation in your legs with a strong urge to move your legs (restless legs syndrome). Stop breathing briefly during sleep (sleep apnea). Think you have a sleep disorder or are taking a medicine that is affecting your quality of sleep. Summary Most adults need 7-8 hours of quality sleep each night. Getting enough quality sleep is an important part of health and  well-being. Make your bedroom a place that promotes quality sleep and avoid things that may cause you to have poor sleep, such as alcohol, caffeine, smoking, and large meals. Talk to your health care provider if you have trouble falling asleep or staying asleep. This information is not intended to replace advice given to you by your health care provider. Make sure you discuss any questions you have with your health care provider. Document Revised: 09/12/2017 Document Reviewed: 09/12/2017 Elsevier Patient Education  2022 [employer].

## 2021-08-10 NOTE — Progress Notes (Signed)
SLEEP MEDICINE CLINIC    Provider:  Melvyn Novas, MD  Primary Care Physician:  Stevphen Rochester, MD [address] [place] [address]     Referring Provider: [facility], Karlene Lineman, Drucilla Schmidt [address] [place],  [place] [address]          Chief Complaint according to patient   Patient presents with:     New Patient (Initial Visit)           HISTORY OF PRESENT ILLNESS:  Deborah Little is a 36 y.o. and seen on 08-09-2021 in Consultation , requested by healthy weight [facility].  Chief concern according to patient :  Weight and wellness referral for sleep disorder. Pt has noticed extreme daytime sleepiness. Vitamin D is low. Deals with depression. Some slight snoring but not loud. Was seen by her dentist and suggested work up. No prior SS done.     I have the pleasure of seeing Deborah Little today, a right -handed [place] American female with a possible sleep disorder.  She has a past medical history of Anemia, Anxiety, Bilateral swelling of feet and ankles, Biliary dyskinesia (02/2018), Cholecystectomy, Depression, Fatty liver,  Hyperlipidemia, generalized Joint pain, Obesity, Osteoarthritis, Prediabetes, Shortness of breath on exertion, Sleep apnea, Swallowing difficulty, and Vitamin D deficiency.   Sleep relevant medical history: Nocturia 2-3 times.    Family medical /sleep history: No other family member on CPAP with OSA. Mother has insomnia.    Social history:  Patient is working as Public house manager , [employer], and lives in a household with GF ( in home care taker with mother and with her 54 year-old son-   The patient currently works 8-5 PM.  Tobacco IHK:VQQV .  ETOH use infrequent,  Caffeine intake in form of Soda( 1 a day) . Regular exercise in form of gym, 30 minutes a day.  High protein diet.   Sleep habits are as follows: The patient's dinner time is between 5-6 PM.  The patient goes to bed at  variable times, 12-2 AM,  washing clothes, feeling awake at 2 AM ! and continues to sleep for 3-4 hours, wakes for several bathroom breaks, the first time at 2 AM.   The preferred sleep position is right sided , with the support of 1 pillow.  Dreams are reportedly rare.  5.45  AM is the usual rise time. The patient wakes up with an alarm. She reports not feeling refreshed or restored in AM, with symptoms such as dry mouth, joint pain , and residual fatigue. Naps are taken frequently, at lunch break, lasting from 30 to 60 minutes and are more refreshing than nocturnal sleep.    Review of Systems: Out of a complete 14 system review, the patient complains of only the following symptoms, and all other reviewed systems are negative.:  Fatigue, sleepiness , snoring, fragmented sleep, Insomnia - cyclic insomnia is present, most likely related to a mental condition.    How likely are you to doze in the following situations: 0 = not likely, 1 = slight chance, 2 = moderate chance, 3 = high chance   Sitting and Reading? Watching Television? Sitting inactive in a public place (theater or meeting)? As a passenger in a car for an hour without a break? Lying down in the afternoon when circumstances permit? Sitting and talking to someone? Sitting quietly after lunch without alcohol? In a car,  while stopped for a few minutes in traffic?   Total = 18/ 24 points .  " I fall asleep in the cinema, while reading. I have to keep moving"   FSS endorsed at na/ 63 points.   Social History   Socioeconomic History   Marital status: Single    Spouse name: Not on file   Number of children: 1   Years of education: Not on file   Highest education level: Some college, no degree  Occupational History   Occupation: SOCIAL SERVICES    Employer: [employer]  Tobacco Use   Smoking status: Never   Smokeless tobacco: Never  Vaping Use   Vaping Use: Never used  Substance and Sexual Activity   Alcohol use: Not  Currently   Drug use: No   Sexual activity: Yes    Partners: Male    Comment: Yaz  Other Topics Concern   Not on file  Social History Narrative   Lives at home with child   Right handed   Caffeine: 1 cup of coffee a day   Social Determinants of Corporate investment banker Strain: Not on file  Food Insecurity: No Food Insecurity   Worried About Programme researcher, broadcasting/film/video in the Last Year: Never true   Ran Out of Food in the Last Year: Never true  Transportation Needs: No Transportation Needs   Lack of Transportation (Medical): No   Lack of Transportation (Non-Medical): No  Physical Activity: Not on file  Stress: Not on file  Social Connections: Not on file    Family History  Problem Relation Age of Onset   Obesity Mother    Drug abuse Mother    Sleep apnea Mother    Anxiety disorder Mother    Depression Mother    Hypertension Mother    Obesity Father    Drug abuse Father    Alcoholism Father    Hypertension Father    Swallowing difficulties Brother    Stroke Maternal Grandmother    Depression Other    Anxiety disorder Other    Sleep apnea Other    Alcoholism Other    Drug abuse Other    Obesity Other     Past Medical History:  Diagnosis Date   Anemia    Anxiety    Bilateral swelling of feet and ankles    Biliary dyskinesia 02/2018   Chest pain    Depression    Fatty liver    Fish allergy    Gallbladder problem    Hyperlipidemia    Joint pain    Obesity    Osteoarthritis    Other fatigue    Prediabetes    Shortness of breath    Shortness of breath on exertion    Sleep apnea    Swallowing difficulty    Vitamin D deficiency     Past Surgical History:  Procedure Laterality Date   CHOLECYSTECTOMY N/A 03/13/2018   Procedure: LAPAROSCOPIC CHOLECYSTECTOMY;  Surgeon: Abigail Miyamoto, MD;  Location: [facility];  Service: General;  Laterality: N/A;   GALLBLADDER SURGERY  2018   WISDOM TOOTH EXTRACTION       Current Outpatient Medications  on File Prior to Visit  Medication Sig Dispense Refill   drospirenone-ethinyl estradiol (YAZ) 3-0.02 MG tablet Take 1 tablet by mouth daily. 84 tablet 3   triamcinolone cream (KENALOG) 0.1 % Apply topically 2 (two) times daily. 45 g 0   Vitamin D, Ergocalciferol, (DRISDOL) 1.25 MG (50000 UNIT) CAPS  capsule Take 1 capsule (50,000 Units total) by mouth every 7 (seven) days. 4 capsule 0   No current facility-administered medications on file prior to visit.    Allergies  Allergen Reactions   Fish Allergy Shortness Of Breath   Hydrocodone Nausea And Vomiting   Penicillins Hives    Physical exam:  Today's Vitals   08/10/21 0950  BP: (!) 143/91  Pulse: 89  Weight: (!) 309 lb (140.2 kg)  Height: 5\' 7"  (1.702 m)   Body mass index is 48.4 kg/m.   Wt Readings from Last 3 Encounters:  08/10/21 (!) 309 lb (140.2 kg)  06/28/21 (!) 315 lb (142.9 kg)  06/03/21 (!) 317 lb 12.8 oz (144.2 kg)     Ht Readings from Last 3 Encounters:  08/10/21 5\' 7"  (1.702 m)  06/28/21 5\' 7"  (1.702 m)  05/10/21 5\' 7"  (1.702 m)      General: The patient is awake, alert and appears not in acute distress. The patient is well groomed. Head: Normocephalic, atraumatic. Neck is supple.  Mallampati 3 neck circumference:16.25 inches . Nasal airflow  patent.  Retrognathia is  seen.  Dental status: biological  Cardiovascular:  Regular rate and cardiac rhythm by pulse,  without distended neck veins. Respiratory: Lungs are clear to auscultation.  Skin:  Without evidence of ankle edema, or rash. Trunk: The patient's posture is erect.   Neurologic exam : The patient is awake and alert, oriented to place and time.   Memory subjective described as intact.  Attention span & concentration ability appears normal.  Speech is fluent, without dysarthria, dysphonia or aphasia.  Mood and affect are appropriate.   Cranial nerves: no loss of smell or taste reported  Pupils are equal and briskly reactive to light.  Funduscopic exam deferred.  Extraocular movements in vertical and horizontal planes were intact and without nystagmus. No Diplopia. Visual fields by finger perimetry are intact. Hearing was intact to soft voice and finger rubbing.    Facial sensation intact to fine touch.  Facial motor strength is symmetric and tongue and uvula move midline.  Neck ROM : rotation, tilt and flexion extension were normal for age and shoulder shrug was symmetrical.    Motor exam:  Symmetric bulk, tone and ROM.  reports stiffness and joint pain  Normal tone without cog-wheeling, symmetric grip strength .   Sensory:  Fine touch and vibration were tested  and  normal.  Proprioception tested in the upper extremities was normal.   Coordination: Rapid alternating movements in the fingers/hands were of normal speed.  The Finger-to-nose maneuver was intact without evidence of ataxia, dysmetria or tremor.   Gait and station: Patient could rise unassisted from a seated position, walked without assistive device.  Stance is of  wide base . Toe and heel walk were deferred.  Deep tendon reflexes: in the  upper and lower extremities are symmetric and intact.  Babinski response was deferred.     After spending a total time of  40  minutes face to face and additional time for physical and neurologic examination, review of laboratory studies,  personal review of imaging studies, reports and results of other testing and review of referral information / records as far as provided in visit, I have established the following assessments:  1)  hypersomnia in daytime but also cyclic sleep disorder.  Insomnia for sleep initiation. Bipolar disorder?  2) joint pain and fatigue , morbid obesity.  3) snoring , son witnessed apnea.    My Plan  is to proceed with:  1) HST  2)  improve sleep hygiene-  3) please follow your diet.   I would like to thank Stevphen RochesterManfredi, Brenda L, MD and Thomasene Lotpalski, Deborah, Do [address] [place] [id number] for allowing me to meet with and to take care of this pleasant patient.   Deborah Little,  Will  follow up through our NP within 2-4  months.   CC: I will share my notes with PCP.  Electronically signed by: Melvyn Novasarmen Orilla Templeman, MD 08/10/2021 10:27 AM  [facility] and [facility] certified by The [employer] Board of Sleep Medicine and Diplomate of the [employer] Academy of Sleep Medicine. Board certified In Neurology through the ABPN, Fellow of the [employer] Academy of Neurology. Medical Director of [facility].

## 2021-08-31 ENCOUNTER — Ambulatory Visit (INDEPENDENT_AMBULATORY_CARE_PROVIDER_SITE_OTHER): Payer: 59 | Admitting: Neurology

## 2021-08-31 DIAGNOSIS — G4710 Hypersomnia, unspecified: Secondary | ICD-10-CM

## 2021-08-31 DIAGNOSIS — G4730 Sleep apnea, unspecified: Secondary | ICD-10-CM

## 2021-08-31 DIAGNOSIS — G4719 Other hypersomnia: Secondary | ICD-10-CM

## 2021-08-31 DIAGNOSIS — G478 Other sleep disorders: Secondary | ICD-10-CM

## 2021-08-31 DIAGNOSIS — G4733 Obstructive sleep apnea (adult) (pediatric): Secondary | ICD-10-CM | POA: Diagnosis not present

## 2021-09-05 NOTE — Addendum Note (Signed)
Addended by: Melvyn Novas on: 09/05/2021 06:43 PM ? ? Modules accepted: Orders ? ?

## 2021-09-05 NOTE — Progress Notes (Signed)
? ?  ?  ?[facility] ?  ?HOME SLEEP TEST REPORT ( by Watch PAT)   ?STUDY DATE:  08-31-2021 ?  ?ORDERING CLINICIAN: Carmen Dohmeier, MD  ?REFERRING CLINICIAN:  Dr. Opalski, DO at [facility] ?  ?CLINICAL INFORMATION/HISTORY: Deborah Little is a 36 y.o. AAF who was seen on 08-09-2021 in Consultation , requested by her physician at [facility].  ?Pt has noticed extreme daytime sleepiness. Vitamin D is low. Deals with depression. Some slight snoring but not loud. Was seen by her dentist and he suggested apnea work up.   ? Deborah Little has a  medical history of Anemia, Anxiety, Bilateral swelling of feet and ankles, Biliary dyskinesia (02/2018), Cholecystectomy, Depression, Fatty liver, Hyperlipidemia, generalized Joint pain, Morbid Obesity, Osteoarthritis, Prediabetes, Shortness of breath on exertion, Swallowing difficulty, and Vitamin D deficiency. ?Mother has insomnia.  ? ?Epworth sleepiness score:  18/24 /24. ?  ?BMI: 48.4 kg/m? ?  ?Neck Circumference: 16" ?  ?FINDINGS: ?  ?Sleep Summary: ?  ?Total Recording Time (hours, min): Total recording time amounted to 8 hours and 49 minutes of which the total sleep time were calculated at 7 hours and 23 minutes. ?  REM sleep time was 26.4% of total recorded sleep time.     ?  ?Respiratory Indices: ?  ?Calculated pAHI (per hour):    Overall apnea-hypopnea index was 3.4/h during REM sleep this exacerbated to 8.9/h and in non-REM sleep the AHI was 1.3/h.                       ?  ?Positional AHI: The apnea hypopnea index or AHI was the highest in supine sleep position.  The patient slept approximately 200 minutes in supine with an AHI of 5.7/h.  Followed by 137 minutes in right-sided sleep with an AHI of 1.4/h. ? ?Snoring level reached a mean volume of 41 dB and was present for about 25% of total sleep time.`                                               ?  ?Oxygen Saturation Statistics:      ?  ?O2 Saturation Range (%):    Range between a  nadir of 90 and a maximum of 100% with a mean oxygenation at 96%.                                 ?  ?O2 Saturation (minutes) <89%:     0 minutes    ?  ?Pulse Rate Statistics: ?  ?Pulse Mean (bpm): 80 bpm              ?  ?Pulse Range:   Between 60 and 113 bpm            ?  ?IMPRESSION:  This HST does not indicate that sleep apnea is present, the overall AHI is below 5/h.  This patient's excessively daytime sleepiness appears also to have cyclic insomnia and a high degree of fatigue.   ?RECOMMENDATION: This home sleep test only showed isolated apneic events and these could be reduced by avoiding sleeping in supine position.  ?Weight loss will definitely help to reduce snoring and apnea.   ? ?The concern about cyclic insomnia   and problems with sleep initiation may be related to a mood disorder. A psychological evaluation should be considered, as well as improvement of sleep hygiene.  ? ?Given the excessive daytime sleepiness as reported , I would consider modafinil for this patient to allow safe driving. ?Typical signs and symptoms of narcolepsy were not endorsed: The patient reports no sleep paralysis, no frequent vivid dreams or hypnagogic/ hypnopompic hallucinations, and no cataplexy. ? ?INTERPRETING PHYSICIAN: ?Carmen Dohmeier, MD  ?Medical Director of [facility].  ? ? ? ? ? ? ? ? ? ? ? ? ? ? ? ?

## 2021-09-05 NOTE — Progress Notes (Signed)
IMPRESSION:  This HST does not indicate that sleep apnea is present, the overall AHI is below 5/h.  This patient's excessively daytime sleepiness appears also to have cyclic insomnia and a high degree of fatigue.   ?RECOMMENDATION: This home sleep test only showed isolated apneic events and these could be reduced by avoiding sleeping in supine position.  ?Weight loss will definitely help to reduce snoring and apnea.   ?? ?The concern about cyclic insomnia and problems with sleep initiation may be related to a mood disorder. A psychological evaluation should be considered, as well as improvement of sleep hygiene.  ?? ?Given the excessive daytime sleepiness as reported , I would consider modafinil for this patient to allow safe driving. For this to be covered by insurance , a patient has to have a short mean sleep latency and/or failed stimulant medication. Marland Kitchen  ?Typical signs and symptoms of narcolepsy were not endorsed: The patient reports no sleep paralysis, no frequent vivid dreams or hypnagogic/ hypnopompic hallucinations, and no cataplexy. ?? ?INTERPRETING PHYSICIAN: ?Larey Seat, MD  ?Medical Director of [employer] at [employer].

## 2021-09-05 NOTE — Procedures (Signed)
? ?  ?  ?[facility] ?  ?HOME SLEEP TEST REPORT ( by [facility])   ?STUDY DATE:  08-31-2021 ?  ?ORDERING CLINICIAN: Melvyn Novas, MD  ?REFERRING CLINICIAN:  Dr. Sharee Holster, DO at [facility] ?  ?CLINICAL INFORMATION/HISTORY: Deborah Little is a 36 y.o. AAF who was seen on 08-09-2021 in Consultation , requested by her physician at [facility].  ?Pt has noticed extreme daytime sleepiness. Vitamin D is low. Deals with depression. Some slight snoring but not loud. Was seen by her dentist and he suggested apnea work up.   ? Deborah Little has a  medical history of Anemia, Anxiety, Bilateral swelling of feet and ankles, Biliary dyskinesia (02/2018), Cholecystectomy, Depression, Fatty liver, Hyperlipidemia, generalized Joint pain, Morbid Obesity, Osteoarthritis, Prediabetes, Shortness of breath on exertion, Swallowing difficulty, and Vitamin D deficiency. ?Mother has insomnia.  ? ?Epworth sleepiness score:  18/24 /24. ?  ?BMI: 48.4 kg/m? ?  ?Neck Circumference: 16" ?  ?FINDINGS: ?  ?Sleep Summary: ?  ?Total Recording Time (hours, min): Total recording time amounted to 8 hours and 49 minutes of which the total sleep time were calculated at 7 hours and 23 minutes. ?  REM sleep time was 26.4% of total recorded sleep time.     ?  ?Respiratory Indices: ?  ?Calculated pAHI (per hour):    Overall apnea-hypopnea index was 3.4/h during REM sleep this exacerbated to 8.9/h and in non-REM sleep the AHI was 1.3/h.                       ?  ?Positional AHI: The apnea hypopnea index or AHI was the highest in supine sleep position.  The patient slept approximately 200 minutes in supine with an AHI of 5.7/h.  Followed by 137 minutes in right-sided sleep with an AHI of 1.4/h. ? ?Snoring level reached a mean volume of 41 dB and was present for about 25% of total sleep time.`                                               ?  ?Oxygen Saturation Statistics:      ?  ?O2 Saturation Range (%):    Range between a  nadir of 90 and a maximum of 100% with a mean oxygenation at 96%.                                 ?  ?O2 Saturation (minutes) <89%:     0 minutes    ?  ?Pulse Rate Statistics: ?  ?Pulse Mean (bpm): 80 bpm              ?  ?Pulse Range:   Between 60 and 113 bpm            ?  ?IMPRESSION:  This HST does not indicate that sleep apnea is present, the overall AHI is below 5/h.  This patient's excessively daytime sleepiness appears also to have cyclic insomnia and a high degree of fatigue.   ?RECOMMENDATION: This home sleep test only showed isolated apneic events and these could be reduced by avoiding sleeping in supine position.  ?Weight loss will definitely help to reduce snoring and apnea.   ? ?The concern about cyclic insomnia  and problems with sleep initiation may be related to a mood disorder. A psychological evaluation should be considered, as well as improvement of sleep hygiene.  ? ?Given the excessive daytime sleepiness as reported , I would consider modafinil for this patient to allow safe driving. ?Typical signs and symptoms of narcolepsy were not endorsed: The patient reports no sleep paralysis, no frequent vivid dreams or hypnagogic/ hypnopompic hallucinations, and no cataplexy. ? ?INTERPRETING PHYSICIAN: ?Melvyn Novas, MD  ?Medical Director of [facility] at [employer].  ? ? ? ? ? ? ? ? ? ? ? ? ? ? ? ?

## 2021-09-06 ENCOUNTER — Telehealth: Payer: Self-pay | Admitting: *Deleted

## 2021-09-06 ENCOUNTER — Other Ambulatory Visit: Payer: Self-pay | Admitting: *Deleted

## 2021-09-06 DIAGNOSIS — G4719 Other hypersomnia: Secondary | ICD-10-CM

## 2021-09-06 MED ORDER — MODAFINIL 200 MG PO TABS: 100.0000 mg | ORAL_TABLET | Freq: Every day | ORAL | 2 refills | Status: DC

## 2021-09-06 NOTE — Telephone Encounter (Signed)
-----   Message from Melvyn Novas, MD sent at 09/05/2021  6:43 PM EDT ----- ?IMPRESSION:  This HST does not indicate that sleep apnea is present, the overall AHI is below 5/h.  This patient's excessively daytime sleepiness appears also to have cyclic insomnia and a high degree of fatigue.   ?RECOMMENDATION: This home sleep test only showed isolated apneic events and these could be reduced by avoiding sleeping in supine position.  ?Weight loss will definitely help to reduce snoring and apnea.   ?? ?The concern about cyclic insomnia and problems with sleep initiation may be related to a mood disorder. A psychological evaluation should be considered, as well as improvement of sleep hygiene.  ?? ?Given the excessive daytime sleepiness as reported , I would consider modafinil for this patient to allow safe driving. For this to be covered by insurance , a patient has to have a short mean sleep latency and/or failed stimulant medication. Marland Kitchen  ?Typical signs and symptoms of narcolepsy were not endorsed: The patient reports no sleep paralysis, no frequent vivid dreams or hypnagogic/ hypnopompic hallucinations, and no cataplexy. ?? ?INTERPRETING PHYSICIAN: ?Melvyn Novas, MD  ?Medical Director of [facility] at [employer].  ?

## 2021-09-06 NOTE — Telephone Encounter (Signed)
Called and spoke with pt. Relayed results per Dr. Oliva Bustard note. She verbalized understanding. She agreed to MSLT. I explained how this test will be done. Aware sleep lab will reach out once authorized via insurance. Confirmed med list, not on any stimulants. She asked about med she can take for excessive sleepiness. Relayed Dr. Oliva Bustard recommendation of modafinil 200mg  (1/2 tab po q am). Made her aware insurance will not cover, will have to pay out of pocket (can use goodrx coupon) to try medication. Pt agreeable to plan. I sent to MD to e-scribe to [facility].  ? ? ?

## 2021-10-31 ENCOUNTER — Ambulatory Visit: Payer: 59

## 2021-10-31 ENCOUNTER — Ambulatory Visit (INDEPENDENT_AMBULATORY_CARE_PROVIDER_SITE_OTHER): Payer: 59 | Admitting: *Deleted

## 2021-10-31 ENCOUNTER — Other Ambulatory Visit (HOSPITAL_COMMUNITY)
Admission: RE | Admit: 2021-10-31 | Discharge: 2021-10-31 | Disposition: A | Discharge status: Home / Self Care | Payer: 59 | Source: Ambulatory Visit | Attending: Family Medicine | Admitting: Family Medicine

## 2021-10-31 VITALS — BP 130/89 | HR 86 | Ht 67.0 in | Wt 310.4 lb

## 2021-10-31 DIAGNOSIS — N898 Other specified noninflammatory disorders of vagina: Secondary | ICD-10-CM | POA: Diagnosis present

## 2021-10-31 DIAGNOSIS — Z202 Contact with and (suspected) exposure to infections with a predominantly sexual mode of transmission: Secondary | ICD-10-CM

## 2021-10-31 NOTE — Progress Notes (Addendum)
Here for nurse visit. C/o vaginal itching  and thinks she has normal discharge. States doesn't think it is yeast, wants to be checked for  self swab for std's , bv, yeast. Self swab obtained. Patient also wants to get blood tests for HIV, RPR, hepatitis B because she is worried she may have been exposed. Notified will be notified results per Mychart and contacted if needs treatment. She voices understanding. ?Nancy Fetter ?

## 2021-10-31 NOTE — Progress Notes (Signed)
Patient was assessed and managed by nursing staff during this encounter. I have reviewed the chart and agree with the documentation and plan. I have also made any necessary editorial changes. ? ?Brent Taillon A Marlise Fahr, MD ?10/31/2021 1:05 PM   ?

## 2021-11-01 LAB — CERVICOVAGINAL ANCILLARY ONLY
Bacterial Vaginitis (gardnerella): POSITIVE — AB
Candida Glabrata: NEGATIVE
Candida Vaginitis: NEGATIVE
Chlamydia: NEGATIVE
Comment: NEGATIVE
Comment: NEGATIVE
Comment: NEGATIVE
Comment: NEGATIVE
Comment: NEGATIVE
Comment: NORMAL
Neisseria Gonorrhea: NEGATIVE
Trichomonas: NEGATIVE

## 2021-11-01 LAB — HEPATITIS B SURFACE ANTIGEN: Hepatitis B Surface Ag: NEGATIVE

## 2021-11-01 LAB — HIV ANTIBODY (ROUTINE TESTING W REFLEX): HIV Screen 4th Generation wRfx: NONREACTIVE

## 2021-11-01 LAB — RPR: RPR Ser Ql: NONREACTIVE

## 2021-11-02 ENCOUNTER — Telehealth: Payer: Self-pay | Admitting: Family Medicine

## 2021-11-02 MED ORDER — TINIDAZOLE 500 MG PO TABS: 2.0000 g | ORAL_TABLET | Freq: Every day | ORAL | 0 refills | Status: AC

## 2021-11-02 NOTE — Telephone Encounter (Signed)
Called pt and pt asked if she needed tx for the BV.  I verified with pt if she is having sx's.  Pt stated "yes".  Pt states that the Flagyl is hard to swallow.  I informed pt per protocol we can order Tindamax 2 grams po for two days.  Pt verbalized understanding.  ? ?Jekhi Bolin,RN  ?11/02/21 ?

## 2021-11-02 NOTE — Telephone Encounter (Signed)
Patient called in wanting to speak with a nurse about her test results.  ?

## 2021-11-10 ENCOUNTER — Telehealth: Payer: Self-pay | Admitting: Lactation Services

## 2021-11-10 NOTE — Telephone Encounter (Signed)
Called and spoke with patient. She reports Medication has already been called in and she has no questions or concerns at this time.

## 2021-11-10 NOTE — Telephone Encounter (Signed)
-----   Message from Warden Fillers, MD sent at 11/07/2021  5:50 PM EDT ----- BV noted on swab, will offer treatment

## 2022-01-17 ENCOUNTER — Telehealth: Payer: Self-pay

## 2022-01-17 NOTE — Telephone Encounter (Signed)
LVM for pt to call back to let sleep lab know if she would like to proceed with sleep studies.   

## 2022-01-25 ENCOUNTER — Encounter (INDEPENDENT_AMBULATORY_CARE_PROVIDER_SITE_OTHER): Payer: Self-pay

## 2022-01-25 NOTE — Telephone Encounter (Signed)
We have attempted to call the patient two times to schedule sleep study.  Patient has been unavailable at the phone numbers we have on file and has not returned our calls. If patient calls back we will schedule them for their sleep study.  

## 2022-04-26 ENCOUNTER — Telehealth: Payer: Self-pay | Admitting: Family Medicine

## 2022-04-26 NOTE — Telephone Encounter (Signed)
Patient called in stating she is having pressure down there and her labia's are swollen. She also said her veins on labia's are swollen or twisted looking. She said she has not had any sexual intercourse lately. She said yesterday a nurse sent her a link to do a virtual appointment but she does not think that is what she needs so she wants to speak with a nurse about it.

## 2022-04-27 ENCOUNTER — Ambulatory Visit (INDEPENDENT_AMBULATORY_CARE_PROVIDER_SITE_OTHER): Payer: 59 | Admitting: Obstetrics and Gynecology

## 2022-04-27 ENCOUNTER — Encounter: Payer: Self-pay | Admitting: Obstetrics and Gynecology

## 2022-04-27 VITALS — BP 134/82 | HR 108 | Resp 16 | Ht 67.0 in | Wt 313.9 lb

## 2022-04-27 DIAGNOSIS — S3141XA Laceration without foreign body of vagina and vulva, initial encounter: Secondary | ICD-10-CM

## 2022-04-27 NOTE — Telephone Encounter (Signed)
Called patient and she reports feeling increasing pressure in her labia over the past few days. She states it feels swollen and it looks like one side is twisted almost like a hemorrhoid but its on her labia. Patient states the pressure is uncomfortable with sitting or walking. She is worried something could go wrong and doesn't want something to go unnoticed as she has a history of cancer in her family. Patient states she works across the street from [place] and could be in and out really quick. Offered appt if she could come right now. Patient verbalized understanding and states she will head over.

## 2022-04-27 NOTE — Patient Instructions (Addendum)
We got a culture from that are.   Can use aquaphor for now.

## 2022-04-27 NOTE — Progress Notes (Signed)
    GYNECOLOGY PROGRESS NOTE  Subjective:    Patient ID: Deborah Little, female    DOB: 1985-11-04, 36 y.o.   MRN: [id number]  HPI  Patient is a 36 y.o. G7P1001 female who presents for evaluation of labia. She has concerns that her labia looks twisted. It looks like a hemorrhoid. She had on underwear that was two small once and thinks that may have caused it, but she is unsure.she typically will have to move the excess tissue around when she puts on underwear. Does not bother her when she has intercourse. She has never had anything like this happen before. She reports that the left side has been getting larger over time; feel it has looked different since delivery of her last child.   The following portions of the patient's history were reviewed and updated as appropriate: allergies, current medications, past family history, past medical history, past social history, past surgical history, and problem list.  Review of Systems Pertinent items are noted in HPI.   Objective:   Blood pressure 134/82, pulse (!) 108, resp. rate 16, height 5\' 7"  (1.702 m), weight (!) 313 lb 14.4 oz (142.4 kg), last menstrual period 04/26/2022. Body mass index is 49.16 kg/m. General appearance: alert and cooperative Abdomen: soft, non-tender; bowel sounds normal; no masses,  no organomegaly Pelvic: Normal labia majora bilaterally Normal right labia minora Left labia minora with excess tissue ~5cm in length and 4cm wide, distal end edematous with small wound between folds   Assessment:   1. Tear of labium, initial encounter      Plan:   1. Tear of labium, initial encounter Culture collected from torn skin area. Discussed that skin likely tore from underwear that was too small. Given size and possible propensity to get caught in clothing, discussed possibility of labiaplasty. Reviewed that labiaplasty for function more than aesthetic and there is risk of wound breakdown. Will review case with partners for  potential surgical planning - Anaerobic and Aerobic Culture  13/01/2022, MD [facility]

## 2022-05-02 LAB — ANAEROBIC AND AEROBIC CULTURE

## 2022-05-29 ENCOUNTER — Ambulatory Visit: Payer: 59 | Admitting: Obstetrics and Gynecology

## 2022-06-20 ENCOUNTER — Institutional Professional Consult (permissible substitution) (HOSPITAL_BASED_OUTPATIENT_CLINIC_OR_DEPARTMENT_OTHER): Payer: 59 | Admitting: Obstetrics & Gynecology

## 2022-06-21 ENCOUNTER — Ambulatory Visit (INDEPENDENT_AMBULATORY_CARE_PROVIDER_SITE_OTHER): Payer: 59 | Admitting: Obstetrics and Gynecology

## 2022-06-21 ENCOUNTER — Encounter: Payer: Self-pay | Admitting: Obstetrics and Gynecology

## 2022-06-21 ENCOUNTER — Other Ambulatory Visit: Payer: Self-pay

## 2022-06-21 ENCOUNTER — Other Ambulatory Visit (HOSPITAL_COMMUNITY)
Admission: RE | Admit: 2022-06-21 | Discharge: 2022-06-21 | Disposition: A | Discharge status: Home / Self Care | Payer: 59 | Source: Ambulatory Visit | Attending: Obstetrics and Gynecology | Admitting: Obstetrics and Gynecology

## 2022-06-21 VITALS — BP 131/83 | HR 116 | Wt 311.1 lb

## 2022-06-21 DIAGNOSIS — B9689 Other specified bacterial agents as the cause of diseases classified elsewhere: Secondary | ICD-10-CM | POA: Diagnosis present

## 2022-06-21 DIAGNOSIS — N760 Acute vaginitis: Secondary | ICD-10-CM

## 2022-06-21 NOTE — Progress Notes (Signed)
GYNECOLOGY VISIT  Patient name: Deborah Little MRN [id number]  Date of birth: Oct 11, 1985 Chief Complaint:   Bacterial Vaginosis   History:  Deborah Little is a 37 y.o. G1P1001 being seen today for recurrent BV. Will have a bit of an odor and itching .  Took IUD out previously and now having heavy menses. Mom may use tide or bleach. No tnecesarily realted to the menses. Sexually active with same partner as previous . Increased discharge and itching. Typically will take flagyl and it gets rid of it  Gel caused yeast infeciton. Has not tried boric acid. Mother uses Tide and she uses free and clear. Same partner for at least 3 years. Not related to menses   Past Medical History:  Diagnosis Date   Anemia    Anxiety    Bilateral swelling of feet and ankles    Biliary dyskinesia 02/2018   Chest pain    Depression    Fatty liver    Fish allergy    Gallbladder problem    Hyperlipidemia    Joint pain    Obesity    Osteoarthritis    Other fatigue    Prediabetes    Shortness of breath    Shortness of breath on exertion    Sleep apnea    Swallowing difficulty    Vitamin D deficiency     Past Surgical History:  Procedure Laterality Date   CHOLECYSTECTOMY N/A 03/13/2018   Procedure: LAPAROSCOPIC CHOLECYSTECTOMY;  Surgeon: Coralie Keens, MD;  Location: [place] [place];  Service: General;  Laterality: N/A;   GALLBLADDER SURGERY  2018   WISDOM TOOTH EXTRACTION      The following portions of the patient's history were reviewed and updated as appropriate: allergies, current medications, past family history, past medical history, past social history, past surgical history and problem list.    Review of Systems:  Pertinent items are noted in HPI. Comprehensive review of systems was otherwise negative.   Objective:  Physical Exam BP 131/83   Pulse (!) 116   Wt (!) 311 lb 1.1 oz (141.1 kg)   LMP 05/26/2022   BMI 48.72 kg/m    Physical Exam Vitals and nursing  note reviewed. Exam conducted with a chaperone present.  Constitutional:      Appearance: Normal appearance.  HENT:     Head: Normocephalic and atraumatic.  Cardiovascular:     Rate and Rhythm: Normal rate and regular rhythm.  Pulmonary:     Effort: Pulmonary effort is normal.     Breath sounds: Normal breath sounds.  Genitourinary:    Exam position: Lithotomy position.     Comments: Discharge noted Hypertrophy of left labia minora  Skin:    General: Skin is warm and dry.  Neurological:     General: No focal deficit present.     Mental Status: She is alert.  Psychiatric:        Mood and Affect: Mood normal.        Behavior: Behavior normal.        Thought Content: Thought content normal.        Judgment: Judgment normal.     Labs and Imaging No results found.     Assessment & Plan:   1. BV (bacterial vaginosis) Reported recurrent infection - swab collected today and will treat accordingly. Discussed if BV today, treat with po flagyl and can do boric acid thereafter. Take note of triggers of discharge so they can be avoided.  -  Cervicovaginal ancillary only( [place])   Routine preventative health maintenance measures emphasized.  Darliss Cheney, MD [facility] [place]

## 2022-06-22 ENCOUNTER — Other Ambulatory Visit: Payer: Self-pay | Admitting: Obstetrics and Gynecology

## 2022-06-22 DIAGNOSIS — A599 Trichomoniasis, unspecified: Secondary | ICD-10-CM

## 2022-06-22 DIAGNOSIS — B9689 Other specified bacterial agents as the cause of diseases classified elsewhere: Secondary | ICD-10-CM

## 2022-06-22 LAB — CERVICOVAGINAL ANCILLARY ONLY
Bacterial Vaginitis (gardnerella): POSITIVE — AB
Candida Glabrata: NEGATIVE
Candida Vaginitis: NEGATIVE
Chlamydia: NEGATIVE
Comment: NEGATIVE
Comment: NEGATIVE
Comment: NEGATIVE
Comment: NEGATIVE
Comment: NEGATIVE
Comment: NORMAL
Neisseria Gonorrhea: NEGATIVE
Trichomonas: POSITIVE — AB

## 2022-06-22 MED ORDER — METRONIDAZOLE 500 MG PO TABS: 500.0000 mg | ORAL_TABLET | Freq: Two times a day (BID) | ORAL | 0 refills | Status: AC

## 2022-09-20 ENCOUNTER — Encounter (HOSPITAL_COMMUNITY): Payer: Self-pay | Admitting: Obstetrics and Gynecology

## 2022-09-20 ENCOUNTER — Inpatient Hospital Stay (HOSPITAL_COMMUNITY)
Admission: AD | Admit: 2022-09-20 | Discharge: 2022-09-20 | Disposition: A | Discharge status: Home / Self Care | Payer: 59 | Attending: Obstetrics and Gynecology | Admitting: Obstetrics and Gynecology

## 2022-09-20 DIAGNOSIS — N939 Abnormal uterine and vaginal bleeding, unspecified: Secondary | ICD-10-CM | POA: Insufficient documentation

## 2022-09-20 DIAGNOSIS — Z3202 Encounter for pregnancy test, result negative: Secondary | ICD-10-CM | POA: Insufficient documentation

## 2022-09-20 LAB — CBC
HCT: 32.1 % — ABNORMAL LOW (ref 36.0–46.0)
Hemoglobin: 9.6 g/dL — ABNORMAL LOW (ref 12.0–15.0)
MCH: 24.9 pg — ABNORMAL LOW (ref 26.0–34.0)
MCHC: 29.9 g/dL — ABNORMAL LOW (ref 30.0–36.0)
MCV: 83.2 fL (ref 80.0–100.0)
Platelets: 342 10*3/uL (ref 150–400)
RBC: 3.86 MIL/uL — ABNORMAL LOW (ref 3.87–5.11)
RDW: 15.4 % (ref 11.5–15.5)
WBC: 8.1 10*3/uL (ref 4.0–10.5)
nRBC: 0 % (ref 0.0–0.2)

## 2022-09-20 LAB — POCT PREGNANCY, URINE: Preg Test, Ur: NEGATIVE

## 2022-09-20 MED ORDER — KETOROLAC TROMETHAMINE 30 MG/ML IJ SOLN
30.0000 mg | Freq: Once | INTRAMUSCULAR | Status: DC
  Filled 2022-09-20: qty 1

## 2022-09-20 MED ORDER — MEGESTROL ACETATE 40 MG PO TABS
40.0000 mg | ORAL_TABLET | Freq: Once | ORAL | Status: AC
  Administered 2022-09-20: 40 mg via ORAL
  Filled 2022-09-20: qty 1

## 2022-09-20 MED ORDER — KETOROLAC TROMETHAMINE 30 MG/ML IJ SOLN
30.0000 mg | Freq: Once | INTRAMUSCULAR | Status: AC
  Administered 2022-09-20: 30 mg via INTRAMUSCULAR

## 2022-09-20 MED ORDER — MEGESTROL ACETATE 40 MG PO TABS: 40.0000 mg | ORAL_TABLET | Freq: Two times a day (BID) | ORAL | 0 refills | Status: DC

## 2022-09-20 NOTE — MAU Note (Signed)
..  Deborah Little is a 37 y.o. here in MAU reporting: vaginal bleeding that began today around 11am, has had large clots and has had to change her pad and clothes non stop. Reports that this is unusual as she does not bleeding this much with a period.  Has some abdominal cramping that hurts when she is passing a clot that began yesterday.  Has not had a positive pregnancy test LMP: 08/26/2022 Onset of complaint: today Pain score: 9/10 at its worst, no pain now Vitals:   09/20/22 2002  BP: 129/83  Pulse: 88  Resp: 18  Temp: 99.2 F (37.3 C)  SpO2: 100%     FHT:not indicated Lab orders placed from triage:

## 2022-09-20 NOTE — MAU Provider Note (Signed)
Event Date/Time   First Provider Initiated Contact with Patient 09/20/22 2007      S [name] is a 37 y.o. G1P1001 patient who presents to MAU today with complaint of vaginal bleeding. She reports she had her IUD removed a year ago and her periods have been slightly heavier. She states this bleeding is the heaviest she has ever had. She has bled through her clothes several times today. She reports lower abdominal cramping when she passes clots but none now. She was worried this was pregnancy bleeding.   Review of Systems  Constitutional: Negative.  Negative for chills and fever.  Respiratory: Negative.  Negative for cough and shortness of breath.   Cardiovascular: Negative.  Negative for chest pain.  Gastrointestinal:  Negative for constipation, diarrhea, nausea and vomiting.  Genitourinary: Negative.  Negative for dysuria and urgency.       Vaginal bleeding  Neurological: Negative.  Negative for dizziness and headaches.  Psychiatric/Behavioral:  Negative for depression, substance abuse and suicidal ideas.      O BP 129/83 (BP Location: Right Arm)   Pulse 88   Temp 99.2 F (37.3 C) (Oral)   Resp 18   Ht 5\' 7"  (1.702 m)   Wt (!) 142.2 kg   SpO2 100%   BMI 49.12 kg/m  Physical Exam Vitals and nursing note reviewed.  Constitutional:      General: She is not in acute distress.    Appearance: She is well-developed.  HENT:     Head: Normocephalic.  Eyes:     Pupils: Pupils are equal, round, and reactive to light.  Cardiovascular:     Rate and Rhythm: Normal rate and regular rhythm.     Heart sounds: Normal heart sounds.  Pulmonary:     Effort: Pulmonary effort is normal. No respiratory distress.     Breath sounds: Normal breath sounds.  Abdominal:     General: Bowel sounds are normal. There is no distension.     Palpations: Abdomen is soft.     Tenderness: There is no abdominal tenderness.  Genitourinary:    Vagina: Bleeding present.  Skin:    General: Skin is  warm and dry.  Neurological:     Mental Status: She is alert and oriented to person, place, and time.  Psychiatric:        Mood and Affect: Mood normal.        Behavior: Behavior normal.        Thought Content: Thought content normal.        Judgment: Judgment normal.    Results for orders placed or performed during the hospital encounter of 09/20/22 (from the past 24 hour(s))  Pregnancy, urine POC     Status: None   Collection Time: 09/20/22  7:44 PM  Result Value Ref Range   Preg Test, Ur NEGATIVE NEGATIVE  CBC     Status: Abnormal   Collection Time: 09/20/22  8:41 PM  Result Value Ref Range   WBC 8.1 4.0 - 10.5 K/uL   RBC 3.86 (L) 3.87 - 5.11 MIL/uL   Hemoglobin 9.6 (L) 12.0 - 15.0 g/dL   HCT 32.1 (L) 36.0 - 46.0 %   MCV 83.2 80.0 - 100.0 fL   MCH 24.9 (L) 26.0 - 34.0 pg   MCHC 29.9 (L) 30.0 - 36.0 g/dL   RDW 15.4 11.5 - 15.5 %   Platelets 342 150 - 400 K/uL   nRBC 0.0 0.0 - 0.2 %    A  Medical screening exam complete   CBC Toradol Megace PO  P -Discharge home in stable condition -Rx for megace sent to pharmacy -Vaginal bleeding precautions discussed. Outpatient ultrasound ordered -Patient advised to follow-up with [facility] as scheduled on 4/12 -Patient may return to MAU as needed or if her condition were to change or worsen  Wende Mott, [place] 09/20/2022 8:07 PM

## 2022-09-25 ENCOUNTER — Ambulatory Visit (HOSPITAL_COMMUNITY)
Admission: RE | Admit: 2022-09-25 | Discharge: 2022-09-25 | Disposition: A | Discharge status: Home / Self Care | Payer: 59 | Source: Ambulatory Visit

## 2022-09-25 DIAGNOSIS — N939 Abnormal uterine and vaginal bleeding, unspecified: Secondary | ICD-10-CM | POA: Insufficient documentation

## 2022-09-25 DIAGNOSIS — Z3202 Encounter for pregnancy test, result negative: Secondary | ICD-10-CM | POA: Insufficient documentation

## 2022-09-26 ENCOUNTER — Telehealth: Payer: Self-pay | Admitting: Family Medicine

## 2022-09-26 NOTE — Telephone Encounter (Signed)
Patient concern about her fibroids, would like a call back about.

## 2022-09-27 ENCOUNTER — Ambulatory Visit (INDEPENDENT_AMBULATORY_CARE_PROVIDER_SITE_OTHER): Payer: 59 | Admitting: Obstetrics and Gynecology

## 2022-09-27 ENCOUNTER — Encounter: Payer: Self-pay | Admitting: Obstetrics and Gynecology

## 2022-09-27 VITALS — BP 128/77 | HR 89 | Ht 67.0 in | Wt 314.0 lb

## 2022-09-27 DIAGNOSIS — N939 Abnormal uterine and vaginal bleeding, unspecified: Secondary | ICD-10-CM | POA: Diagnosis not present

## 2022-09-27 NOTE — Patient Instructions (Addendum)
Will need an endometrial biopsy prior to having a hysterectomy.

## 2022-09-27 NOTE — Progress Notes (Signed)
GYNECOLOGY VISIT  Patient name: Deborah Little MRN [id number]  Date of birth: 1986/03/26 Chief Complaint:   Fibroids (Would like to discuss options)  History:  Deborah Little is a 37 y.o. G1P1001 being seen today for AUB-L.  Stopped medication due to bleeding stopped after taking it for a day. [place] Tech tod her she saw "clusters" of fibroids. First time having bleeding that heavy when she went to [facility]U. Had cramping with passing of clots. Prescribed megace and took for one day and then bleeding stopped that night, didn't take any more. Previously had IUD that was removed and told small fibroid and did not have to worry about that and very concerned about her fibroids at this time.   [place] report from 2022 had handwritten "multiple diffuse fibroids @ uterine fundus" and body of report notes "Heterogenous solid lesion is present at the anterior aspect of the fundus measuring 2.6 x 1.8 cm compatible with a fibroid."  Past Medical History:  Diagnosis Date   Anemia    Anxiety    Bilateral swelling of feet and ankles    Biliary dyskinesia 02/2018   Chest pain    Depression    Fatty liver    Fish allergy    Gallbladder problem    Hyperlipidemia    Joint pain    Obesity    Osteoarthritis    Other fatigue    Prediabetes    Shortness of breath    Shortness of breath on exertion    Sleep apnea    Swallowing difficulty    Vitamin D deficiency     Past Surgical History:  Procedure Laterality Date   CHOLECYSTECTOMY N/A 03/13/2018   Procedure: LAPAROSCOPIC CHOLECYSTECTOMY;  Surgeon: Abigail Miyamoto, MD;  Location: [facility];  Service: General;  Laterality: N/A;   GALLBLADDER SURGERY  2018   WISDOM TOOTH EXTRACTION      The following portions of the patient's history were reviewed and updated as appropriate: allergies, current medications, past family history, past medical history, past social history, past surgical history and problem list.   Health Maintenance:   Last  pap     Component Value Date/Time   DIAGPAP  04/20/2021 0836    - Negative for intraepithelial lesion or malignancy (NILM)   DIAGPAP  12/09/2019 1617    - Negative for intraepithelial lesion or malignancy (NILM)   DIAGPAP  10/17/2016 0000    NEGATIVE FOR INTRAEPITHELIAL LESIONS OR MALIGNANCY.   HPVHIGH Negative 04/20/2021 0836   HPVHIGH Negative 12/09/2019 1617   ADEQPAP  04/20/2021 0836    Satisfactory for evaluation; transformation zone component PRESENT.   ADEQPAP  12/09/2019 1617    Satisfactory for evaluation; transformation zone component PRESENT.   ADEQPAP  10/17/2016 0000    Satisfactory for evaluation  endocervical/transformation zone component PRESENT.   Last mammogram: n/a   Review of Systems:  Pertinent items are noted in HPI. Comprehensive review of systems was otherwise negative.   Objective:  Physical Exam BP 128/77   Pulse 89   Ht 5\' 7"  (1.702 m)   Wt (!) 314 lb (142.4 kg)   LMP 09/18/2022   BMI 49.18 kg/m    Physical Exam Vitals and nursing note reviewed.  Constitutional:      Appearance: Normal appearance.  HENT:     Head: Normocephalic and atraumatic.  Pulmonary:     Effort: Pulmonary effort is normal.  Neurological:     General: No focal deficit present.  Mental Status: She is alert.  Psychiatric:        Mood and Affect: Mood normal.        Behavior: Behavior normal.        Thought Content: Thought content normal.        Judgment: Judgment normal.     Labs and Imaging US PELVIC COMPLETE WITH TRANSVAGINAL  Result Date: 09/25/2022 CLINICAL DATA:  Abnormal uterine bleeding EXAM: TRANSABDOMINAL AND TRANSVAGINAL ULTRASOUND OF PELVIS TECHNIQUE: Both transabdominal and transvaginal ultrasound examinations of the pelvis were performed. Transabdominal technique was performed for global imaging of the pelvis including uterus, ovaries, adnexal regions, and pelvic cul-de-sac. It was necessary to proceed with endovaginal exam following the  transabdominal exam to visualize the endometrium. COMPARISON:  Ultrasound abdomen 04/05/2017 FINDINGS: Uterus Measurements: 13.6 x 6.5 x 7.5 cm = volume: 346.1 mL. Multiple uterine fibroids. There is a 4.7 x 4.1 x 4.8 cm intramural fibroid with a submucosal component exerting mass effect on the endometrium within the anterior right uterine fundus. There is a 2.8 x 2.5 x 2.4 cm intramural fibroid within the posterior right uterine body. Endometrium Not well-visualized Right ovary Measurements: 2.1 x 1.6 x 1.9 cm = volume: 3.4 mL. Normal appearance/no adnexal mass. Left ovary Measurements: 2.7 x 2.1 x 2.3 cm = volume: 6.7 mL. Normal appearance/no adnexal mass. Other findings No abnormal free fluid. IMPRESSION: 1. Enlarged fibroid uterus. The larger fibroid exerts mass-effect on the endometrium, potentially causative etiology of bleeding. 2. Endometrium not well visualized or assessed on current exam. Electronically Signed   By: Annia Belt M.D.   On: 09/25/2022 13:38       Assessment & Plan:   1. Abnormal uterine bleeding (AUB) Patient has abnormal uterine bleeding with evidence of fibroids on [place].  Discussed management options for abnormal uterine bleeding including medications, Sonata, endometrial ablation or hysterectomy as definitive surgical management.  Patient most interested in definitive management with hysterectomy. Reviewed would need EMB prior to proceeding to rule out other abnormal endometrial pathology. Patient interested in possible vaginal hysterectomy if able and if labiaplasty can be done at the same as she is being evaluated at [facility] in a few days for that procedure.    Lorriane Shire, MD Minimally Invasive Gynecologic Surgery Center for [facility], [facility]

## 2022-09-27 NOTE — Telephone Encounter (Signed)
Patient seen in office

## 2022-09-29 ENCOUNTER — Ambulatory Visit (INDEPENDENT_AMBULATORY_CARE_PROVIDER_SITE_OTHER): Payer: 59 | Admitting: Obstetrics & Gynecology

## 2022-09-29 ENCOUNTER — Encounter (HOSPITAL_BASED_OUTPATIENT_CLINIC_OR_DEPARTMENT_OTHER): Payer: Self-pay | Admitting: Obstetrics & Gynecology

## 2022-09-29 ENCOUNTER — Other Ambulatory Visit (HOSPITAL_COMMUNITY)
Admission: RE | Admit: 2022-09-29 | Discharge: 2022-09-29 | Disposition: A | Discharge status: Home / Self Care | Payer: 59 | Source: Ambulatory Visit | Attending: Obstetrics & Gynecology | Admitting: Obstetrics & Gynecology

## 2022-09-29 VITALS — BP 131/69 | HR 87 | Ht 67.0 in | Wt 317.4 lb

## 2022-09-29 DIAGNOSIS — D251 Intramural leiomyoma of uterus: Secondary | ICD-10-CM | POA: Diagnosis not present

## 2022-09-29 DIAGNOSIS — N946 Dysmenorrhea, unspecified: Secondary | ICD-10-CM

## 2022-09-29 DIAGNOSIS — Z8619 Personal history of other infectious and parasitic diseases: Secondary | ICD-10-CM | POA: Insufficient documentation

## 2022-09-29 DIAGNOSIS — D250 Submucous leiomyoma of uterus: Secondary | ICD-10-CM

## 2022-09-29 DIAGNOSIS — N921 Excessive and frequent menstruation with irregular cycle: Secondary | ICD-10-CM | POA: Diagnosis not present

## 2022-09-29 DIAGNOSIS — Z87898 Personal history of other specified conditions: Secondary | ICD-10-CM

## 2022-09-29 DIAGNOSIS — N906 Unspecified hypertrophy of vulva: Secondary | ICD-10-CM | POA: Insufficient documentation

## 2022-09-29 DIAGNOSIS — D500 Iron deficiency anemia secondary to blood loss (chronic): Secondary | ICD-10-CM

## 2022-09-29 DIAGNOSIS — Z6841 Body Mass Index (BMI) 40.0 and over, adult: Secondary | ICD-10-CM

## 2022-09-29 LAB — HEMOGLOBIN A1C
Est. average glucose Bld gHb Est-mCnc: 128 mg/dL
Hgb A1c MFr Bld: 6.1 % — ABNORMAL HIGH (ref 4.8–5.6)

## 2022-09-29 NOTE — Progress Notes (Signed)
GYNECOLOGY  VISIT  CC:   consult regarding bleeding, labial enlargement  HPI: 37 y.o. G48P1001 Single Black or African American female here for consult for treatment options of bleeding and asymmetric and enlarged labia.  H/o menorrhagia with cycle in early April being much heavier with cramping and passing large clots.  Was seen in the [facility] on 09/20/2022 as she was concerned this was a possible miscarriage due to amount of bleeding that was present.  UPT was negative.  Hb was 9.6.  She was started on megace and stopped after one dose.    Ultrasound done 09/25/2022 showing uterus measuring 13.6 x 6.5 x 7.5cm with 4.7cm x 4.1 x 4.8 intramural fibroid with submucosal components with mass effect on the endometrium.  There is also an 2.8 x 2.5 x 2.4cm intramural fibroid present as well in the right uterine body.  Endometrium not well visualized.  Ovaries normal.  Images reviewed personally as well.   Pt saw Dr. Briscoe Deutscher on 4/10.  Treatment options discussed.  Treatment with TVH and also treatment of asymmetric enlarged labia discussed.  Pt here for second opinion about options.  [place] RFA, HTA, possible myomectomy and possible IUD placement as well as hysterectomy discussed.  Given BMI of 49, there are some increased surgical risks.  Feel she would really benefit from 50 # weight loss prior to major elective surgery.  She has been to [facility] and just said she couldn't tolerate the program with all the protein.  She is open to other options.   Feel Sononhysterogram with better deliniation of fibroids would be helpful as well.   Pap neg with neg HR HPV 04/20/2021.    H/o +trich 06/21/2022.  Has not had TOC.  Was treated.  Reports no longer SA since that time.     Past Medical History:  Diagnosis Date   Anemia    Anxiety    Bilateral swelling of feet and ankles    Biliary dyskinesia 02/2018   Chest pain    Depression    Fatty liver    Fish allergy    Gallbladder problem     Hyperlipidemia    Joint pain    Obesity    Osteoarthritis    Other fatigue    Prediabetes    Shortness of breath    Shortness of breath on exertion    Sleep apnea    Swallowing difficulty    Vitamin D deficiency     MEDS:   No current outpatient medications on file prior to visit.   No current facility-administered medications on file prior to visit.    ALLERGIES: Fish allergy, Hydrocodone, and Penicillins  SH:  single, non smoker  ROS  PHYSICAL EXAMINATION:    BP 131/69 (BP Location: Left Arm, Patient Position: Sitting, Cuff Size: Large)   Pulse 87   Ht  (1.702 m) Comment: Reported  Wt (!) 317 lb 6.4 oz (144 kg)   LMP 09/18/2022   BMI 49.71 kg/m     General appearance: alert, cooperative and appears stated age Lymph:  no inguinal LAD noted  Pelvic: External genitalia:  left labia minora much larger than right, about 6cm where right is about 3cm              Urethra:  normal appearing urethra with no masses, tenderness or lesions              Bartholins and Skenes: normal  Vagina: normal appearing vagina with normal color and discharge, no lesions              Cervix: no lesions              Bimanual Exam:  Uterus:  enlarged, 12  weeks size and mobile              Adnexa: no mass, fullness, tenderness   Chaperone, Ina Homes, CMA, was present for exam.  Assessment/Plan: 1. Menorrhagia with irregular cycle - will have pt return for sonohysterogram to better define uterine fibroids and to determine best option for surgical treatment.  RFA vs myomectomy vs HTA with myomectomy vs hysterectomy all discussed. - pt will need endometrial sampling (in OR at least).  She declines this today.  2. Dysmenorrhea  3. History of trichomoniasis - Cervicovaginal ancillary only( [place])  4. Intramural and submucous leiomyoma of uterus  5. Iron deficiency anemia due to chronic blood loss - iron recommended  6. History of prediabetes - Hemoglobin  A1c  7. Morbid obesity with BMI of 40.0-44.9, adult  8. Labial hypertrophy - partial vulvectomy with labial reduction discussed today as well

## 2022-10-03 LAB — CERVICOVAGINAL ANCILLARY ONLY
Comment: NEGATIVE
Trichomonas: NEGATIVE

## 2022-10-06 ENCOUNTER — Encounter (HOSPITAL_BASED_OUTPATIENT_CLINIC_OR_DEPARTMENT_OTHER): Payer: Self-pay | Admitting: Obstetrics & Gynecology

## 2022-10-18 ENCOUNTER — Other Ambulatory Visit (HOSPITAL_BASED_OUTPATIENT_CLINIC_OR_DEPARTMENT_OTHER): Payer: Self-pay | Admitting: Obstetrics & Gynecology

## 2022-10-18 DIAGNOSIS — N921 Excessive and frequent menstruation with irregular cycle: Secondary | ICD-10-CM

## 2022-11-01 ENCOUNTER — Ambulatory Visit (INDEPENDENT_AMBULATORY_CARE_PROVIDER_SITE_OTHER): Payer: 59

## 2022-11-01 ENCOUNTER — Encounter (HOSPITAL_BASED_OUTPATIENT_CLINIC_OR_DEPARTMENT_OTHER): Payer: Self-pay | Admitting: Obstetrics & Gynecology

## 2022-11-01 ENCOUNTER — Ambulatory Visit (INDEPENDENT_AMBULATORY_CARE_PROVIDER_SITE_OTHER): Payer: 59 | Admitting: Obstetrics & Gynecology

## 2022-11-01 VITALS — BP 135/77 | HR 81 | Ht 67.0 in | Wt 320.6 lb

## 2022-11-01 DIAGNOSIS — D250 Submucous leiomyoma of uterus: Secondary | ICD-10-CM

## 2022-11-01 DIAGNOSIS — N946 Dysmenorrhea, unspecified: Secondary | ICD-10-CM

## 2022-11-01 DIAGNOSIS — D251 Intramural leiomyoma of uterus: Secondary | ICD-10-CM | POA: Diagnosis not present

## 2022-11-01 DIAGNOSIS — D259 Leiomyoma of uterus, unspecified: Secondary | ICD-10-CM

## 2022-11-01 DIAGNOSIS — N921 Excessive and frequent menstruation with irregular cycle: Secondary | ICD-10-CM | POA: Diagnosis not present

## 2022-11-01 NOTE — Patient Instructions (Addendum)
Consult:  interventional radiology [place]. [facility]: [phone]  MRI scheduling [address] [place], [place] [address] Phone [phone]

## 2022-11-06 ENCOUNTER — Encounter (HOSPITAL_BASED_OUTPATIENT_CLINIC_OR_DEPARTMENT_OTHER): Payer: Self-pay | Admitting: Obstetrics & Gynecology

## 2022-11-06 ENCOUNTER — Other Ambulatory Visit (HOSPITAL_BASED_OUTPATIENT_CLINIC_OR_DEPARTMENT_OTHER): Payer: Self-pay | Admitting: Obstetrics & Gynecology

## 2022-11-06 MED ORDER — MEGESTROL ACETATE 40 MG PO TABS: ORAL_TABLET | ORAL | 2 refills | Status: DC

## 2022-11-07 NOTE — Progress Notes (Signed)
GYNECOLOGY  VISIT  CC:   discuss ultrasound findings  HPI: 37 y.o. G34P1001 Single Black or African American female here for discussion of results with sonohysterogram. Multiple fibroids are pushing into the endometrial cavity making an endometrial ablation not a good option for her.  Given the size and number of these fibroids, I feel hysteroscopic resection would only remove a portion of the fibroids and we would be back at the same place in the future as this will not likely cause the fibroids to regress.  We have also discussed uterine artery embolization for treatment and she is very open to this.  Aware will need MRI and IR consultation.  She does understand she shouldn't be pregnancy after [place].  She is comfortable with this given age of daughter and no desires for additional child bearing.  Ready to proceed with scheduling.   Past Medical History:  Diagnosis Date   Anemia    Anxiety    Bilateral swelling of feet and ankles    Biliary dyskinesia 02/2018   Chest pain    Depression    Fatty liver    Fish allergy    Gallbladder problem    Hyperlipidemia    Joint pain    Obesity    Osteoarthritis    Other fatigue    Prediabetes    Shortness of breath    Shortness of breath on exertion    Sleep apnea    Swallowing difficulty    Vitamin D deficiency     MEDS:   No current outpatient medications on file prior to visit.   No current facility-administered medications on file prior to visit.    ALLERGIES: Fish allergy, Hydrocodone, and Penicillins  SH:  single, non smoker  Review of Systems  Constitutional: Negative.   Genitourinary:        Heavy bleeding    PHYSICAL EXAMINATION:    BP 135/77 (BP Location: Right Arm, Patient Position: Sitting, Cuff Size: Large)   Pulse 81   Ht 5\' 7"  (1.702 m) Comment: Reported  Wt (!) 320 lb 9.6 oz (145.4 kg)   BMI 50.21 kg/m     General appearance: alert, cooperative and appears stated age  Pelvic: External genitalia:  no  lesions              Urethra:  normal appearing urethra with no masses, tenderness or lesions              Bartholins and Skenes: normal                 Vagina: normal appearing vagina with normal color and discharge, no lesions              Cervix: no lesions               Chaperone, Alycia Rossetti, was present for exam.  Assessment/Plan: 1. Dysmenorrhea  2. Menorrhagia with irregular cycle  3. Intramural and submucous leiomyoma of uterus - MR PELVIS W WO CONTRAST; Future - Ambulatory referral to Interventional Radiology

## 2022-11-15 ENCOUNTER — Encounter (HOSPITAL_BASED_OUTPATIENT_CLINIC_OR_DEPARTMENT_OTHER): Payer: Self-pay | Admitting: Obstetrics & Gynecology

## 2022-11-15 ENCOUNTER — Other Ambulatory Visit (HOSPITAL_BASED_OUTPATIENT_CLINIC_OR_DEPARTMENT_OTHER): Payer: Self-pay | Admitting: Obstetrics & Gynecology

## 2022-11-15 MED ORDER — TRANEXAMIC ACID 650 MG PO TABS: 1300.0000 mg | ORAL_TABLET | Freq: Three times a day (TID) | ORAL | 2 refills | Status: AC

## 2022-11-16 ENCOUNTER — Other Ambulatory Visit (HOSPITAL_BASED_OUTPATIENT_CLINIC_OR_DEPARTMENT_OTHER): Payer: Self-pay | Admitting: *Deleted

## 2022-11-16 ENCOUNTER — Other Ambulatory Visit (HOSPITAL_BASED_OUTPATIENT_CLINIC_OR_DEPARTMENT_OTHER): Payer: Self-pay | Admitting: Obstetrics & Gynecology

## 2022-11-16 DIAGNOSIS — F321 Major depressive disorder, single episode, moderate: Secondary | ICD-10-CM

## 2022-11-16 NOTE — Addendum Note (Signed)
Addended by: Harrie Jeans on: 11/16/2022 03:48 PM   Modules accepted: Orders

## 2022-11-17 ENCOUNTER — Ambulatory Visit (HOSPITAL_BASED_OUTPATIENT_CLINIC_OR_DEPARTMENT_OTHER)
Admission: RE | Admit: 2022-11-17 | Discharge: 2022-11-17 | Disposition: A | Discharge status: Home / Self Care | Payer: 59 | Source: Ambulatory Visit | Attending: Obstetrics & Gynecology | Admitting: Obstetrics & Gynecology

## 2022-11-17 DIAGNOSIS — D251 Intramural leiomyoma of uterus: Secondary | ICD-10-CM | POA: Insufficient documentation

## 2022-11-17 DIAGNOSIS — N921 Excessive and frequent menstruation with irregular cycle: Secondary | ICD-10-CM | POA: Insufficient documentation

## 2022-11-17 DIAGNOSIS — D250 Submucous leiomyoma of uterus: Secondary | ICD-10-CM | POA: Insufficient documentation

## 2022-11-17 DIAGNOSIS — N946 Dysmenorrhea, unspecified: Secondary | ICD-10-CM | POA: Diagnosis present

## 2022-11-17 MED ORDER — GADOBUTROL 1 MMOL/ML IV SOLN
10.0000 mL | Freq: Once | INTRAVENOUS | Status: AC | PRN
  Administered 2022-11-17: 10 mL via INTRAVENOUS
  Filled 2022-11-17: qty 10

## 2022-11-22 ENCOUNTER — Ambulatory Visit (INDEPENDENT_AMBULATORY_CARE_PROVIDER_SITE_OTHER): Payer: 59 | Admitting: Licensed Clinical Social Worker

## 2022-11-22 ENCOUNTER — Other Ambulatory Visit: Payer: Self-pay | Admitting: Obstetrics & Gynecology

## 2022-11-22 DIAGNOSIS — D250 Submucous leiomyoma of uterus: Secondary | ICD-10-CM

## 2022-11-22 DIAGNOSIS — F4321 Adjustment disorder with depressed mood: Secondary | ICD-10-CM

## 2022-11-23 NOTE — BH Specialist Note (Signed)
Integrated Behavioral Health via Telemedicine Visit  11/23/2022 MARGUERITA STEED [id number]  Number of Integrated Behavioral Health Clinician visits: 1 Session Start time:  10:00am Session End time: 10:34am Total time in minutes: 34 mins via mychart video  Referring Provider: Dr. Hyacinth Meeker Patient/Family location: Home  [facility] Provider location: [facility] All persons participating in visit: Pt M Candela and LCSW A Gaye Scorza  Types of Service: Individual psychotherapy and Video visit  I connected with Willodene S Schreckengost and/or Tucker S Champlain's n/a via  Telephone or Video Enabled Telemedicine Application  (Video is Caregility application) and verified that I am speaking with the correct person using two identifiers. Discussed confidentiality: Yes   I discussed the limitations of telemedicine and the availability of in person appointments.  Discussed there is a possibility of technology failure and discussed alternative modes of communication if that failure occurs.  I discussed that engaging in this telemedicine visit, they consent to the provision of behavioral healthcare and the services will be billed under their insurance.  Patient and/or legal guardian expressed understanding and consented to Telemedicine visit: Yes   Presenting Concerns: Patient and/or family reports the following symptoms/concerns: depressed mood, social isolation, inconsistent sleep pattern,  Duration of problem: over two months; Severity of problem: mild  Patient and/or Family's Strengths/Protective Factors: Concrete supports in place (healthy food, safe environments, etc.)  Goals Addressed: Patient will:  Reduce symptoms of: anxiety and stress   Increase knowledge and/or ability of: coping skills and stress reduction   Demonstrate ability to: Increase healthy adjustment to current life circumstances  Progress towards Goals: Ongoing  Interventions: Interventions utilized:  Supportive Counseling Standardized  Assessments completed: PHQ 9  Patient and/or Family Response: Ms. Steuart reports depressed mood and fatigue is contributed by Abnormal uterine bleeding. Ms. Baribault reports increased social isolation due to current condition  Assessment: Patient currently experiencing adjustment disorder with depressed mood.   Patient may benefit from integrated behavioral health.  Plan: Follow up with behavioral health clinician on : as needed Behavioral recommendations: Collaborate with medical provider to manage AUB. Decrease social isolation when physical comfort increases and prioritize rest.  Referral(s): Integrated [employer] (In Clinic)  I discussed the assessment and treatment plan with the patient and/or parent/guardian. They were provided an opportunity to ask questions and all were answered. They agreed with the plan and demonstrated an understanding of the instructions.   They were advised to call back or seek an in-person evaluation if the symptoms worsen or if the condition fails to improve as anticipated.  Gwyndolyn Saxon, LCSW

## 2022-12-05 ENCOUNTER — Ambulatory Visit
Admission: RE | Admit: 2022-12-05 | Discharge: 2022-12-05 | Disposition: A | Discharge status: Home / Self Care | Payer: 59 | Source: Ambulatory Visit | Attending: Obstetrics & Gynecology | Admitting: Obstetrics & Gynecology

## 2022-12-05 ENCOUNTER — Other Ambulatory Visit (HOSPITAL_COMMUNITY): Payer: Self-pay | Admitting: Interventional Radiology

## 2022-12-05 DIAGNOSIS — D250 Submucous leiomyoma of uterus: Secondary | ICD-10-CM

## 2022-12-05 HISTORY — PX: IR RADIOLOGIST EVAL & MGMT: IMG5224

## 2022-12-05 NOTE — Consult Note (Signed)
Chief Complaint: Patient was seen in consultation today for uterine fibroids at the request of Miller,Mary S  Referring Physician(s): Miller,Mary S  History of Present Illness: Deborah Little is a 37 y.o. female (G1, P1) with a long history of uterine fibroids.  Her primary symptom is severe menorrhagia.  Her cycles are currently regular occurring every 28 days.  She requires use of ultra absorbent pads changed every 2-3 hours.  She passes baseball size clots when she is on her heavier days.  Her most recent hemoglobin was 9.6.  She is currently taking Megace and iron supplementation.  Pap smear in November 2022 was negative.  MRI from 11/17/2022 demonstrates multiple uterine fibroids.  No suspicious features.  No adenomyosis.  Past Medical History:  Diagnosis Date   Anemia    Anxiety    Bilateral swelling of feet and ankles    Biliary dyskinesia 02/2018   Chest pain    Depression    Fatty liver    Fish allergy    Gallbladder problem    Hyperlipidemia    Joint pain    Obesity    Osteoarthritis    Other fatigue    Prediabetes    Shortness of breath    Shortness of breath on exertion    Sleep apnea    Swallowing difficulty    Vitamin D deficiency     Past Surgical History:  Procedure Laterality Date   CHOLECYSTECTOMY N/A 03/13/2018   Procedure: LAPAROSCOPIC CHOLECYSTECTOMY;  Surgeon: Abigail Miyamoto, MD;  Location: [facility];  Service: General;  Laterality: N/A;   GALLBLADDER SURGERY  2018   IR RADIOLOGIST EVAL & MGMT  12/05/2022   WISDOM TOOTH EXTRACTION      Allergies: Fish allergy, Hydrocodone, and Penicillins  Medications: Prior to Admission medications   Medication Sig Start Date End Date Taking? Authorizing Provider  megestrol (MEGACE) 40 MG tablet Take 1 tablet 1-2 times daily to control bleeding 11/06/22  Yes Jerene Bears, MD  tranexamic acid (LYSTEDA) 650 MG TABS tablet Take 1,300 mg by mouth 3 (three) times daily.   Yes [provider]     Family History  Problem Relation Age of Onset   Obesity Mother    Drug abuse Mother    Sleep apnea Mother    Anxiety disorder Mother    Depression Mother    Hypertension Mother    Obesity Father    Drug abuse Father    Alcoholism Father    Hypertension Father    Swallowing difficulties Brother    Stroke Maternal Grandmother    Depression Other    Anxiety disorder Other    Sleep apnea Other    Alcoholism Other    Drug abuse Other    Obesity Other     Social History   Socioeconomic History   Marital status: Single    Spouse name: Not on file   Number of children: 1   Years of education: Not on file   Highest education level: Some college, no degree  Occupational History   Occupation: SOCIAL SERVICES    Employer: [employer]  Tobacco Use   Smoking status: Never   Smokeless tobacco: Never  Vaping Use   Vaping Use: Never used  Substance and Sexual Activity   Alcohol use: Yes    Comment: social   Drug use: No   Sexual activity: Yes    Partners: Male    Comment: Yaz  Other Topics Concern  Not on file  Social History Narrative   Lives at home with child   Right handed   Caffeine: 1 cup of coffee a day   Social Determinants of Health   Financial Resource Strain: Not on file  Food Insecurity: No Food Insecurity (06/21/2022)   Hunger Vital Sign    Worried About Running Out of Food in the Last Year: Never true    Ran Out of Food in the Last Year: Never true  Transportation Needs: No Transportation Needs (06/21/2022)   PRAPARE - Administrator, Civil Service (Medical): No    Lack of Transportation (Non-Medical): No  Physical Activity: Not on file  Stress: Not on file  Social Connections: Not on file    Review of Systems: A 12 point ROS discussed and pertinent positives are indicated in the HPI above.  All other systems are negative.  Review of Systems  Vital Signs: BP 137/82 (BP Location: Left Arm, Patient Position:  Sitting, Cuff Size: Normal)   Pulse (!) 103   Temp 98.6 F (37 C) (Oral)   Resp 14   SpO2 94%     Physical Exam Constitutional:      General: She is not in acute distress.    Appearance: Normal appearance. She is obese.  HENT:     Head: Normocephalic and atraumatic.  Eyes:     General: No scleral icterus. Cardiovascular:     Rate and Rhythm: Normal rate.  Pulmonary:     Effort: Pulmonary effort is normal.  Abdominal:     General: Abdomen is flat.     Palpations: Abdomen is soft.     Tenderness: There is no abdominal tenderness.  Skin:    General: Skin is warm and dry.  Neurological:     Mental Status: She is alert and oriented to person, place, and time.  Psychiatric:        Mood and Affect: Mood normal.        Behavior: Behavior normal.       Imaging: IR Radiologist Eval & Mgmt  Result Date: 12/05/2022 EXAM: NEW PATIENT OFFICE VISIT CHIEF COMPLAINT: SEE EPIC NOTE HISTORY OF PRESENT ILLNESS: SEE EPIC NOTE REVIEW OF SYSTEMS: SEE EPIC NOTE PHYSICAL EXAMINATION: SEE EPIC NOTE ASSESSMENT AND PLAN: SEE EPIC NOTE Electronically Signed   By: Malachy Moan M.D.   On: 12/05/2022 15:48   MR PELVIS W WO CONTRAST  Result Date: 11/18/2022 CLINICAL DATA:  Uterine fibroids.  Treatment planning. EXAM: MRI PELVIS WITHOUT AND WITH CONTRAST TECHNIQUE: Multiplanar multisequence MR imaging of the pelvis was performed both before and after administration of intravenous contrast. CONTRAST:  10mL GADAVIST GADOBUTROL 1 MMOL/ML IV SOLN COMPARISON:  None Available. FINDINGS: Lower Urinary Tract: No bladder or urethral abnormality identified. Bowel:  Unremarkable visualized pelvic bowel loops. Vascular/Lymphatic: No pathologically enlarged lymph nodes or other significant abnormality. Reproductive: -- Uterus: Measures 13.0 x 8.9 by 8.4 cm (volume = 510 cm^3). Multiple uterine fibroids are seen with predominant involvement of the anterior uterine corpus and fundus. These fibroids are predominantly  intramural and submucosal in location. These range in size from less than 1 cm to 5.7 cm in maximum diameter in the right anterior upper uterine corpus. No abnormal endometrial thickening is seen. Cervix and vagina are unremarkable in appearance. -- Intracavitary fibroids:  None. -- Pedunculated fibroids: None. -- Fibroid contrast enhancement: All fibroids show contrast enhancement, without significant degeneration/devascularization. -- Right ovary:  Appears normal.  No mass identified. -- Left ovary:  Appears normal.  No mass identified. Other: No abnormal free fluid. Musculoskeletal:  Unremarkable. IMPRESSION: Multiple uterine fibroids, as described above, largest measuring 5.7 cm. No intracavitary or pedunculated fibroids identified. Normal appearance of both ovaries. No adnexal mass identified. Electronically Signed   By: Danae Orleans M.D.   On: 11/18/2022 10:01    Labs:  CBC: Recent Labs    09/20/22 2041  WBC 8.1  HGB 9.6*  HCT 32.1*  PLT 342    COAGS: No results for input(s): "INR", "APTT" in the last 8760 hours.  BMP: No results for input(s): "NA", "K", "CL", "CO2", "GLUCOSE", "BUN", "CALCIUM", "CREATININE", "GFRNONAA", "GFRAA" in the last 8760 hours.  Invalid input(s): "CMP"  LIVER FUNCTION TESTS: No results for input(s): "BILITOT", "AST", "ALT", "ALKPHOS", "PROT", "ALBUMIN" in the last 8760 hours.  TUMOR MARKERS: No results for input(s): "AFPTM", "CEA", "CA199", "CHROMGRNA" in the last 8760 hours.  Assessment and Plan:  Very pleasant 37 year old female with highly symptomatic uterine fibroids.  Her symptoms are severe, she scores a 97 out of a possible 100 on the uterine fibroid symptom severity score.  Her symptoms are also significantly impactful to her quality of life.  She scored a 0 out of 100 on her quality of life questionnaire.  Blood loss remains significant and she is anemic with a hemoglobin of 9.6.  She is currently not a candidate for hysterectomy.  We discussed  the natural history of uterine fibroids as well as treatment options including uterine artery embolization with superior hypogastric nerve block.  She is a candidate for this procedure and is very excited to proceed and try to regain control over her life.  Please schedule for superior hypogastric nerve block and bilateral uterine artery embolization to be performed as an outpatient.  Thank you for this interesting consult.  I greatly enjoyed meeting Deborah Little and look forward to participating in their care.  A copy of this report was sent to the requesting provider on this date.  Electronically Signed: Sterling Big 12/05/2022, 4:24 PM   I spent a total of 40 Minutes  in face to face in clinical consultation, greater than 50% of which was counseling/coordinating care for uterine fibroids

## 2022-12-06 LAB — CBC
HCT: 34.2 % — ABNORMAL LOW (ref 35.0–45.0)
Hemoglobin: 10.8 g/dL — ABNORMAL LOW (ref 11.7–15.5)
MCH: 24.5 pg — ABNORMAL LOW (ref 27.0–33.0)
MCHC: 31.6 g/dL — ABNORMAL LOW (ref 32.0–36.0)
MCV: 77.7 fL — ABNORMAL LOW (ref 80.0–100.0)
MPV: 10.8 fL (ref 7.5–12.5)
Platelets: 427 10*3/uL — ABNORMAL HIGH (ref 140–400)
RBC: 4.4 10*6/uL (ref 3.80–5.10)
RDW: 15.7 % — ABNORMAL HIGH (ref 11.0–15.0)
WBC: 7.4 10*3/uL (ref 3.8–10.8)

## 2022-12-06 LAB — BASIC METABOLIC PANEL WITH GFR
BUN: 9 mg/dL (ref 7–25)
CO2: 20 mmol/L (ref 20–32)
Calcium: 9.1 mg/dL (ref 8.6–10.2)
Chloride: 106 mmol/L (ref 98–110)
Creat: 0.87 mg/dL (ref 0.50–0.97)
Glucose, Bld: 97 mg/dL (ref 65–99)
Potassium: 4.1 mmol/L (ref 3.5–5.3)
Sodium: 139 mmol/L (ref 135–146)
eGFR: 89 mL/min/{1.73_m2} (ref >=60)

## 2022-12-06 LAB — SPECIMEN COMPROMISED

## 2022-12-09 ENCOUNTER — Other Ambulatory Visit: Payer: 59

## 2022-12-12 ENCOUNTER — Other Ambulatory Visit: Payer: Self-pay | Admitting: Interventional Radiology

## 2022-12-12 ENCOUNTER — Encounter: Payer: Self-pay | Admitting: Interventional Radiology

## 2022-12-12 DIAGNOSIS — D259 Leiomyoma of uterus, unspecified: Secondary | ICD-10-CM

## 2023-01-15 ENCOUNTER — Telehealth: Payer: Self-pay

## 2023-01-15 MED ORDER — ONDANSETRON HCL 8 MG PO TABS: 8.0000 mg | ORAL_TABLET | Freq: Three times a day (TID) | ORAL | 0 refills | Status: DC | PRN

## 2023-01-15 MED ORDER — PROMETHAZINE HCL 12.5 MG PO TABS: 12.5000 mg | ORAL_TABLET | ORAL | 0 refills | Status: DC | PRN

## 2023-01-15 MED ORDER — NAPROXEN 500 MG PO TABS: 500.0000 mg | ORAL_TABLET | Freq: Two times a day (BID) | ORAL | 0 refills | Status: AC

## 2023-01-15 MED ORDER — DOCUSATE SODIUM 100 MG PO CAPS: 100.0000 mg | ORAL_CAPSULE | Freq: Two times a day (BID) | ORAL | 0 refills | Status: AC

## 2023-01-15 NOTE — Telephone Encounter (Signed)
Pt. Is to take Naproxen 500 mg twice a day for 7 days. Colace 100 mg twice a day for 7 days. Percocet 7.5/325mg every 4 hours for 5 days for pain. Zofran 8mg every 8 hours for three days and then as needed for nausea/vomiting. Phenergan 12.5 mg every four hours as needed for nausea/vomiting.  

## 2023-01-15 NOTE — Discharge Instructions (Signed)
Uterine Artery Embolization After Care   What can I expect after the procedure?   After the procedure, it is common to have:   Mild pain or discomfort at the arterial entry site   Uterine Cramping   Cramps can vary in strength from what you would consider to be a bad menstrual cycle all the way up to as severe as labor pains.   The cramping is typically the most severe the afternoon and evening the day of the procedure and begin to improve the next day and each day thereafter.   Vaginal bleeding. Your cycle may become irregular the first several months.   Vaginal discharge. We recommend you wear a panty liner for first 4-6 weeks following your procedure.    Nausea and vomiting.      Follow these instructions at home:   Medicines   Take your medicine exactly as told, at the same time every day. This is vital to helping you with a smooth recovery.   Zofran (ondansetron) is used to prevent nausea before it starts.  You will have a prescription to take 8 mg of this medication every 8 hours.  You should take this even if you don't feel nauseated because it is meant to prevent the nausea from occurring.  Once you get nauseated and start to vomit, you may not be able to keep your other medicines down and your pain can be left untreated.  You can take this with every other dose of the oxycodone/acetaminophen   Naproxen is a non-steroidal anti-inflammatory medicine called and is critical in keeping your inflammation and pain under control.  You must take this every 12 hrs. We recommend 8 am and 8 pm.    Percocet (oxycodone/acetaminophen) is a combination narcotic pain medication with Tylenol.  This is to help with your pain.  Take it every 4 hours regardless of your pain level the first 2 days.  Set an alarm to wake up so you don't miss a dose overnight and get behind on your pain control.  After the first 48 hrs, if your pain is minimal you can take only as needed.    Colace (docusate  sodium) is a stool softener to help prevent constipation.  The pain medications often cause constipation which can be particularly uncomfortable after [place].  We recommend you take this at 8 am and 8 pm with your naproxen.    Phenergan (promethazine) is another medication for nausea.  If you still feel sick to your stomach or vomit despite the Zofran (ondansetron) take this medicine every 8 hours as needed.    Incision care   Leave your bandage on for 24 hrs and keep the area dry   You may remove the bandage after 24 hrs and then shower as normal.    Do not submerge in a bath, pool or hot tub until the small incision is completely healed (5-7 days).   Activity   Do not lift anything that is heavier than 5 lb (2.3 kg)for the first 3 days.     Return to your normal activities after day 5.  Take it slowly and listen to your body. Ask your health care provider what activities are safe for you.   General instructions   Many women find a hot water bottle or heating pad helpful when placed on the lower abdomen.  This is fine to do if it helps your cramps.    Do not use any products that contain nicotine or tobacco.   These products include cigarettes, chewing tobacco, and vaping devices, such as e-cigarettes. These can delay incision healing. If you need help quitting, ask your health care provider.   Do not have sex or put anything in your vagina for at least 14 days.    Do not drink alcohol until your health care provider says it is okay.   Keep all follow-up visits. This is important.      Please contact our office at [phone] for the following symptoms:   You have a fever.   You have more redness, swelling, or pain around your incision.   You have more fluid or blood coming from your incision site.   Your incision feels warm to the touch.   You have pus or a bad smell coming from your incision or vagina.   You have a rash.   You have nausea, or you cannot eat or drink  anything without vomiting.   You have a vaginal discharge that is not getting lighter.   Get help right away if:   You have trouble breathing.   You have chest pain.   You have severe pain in your abdomen, and it does not get better with medicine.   You have leg pain or leg swelling.   You feel dizzy, or you faint.   Do not wait to see if the symptoms will go away.   Do not drive yourself to the hospital.      These symptoms may be an emergency.    Get help right away. Call 911.   Summary   After the procedure, it is common to have cramps, or pain or discomfort at the incision site. You will be given pain medicine.   Follow instructions from your health care provider about how to take care of your incision. Check your incision area every day for signs of infection.   Take over-the-counter and prescription medicines only as told by your health care provider.   Contact your health care provider if you have symptoms of infection or other symptoms that do not get better with treatment.   Thanks for visiting [facility] ! 

## 2023-01-16 ENCOUNTER — Other Ambulatory Visit: Payer: Self-pay | Admitting: Interventional Radiology

## 2023-01-16 ENCOUNTER — Ambulatory Visit
Admission: RE | Admit: 2023-01-16 | Discharge: 2023-01-16 | Disposition: A | Discharge status: Home / Self Care | Payer: 59 | Source: Ambulatory Visit | Attending: Interventional Radiology | Admitting: Interventional Radiology

## 2023-01-16 ENCOUNTER — Telehealth: Payer: Self-pay

## 2023-01-16 DIAGNOSIS — D219 Benign neoplasm of connective and other soft tissue, unspecified: Secondary | ICD-10-CM

## 2023-01-16 DIAGNOSIS — D259 Leiomyoma of uterus, unspecified: Secondary | ICD-10-CM

## 2023-01-16 HISTORY — PX: IR FLUORO GUIDED NEEDLE PLC ASPIRATION/INJECTION LOC: IMG2395

## 2023-01-16 HISTORY — PX: IR EMBO TUMOR ORGAN ISCHEMIA INFARCT INC GUIDE ROADMAPPING: IMG5449

## 2023-01-16 MED ORDER — VANCOMYCIN HCL 1500 MG/300ML IV SOLN
1500.0000 mg | INTRAVENOUS | Status: AC
  Administered 2023-01-16: 1500 mg via INTRAVENOUS

## 2023-01-16 MED ORDER — MIDAZOLAM HCL 2 MG/2ML IJ SOLN: 1.0000 mg | INTRAMUSCULAR | Status: DC | PRN

## 2023-01-16 MED ORDER — FENTANYL CITRATE PF 50 MCG/ML IJ SOSY: 25.0000 ug | PREFILLED_SYRINGE | INTRAMUSCULAR | Status: DC | PRN

## 2023-01-16 MED ORDER — HYDROMORPHONE HCL 1 MG/ML IJ SOLN
1.0000 mg | Freq: Once | INTRAMUSCULAR | Status: AC
  Administered 2023-01-16: 1 mg via INTRAVENOUS

## 2023-01-16 MED ORDER — FENTANYL CITRATE (PF) 100 MCG/2ML IJ SOLN
INTRAMUSCULAR | Status: AC | PRN
  Administered 2023-01-16 (×4): 25 ug via INTRAVENOUS

## 2023-01-16 MED ORDER — PROMETHAZINE HCL 25 MG PO TABS
25.0000 mg | ORAL_TABLET | Freq: Once | ORAL | Status: AC
  Administered 2023-01-16: 25 mg via ORAL

## 2023-01-16 MED ORDER — SODIUM CHLORIDE 0.9 % IV SOLN: 10.0000 mg | Freq: Once | INTRAVENOUS | Status: DC

## 2023-01-16 MED ORDER — FLUCONAZOLE 150 MG PO TABS: 150.0000 mg | ORAL_TABLET | Freq: Every day | ORAL | 0 refills | Status: DC

## 2023-01-16 MED ORDER — PANTOPRAZOLE SODIUM 40 MG IV SOLR
40.0000 mg | Freq: Once | INTRAVENOUS | Status: AC
  Administered 2023-01-16: 40 mg via INTRAVENOUS

## 2023-01-16 MED ORDER — ACETAMINOPHEN 10 MG/ML IV SOLN
1000.0000 mg | Freq: Once | INTRAVENOUS | Status: AC
  Administered 2023-01-16: 1000 mg via INTRAVENOUS

## 2023-01-16 MED ORDER — MIDAZOLAM HCL 2 MG/2ML IJ SOLN
INTRAMUSCULAR | Status: AC | PRN
Start: 2023-01-16 — End: 2023-01-16
  Administered 2023-01-16 (×2): 1 mg via INTRAVENOUS

## 2023-01-16 MED ORDER — KETOROLAC TROMETHAMINE 30 MG/ML IJ SOLN
30.0000 mg | Freq: Once | INTRAMUSCULAR | Status: AC
  Administered 2023-01-16: 30 mg via INTRAMUSCULAR

## 2023-01-16 MED ORDER — SODIUM CHLORIDE 0.9 % IV SOLN
INTRAVENOUS | Status: DC
  Administered 2023-01-16: 250 mL via INTRAVENOUS

## 2023-01-16 MED ORDER — KETOROLAC TROMETHAMINE 30 MG/ML IJ SOLN
30.0000 mg | Freq: Once | INTRAMUSCULAR | Status: AC
  Administered 2023-01-16: 30 mg via INTRAVENOUS

## 2023-01-16 MED ORDER — ONDANSETRON HCL 4 MG/2ML IJ SOLN
8.0000 mg | Freq: Once | INTRAMUSCULAR | Status: AC
  Administered 2023-01-16: 8 mg via INTRAVENOUS

## 2023-01-16 NOTE — Progress Notes (Signed)
Dr. Archer Asa at the bedside at this time to educate patient. Patient verbalized understanding.

## 2023-01-16 NOTE — Progress Notes (Signed)
Dr. Archer Asa at the bedside at this time to assess pt. Pt has lt leg pain. VS and orders assessed and pt denies additional complaints. Will continue to monitor and tx pt according to MD orders.

## 2023-01-16 NOTE — Progress Notes (Signed)
Pt back in nursing recovery area. Pt still drowsy from procedure but will alert up when spoken to. Pt follows commands, talks in complete sentences. VS and orders assessed and will continue to monitor and treat per MD orders. Pt has no additional complaints at this time other than lt leg (thigh, groin) pain (cramping). Pt will remain in nursing station until discharge. Mother is at bedside. Pt had crackers and sprite and tolerating it well.

## 2023-01-16 NOTE — Telephone Encounter (Signed)
Use as needed for candidas after antibiotics.

## 2023-01-17 ENCOUNTER — Other Ambulatory Visit: Payer: Self-pay | Admitting: Interventional Radiology

## 2023-01-17 ENCOUNTER — Telehealth: Payer: Self-pay

## 2023-01-17 DIAGNOSIS — D250 Submucous leiomyoma of uterus: Secondary | ICD-10-CM

## 2023-01-17 NOTE — Telephone Encounter (Signed)
Phone call to pt to follow up from her [place] on 01/16/23. Pt. Reports that her pain overnight was manageable and the highest it has got between pain medication is a 5 which she has managed with a heating pad. Pt. Reports being able to keep liquids and some solid foods down. Pt. Denies any signs of bleeding, infection, redness at the right femoral site, drainage at the site, fever, nausea, or vomiting. Pt has no complaints at this time. Dr. Archer Asa will call her in two weeks to check on her status post-procedure. Pt advised to call back if anything were to change or any concerns arise and we will arrange an in person appointment. Pt verbalized understanding.

## 2023-01-19 ENCOUNTER — Encounter: Payer: Self-pay | Admitting: Interventional Radiology

## 2023-01-23 ENCOUNTER — Encounter: Payer: Self-pay | Admitting: Interventional Radiology

## 2023-02-01 ENCOUNTER — Ambulatory Visit
Admission: RE | Admit: 2023-02-01 | Discharge: 2023-02-01 | Disposition: A | Discharge status: Home / Self Care | Payer: 59 | Source: Ambulatory Visit | Attending: Interventional Radiology | Admitting: Interventional Radiology

## 2023-02-01 DIAGNOSIS — D250 Submucous leiomyoma of uterus: Secondary | ICD-10-CM

## 2023-02-01 HISTORY — PX: IR RADIOLOGIST EVAL & MGMT: IMG5224

## 2023-02-01 NOTE — Progress Notes (Signed)
Chief Complaint: Patient was consulted remotely today (TeleHealth) for uterine fibroids post [place] at the request of Glen Kesinger K.    Referring Physician(s): Jerene Bears  History of Present Illness: Deborah Little is a 37 y.o. female with symptomatic uterine fibroids.  She underwent bilateral uterine artery embolization on 01/16/2023.  We spoke over the phone today for her 2-week follow-up appointment.  She reports that she is doing great.  She has not had a cycle or any bleeding since the procedure.  She has completely recovered and has no pain or other ill effects.  She feels her energy is already improved and her neighbor saw her the other day and told her she looked fabulous.  She is very happy so far.  Past Medical History:  Diagnosis Date   Anemia    Anxiety    Bilateral swelling of feet and ankles    Biliary dyskinesia 02/2018   Chest pain    Depression    Fatty liver    Fish allergy    Gallbladder problem    Hyperlipidemia    Joint pain    Obesity    Osteoarthritis    Other fatigue    Prediabetes    Shortness of breath    Shortness of breath on exertion    Sleep apnea    Swallowing difficulty    Vitamin D deficiency     Past Surgical History:  Procedure Laterality Date   CHOLECYSTECTOMY N/A 03/13/2018   Procedure: LAPAROSCOPIC CHOLECYSTECTOMY;  Surgeon: Abigail Miyamoto, MD;  Location: [facility];  Service: General;  Laterality: N/A;   GALLBLADDER SURGERY  2018   IR EMBO TUMOR ORGAN ISCHEMIA INFARCT INC GUIDE ROADMAPPING  01/16/2023   IR FLUORO GUIDED NEEDLE PLC ASPIRATION/INJECTION LOC  01/16/2023   IR RADIOLOGIST EVAL & MGMT  12/05/2022   IR RADIOLOGIST EVAL & MGMT  02/01/2023   WISDOM TOOTH EXTRACTION      Allergies: Fish allergy, Hydrocodone, and Penicillins  Medications: Prior to Admission medications   Medication Sig Start Date End Date Taking? Authorizing Provider  fluconazole (DIFLUCAN) 150 MG tablet Take 1 tablet (150 mg  total) by mouth daily. 01/16/23   Sterling Big, MD  megestrol (MEGACE) 40 MG tablet Take 1 tablet 1-2 times daily to control bleeding 11/06/22   Jerene Bears, MD  ondansetron (ZOFRAN) 8 MG tablet Take 1 tablet (8 mg total) by mouth every 8 (eight) hours as needed for nausea or vomiting. 01/15/23   Sterling Big, MD  promethazine (PHENERGAN) 12.5 MG tablet Take 1 tablet (12.5 mg total) by mouth every 4 (four) hours as needed for nausea or vomiting. 01/15/23   Sterling Big, MD  tranexamic acid (LYSTEDA) 650 MG TABS tablet Take 1,300 mg by mouth 3 (three) times daily.    [provider]     Family History  Problem Relation Age of Onset   Obesity Mother    Drug abuse Mother    Sleep apnea Mother    Anxiety disorder Mother    Depression Mother    Hypertension Mother    Obesity Father    Drug abuse Father    Alcoholism Father    Hypertension Father    Swallowing difficulties Brother    Stroke Maternal Grandmother    Depression Other    Anxiety disorder Other    Sleep apnea Other    Alcoholism Other    Drug abuse Other    Obesity Other  Social History   Socioeconomic History   Marital status: Single    Spouse name: Not on file   Number of children: 1   Years of education: Not on file   Highest education level: Some college, no degree  Occupational History   Occupation: SOCIAL SERVICES    Employer: [employer]  Tobacco Use   Smoking status: Never   Smokeless tobacco: Never  Vaping Use   Vaping status: Never Used  Substance and Sexual Activity   Alcohol use: Yes    Comment: social   Drug use: No   Sexual activity: Yes    Partners: Male    Comment: Yaz  Other Topics Concern   Not on file  Social History Narrative   Lives at home with child   Right handed   Caffeine: 1 cup of coffee a day   Social Determinants of Health   Financial Resource Strain: Low Risk  (10/25/2022)   Received [facility], [facility]   Overall  Financial Resource Strain (CARDIA)    Difficulty of Paying Living Expenses: Not hard at all  Food Insecurity: No Food Insecurity (10/25/2022)   Received from [facility], [facility]   Hunger Vital Sign    Worried About Running Out of Food in the Last Year: Never true    Ran Out of Food in the Last Year: Never true  Transportation Needs: No Transportation Needs (10/25/2022)   Received from [facility], [facility]   PRAPARE - Transportation    Lack of Transportation (Medical): No    Lack of Transportation (Non-Medical): No  Physical Activity: Inactive (04/01/2021)   Received from [facility], [facility]   Exercise Vital Sign    Days of Exercise per Week: 1 day    Minutes of Exercise per Session: 0 min  Stress: No Stress Concern Present (04/01/2021)   Received from [facility], [facility] - Occupational Stress Questionnaire    Feeling of Stress : Not at all  Social Connections: Unknown (10/31/2021)   Received from [facility], [facility]   Social Network    Social Network: Not on file    Review of Systems  Review of Systems: A 12 point ROS discussed and pertinent positives are indicated in the HPI above.  All other systems are negative.    Physical Exam No direct physical exam was performed (except for noted visual exam findings with Video Visits).    Vital Signs: There were no vitals taken for this visit.  Imaging: IR Radiologist Eval & Mgmt  Result Date: 02/01/2023 EXAM: ESTABLISHED PATIENT OFFICE VISIT CHIEF COMPLAINT: SEE EPIC NOTE HISTORY OF PRESENT ILLNESS: SEE EPIC NOTE REVIEW OF SYSTEMS: SEE EPIC NOTE PHYSICAL EXAMINATION: SEE EPIC NOTE ASSESSMENT AND PLAN: SEE EPIC NOTE Electronically Signed   By: Malachy Moan M.D.   On: 02/01/2023 15:56   IR EMBO TUMOR ORGAN ISCHEMIA INFARCT INC GUIDE ROADMAPPING  Result Date: 01/16/2023 INDICATION: 37 year old female with highly symptomatic uterine fibroids. She  presents for superior hypogastric nerve block and bilateral uterine artery embolization. EXAM: IR EMBO TUMOR ORGAN ISCHEMIA INFARCT INC GUIDE ROADMAPPING; IR FLUORO GUIDE NEEDLE PLACEMENT /BIOPSY MEDICATIONS: PRE PROCEDURE 1.5 g vancomycin administered intravenously; 1000 mg Tylenol administered intravenously, 40 mg Protonix administered intravenously, 8 mg Zofran administered intravenously all by radiology nursing under my supervision. INTRA PROCEDURE Toradol 30 mg intramuscular, Toradol 30 mg IV administered by radiology nursing under my supervision. POST PROCEDURE Dilaudid 1 mg intravenous, Phenergan  25 mg p.o. administered by radiology nursing under my supervision. ANESTHESIA/SEDATION: Moderate (conscious) sedation was employed during this procedure. A total of Versed 2 mg and Fentanyl 100 mcg was administered intravenously. Moderate Sedation Time: 67 minutes. The patient's level of consciousness and vital signs were monitored continuously by radiology nursing throughout the procedure under my direct supervision. CONTRAST:  80 mL Isovue 300 FLUOROSCOPY: Radiation Exposure Index (as provided by the fluoroscopic device): 441.3 mGy Kerma COMPLICATIONS: None immediate. PROCEDURE: Informed consent was obtained from the patient following explanation of the procedure, risks, benefits and alternatives. The patient understands, agrees and consents for the procedure. All questions were addressed. A time out was performed prior to the initiation of the procedure. Maximal barrier sterile technique utilized including caps, mask, sterile gowns, sterile gloves, large sterile drape, hand hygiene, and Betadine prep. The right common femoral artery was interrogated with ultrasound and found to be widely patent. An image was obtained and stored for the medical record. Local anesthesia was attained by infiltration with 1% lidocaine. A small dermatotomy was made. Under real-time sonographic guidance, the vessel was punctured with a  21 gauge micropuncture needle. Using standard technique, the initial micro needle was exchanged over a 0.018 micro wire for a transitional 4 [place] micro sheath. The micro sheath was then exchanged over a 0.035 wire for a 5 French vascular sheath. A C2 cobra catheter was advanced over a Bentson wire up in over the aortic bifurcation and into the left internal iliac artery. Attention was then turned to the superior hypogastric block portion of the procedure. The image intensifier was angulated until the L5 vertebral body could be viewed en face. A suitable skin entry site targeting the central aspect of the L5 vertebral body was identified and marked. Local anesthesia was attained by infiltration with 1% lidocaine. Under intermittent fluoroscopic guidance, a 20 cm 22 gauge Chiba needle was carefully advanced onto the anterior surface of the L5 vertebral body. This was confirmed in the lateral projection. A gentle injection of contrast material was performed in both the lateral and frontal projections confirming that the needle tip is within the prevertebral soft tissues and there is no vascular uptake. 20 mg of 0.5% ropivacaine was then slowly injected. The needle was then removed. Attention was turned back to portion procedure. A left internal iliac arteriogram was performed. The origin of the left uterine artery was identified. A renegade hi Flo microcatheter was advanced over a Fathom 16 wire into the horizontal segment of the left uterine artery. Arteriography was performed confirming opacification of the intramural uterine branches. No evidence of cervical vaginal branch. Particle embolization was performed utilizing 1 vial of 500 micron Embozene and 2/3 of a vial of 700 micron Embozene. Post embolization arteriography confirms near stasis. The microcatheter was removed. The C2 cobra catheter was formed into a Waltman's loop and used to select the ipsilateral right internal iliac artery. Contrast injection was  performed with digital subtraction angiography and the origin of the right uterine artery was identified. The high-flow microcatheter was advanced over the Fathom 16 wire into the horizontal segment of the right uterine artery. Arteriography was performed confirming catheter placement and absence of cervical vaginal branches distal to the catheter tip. Particle embolization was performed utilizing 1 vial of 500 micron Embozene and 1/3 via 700 micron Embozene. Follow-up contrast injection confirms near stasis. The Waltman's loop was un formed and the catheter removed over a Bentson wire. Hemostasis was then attained with the assistance of a Celt arterial  closure device. IMPRESSION: 1. Successful superior hypogastric nerve block. 2. Successful bilateral uterine artery embolization. Electronically Signed   By: Malachy Moan M.D.   On: 01/16/2023 11:27   IR Fluoro Guide Ndl Plmt / BX  Result Date: 01/16/2023 INDICATION: 37 year old female with highly symptomatic uterine fibroids. She presents for superior hypogastric nerve block and bilateral uterine artery embolization. EXAM: IR EMBO TUMOR ORGAN ISCHEMIA INFARCT INC GUIDE ROADMAPPING; IR FLUORO GUIDE NEEDLE PLACEMENT /BIOPSY MEDICATIONS: PRE PROCEDURE 1.5 g vancomycin administered intravenously; 1000 mg Tylenol administered intravenously, 40 mg Protonix administered intravenously, 8 mg Zofran administered intravenously all by radiology nursing under my supervision. INTRA PROCEDURE Toradol 30 mg intramuscular, Toradol 30 mg IV administered by radiology nursing under my supervision. POST PROCEDURE Dilaudid 1 mg intravenous, Phenergan 25 mg p.o. administered by radiology nursing under my supervision. ANESTHESIA/SEDATION: Moderate (conscious) sedation was employed during this procedure. A total of Versed 2 mg and Fentanyl 100 mcg was administered intravenously. Moderate Sedation Time: 67 minutes. The patient's level of consciousness and vital signs were monitored  continuously by radiology nursing throughout the procedure under my direct supervision. CONTRAST:  80 mL Isovue 300 FLUOROSCOPY: Radiation Exposure Index (as provided by the fluoroscopic device): 441.3 mGy Kerma COMPLICATIONS: None immediate. PROCEDURE: Informed consent was obtained from the patient following explanation of the procedure, risks, benefits and alternatives. The patient understands, agrees and consents for the procedure. All questions were addressed. A time out was performed prior to the initiation of the procedure. Maximal barrier sterile technique utilized including caps, mask, sterile gowns, sterile gloves, large sterile drape, hand hygiene, and Betadine prep. The right common femoral artery was interrogated with ultrasound and found to be widely patent. An image was obtained and stored for the medical record. Local anesthesia was attained by infiltration with 1% lidocaine. A small dermatotomy was made. Under real-time sonographic guidance, the vessel was punctured with a 21 gauge micropuncture needle. Using standard technique, the initial micro needle was exchanged over a 0.018 micro wire for a transitional 4 [place] micro sheath. The micro sheath was then exchanged over a 0.035 wire for a 5 French vascular sheath. A C2 cobra catheter was advanced over a Bentson wire up in over the aortic bifurcation and into the left internal iliac artery. Attention was then turned to the superior hypogastric block portion of the procedure. The image intensifier was angulated until the L5 vertebral body could be viewed en face. A suitable skin entry site targeting the central aspect of the L5 vertebral body was identified and marked. Local anesthesia was attained by infiltration with 1% lidocaine. Under intermittent fluoroscopic guidance, a 20 cm 22 gauge Chiba needle was carefully advanced onto the anterior surface of the L5 vertebral body. This was confirmed in the lateral projection. A gentle injection of  contrast material was performed in both the lateral and frontal projections confirming that the needle tip is within the prevertebral soft tissues and there is no vascular uptake. 20 mg of 0.5% ropivacaine was then slowly injected. The needle was then removed. Attention was turned back to portion procedure. A left internal iliac arteriogram was performed. The origin of the left uterine artery was identified. A renegade hi Flo microcatheter was advanced over a Fathom 16 wire into the horizontal segment of the left uterine artery. Arteriography was performed confirming opacification of the intramural uterine branches. No evidence of cervical vaginal branch. Particle embolization was performed utilizing 1 vial of 500 micron Embozene and 2/3 of a vial of 700 micron Embozene.  Post embolization arteriography confirms near stasis. The microcatheter was removed. The C2 cobra catheter was formed into a Waltman's loop and used to select the ipsilateral right internal iliac artery. Contrast injection was performed with digital subtraction angiography and the origin of the right uterine artery was identified. The high-flow microcatheter was advanced over the Fathom 16 wire into the horizontal segment of the right uterine artery. Arteriography was performed confirming catheter placement and absence of cervical vaginal branches distal to the catheter tip. Particle embolization was performed utilizing 1 vial of 500 micron Embozene and 1/3 via 700 micron Embozene. Follow-up contrast injection confirms near stasis. The Waltman's loop was un formed and the catheter removed over a Bentson wire. Hemostasis was then attained with the assistance of a Celt arterial closure device. IMPRESSION: 1. Successful superior hypogastric nerve block. 2. Successful bilateral uterine artery embolization. Electronically Signed   By: Malachy Moan M.D.   On: 01/16/2023 11:27    Labs:  CBC: Recent Labs    09/20/22 2041 12/05/22 0000  WBC 8.1  7.4  HGB 9.6* 10.8*  HCT 32.1* 34.2*  PLT 342 427*    COAGS: No results for input(s): "INR", "APTT" in the last 8760 hours.  BMP: Recent Labs    12/05/22 0000  NA 139  K 4.1  CL 106  CO2 20  GLUCOSE 97  BUN 9  CALCIUM 9.1  CREATININE 0.87    LIVER FUNCTION TESTS: No results for input(s): "BILITOT", "AST", "ALT", "ALKPHOS", "PROT", "ALBUMIN" in the last 8760 hours.  TUMOR MARKERS: No results for input(s): "AFPTM", "CEA", "CA199", "CHROMGRNA" in the last 8760 hours.  Assessment and Plan:  Very pleasant 37 year old female doing very well 2 weeks status post bilateral uterine artery embolization.  She reports that she is feeling great is completely recovered and has no issues at this time.  1.) Next clinic visit in November 2024.    Electronically Signed: Sterling Big 02/01/2023, 4:39 PM   I spent a total of  10 Minutes in remote  clinical consultation, greater than 50% of which was counseling/coordinating care for post [place].    Visit type: Audio only (telephone). Audio (no video) only due to patient preference. Alternative for in-person consultation at [facility], [address] [place], [place], [place]. This visit type was conducted due to national recommendations for restrictions regarding the COVID-19 Pandemic (e.g. social distancing).  This format is felt to be most appropriate for this patient at this time.  All issues noted in this document were discussed and addressed.

## 2023-03-07 ENCOUNTER — Telehealth (HOSPITAL_BASED_OUTPATIENT_CLINIC_OR_DEPARTMENT_OTHER): Payer: Self-pay | Admitting: Obstetrics & Gynecology

## 2023-03-07 NOTE — Telephone Encounter (Signed)
Patient would like for the nurse to call her and schedule  labiaplasty surgery.

## 2023-03-20 ENCOUNTER — Encounter (HOSPITAL_BASED_OUTPATIENT_CLINIC_OR_DEPARTMENT_OTHER): Payer: Self-pay | Admitting: Obstetrics & Gynecology

## 2023-03-20 ENCOUNTER — Ambulatory Visit (HOSPITAL_BASED_OUTPATIENT_CLINIC_OR_DEPARTMENT_OTHER): Payer: 59 | Admitting: Obstetrics & Gynecology

## 2023-03-20 VITALS — BP 122/63 | HR 77 | Ht 67.0 in | Wt 314.8 lb

## 2023-03-20 DIAGNOSIS — N611 Abscess of the breast and nipple: Secondary | ICD-10-CM | POA: Diagnosis not present

## 2023-03-20 DIAGNOSIS — Z87898 Personal history of other specified conditions: Secondary | ICD-10-CM

## 2023-03-20 DIAGNOSIS — N906 Unspecified hypertrophy of vulva: Secondary | ICD-10-CM | POA: Diagnosis not present

## 2023-03-20 DIAGNOSIS — Z803 Family history of malignant neoplasm of breast: Secondary | ICD-10-CM | POA: Diagnosis not present

## 2023-03-20 MED ORDER — SULFAMETHOXAZOLE-TRIMETHOPRIM 800-160 MG PO TABS
1.0000 | ORAL_TABLET | Freq: Two times a day (BID) | ORAL | 0 refills | Status: DC
Start: 2023-03-20 — End: 2023-07-11

## 2023-03-21 ENCOUNTER — Other Ambulatory Visit (HOSPITAL_BASED_OUTPATIENT_CLINIC_OR_DEPARTMENT_OTHER): Payer: 59

## 2023-03-22 ENCOUNTER — Emergency Department (HOSPITAL_BASED_OUTPATIENT_CLINIC_OR_DEPARTMENT_OTHER)
Admission: EM | Admit: 2023-03-22 | Discharge: 2023-03-22 | Disposition: A | Discharge status: Home / Self Care | Payer: 59 | Attending: Emergency Medicine | Admitting: Emergency Medicine

## 2023-03-22 ENCOUNTER — Encounter (HOSPITAL_BASED_OUTPATIENT_CLINIC_OR_DEPARTMENT_OTHER): Payer: Self-pay

## 2023-03-22 ENCOUNTER — Other Ambulatory Visit (HOSPITAL_BASED_OUTPATIENT_CLINIC_OR_DEPARTMENT_OTHER): Payer: 59

## 2023-03-22 ENCOUNTER — Emergency Department (HOSPITAL_BASED_OUTPATIENT_CLINIC_OR_DEPARTMENT_OTHER): Payer: 59 | Admitting: Radiology

## 2023-03-22 ENCOUNTER — Other Ambulatory Visit: Payer: Self-pay

## 2023-03-22 DIAGNOSIS — Y92410 Unspecified street and highway as the place of occurrence of the external cause: Secondary | ICD-10-CM | POA: Diagnosis not present

## 2023-03-22 DIAGNOSIS — M25512 Pain in left shoulder: Secondary | ICD-10-CM | POA: Insufficient documentation

## 2023-03-22 MED ORDER — CYCLOBENZAPRINE HCL 10 MG PO TABS: 10.0000 mg | ORAL_TABLET | Freq: Two times a day (BID) | ORAL | 0 refills | Status: DC | PRN

## 2023-03-22 MED ORDER — LIDOCAINE 5 % EX PTCH
1.0000 | MEDICATED_PATCH | CUTANEOUS | Status: DC
  Administered 2023-03-22: 1 via TRANSDERMAL
  Filled 2023-03-22: qty 1

## 2023-03-22 MED ORDER — LIDOCAINE 5 % EX PTCH: 1.0000 | MEDICATED_PATCH | CUTANEOUS | 0 refills | Status: DC
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
## 2023-03-22 NOTE — Discharge Instructions (Addendum)
Please follow with your primary care provider in regards to recent symptoms and ER visit.  Today your physical exam is reassuring and as we agreed we agreed to forego any imaging at this time of the head and neck.  Your x-rays do show a possible fracture in your left glenoid/shoulder and so you are given a sling.  We discussed the possibility of a CT scan however you wish to have the primary care provider order the CT scan of your left shoulder which is reasonable at this time.  I prescribed Flexeril which is a muscle relaxer.  Please do not operate machinery or drive after taking this medicine as it is very sedating.  I have also prescribed you lidocaine patches as well.  You may take Tylenol every 6 hours needed for pain.  If symptoms change or worsen please return to ER.
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
## 2023-03-22 NOTE — ED Notes (Signed)
Reviewed AVS with patient, patient expressed understanding of directions, denies further questions at this time. 

## 2023-03-23 ENCOUNTER — Other Ambulatory Visit (HOSPITAL_BASED_OUTPATIENT_CLINIC_OR_DEPARTMENT_OTHER): Payer: Self-pay | Admitting: Obstetrics & Gynecology

## 2023-03-23 DIAGNOSIS — Z803 Family history of malignant neoplasm of breast: Secondary | ICD-10-CM

## 2023-03-23 DIAGNOSIS — Z9189 Other specified personal risk factors, not elsewhere classified: Secondary | ICD-10-CM

## 2023-03-23 LAB — WOUND CULTURE

## 2023-03-26 DIAGNOSIS — Z803 Family history of malignant neoplasm of breast: Secondary | ICD-10-CM | POA: Insufficient documentation

## 2023-03-26 DIAGNOSIS — Z87898 Personal history of other specified conditions: Secondary | ICD-10-CM | POA: Insufficient documentation

## 2023-03-26 NOTE — Progress Notes (Signed)
GYNECOLOGY  VISIT  CC:   surgical consultation  HPI: 37 y.o. G12P1001 Single Black or African American female here for surgical consultation and follow up after [place].  Bleeding is much, much better.  Last hb was 10.8 but it sure it is improved due to the way she feels.  She does not have any follow up with Dr. Archer Asa scheduled.  Will reach out to him about this as usually there is a 6 month follow up.  Pt here to discuss reduction of left labia majora.  This is asymmetric and very bothersome to her.  Hurts a lot when pulled by underwear which happens frequently enough.  Can get caught in underwear with sitting as well so this is bothersome to her as well and does cause pain.  Has met deductible this year so would really like this done this year if possible.    Mother diagnosed with breast cancer this year as well.  She is in her early 31's.  Pt unsure about genetic testing but I suspect done due to her mother's age.  She will check and let me know.  If positive, would recommend pt be tested as well.  Discussed 50% risk of pt being positive if mother is positive.  She agrees that she would want this done.  Also, would change recommendations about mammogram screening.  Lastly, pt has tender area on left breast that has been present for several days that she would like me to look at today.  Denies fever.   Past Medical History:  Diagnosis Date   Anemia    Anxiety    Bilateral swelling of feet and ankles    Biliary dyskinesia 02/2018   Chest pain    Depression    Fatty liver    Fish allergy    Gallbladder problem    Hyperlipidemia    Joint pain    Obesity    Osteoarthritis    Other fatigue    Prediabetes    Shortness of breath    Shortness of breath on exertion    Sleep apnea    Swallowing difficulty    Vitamin D deficiency     MEDS:   Current Outpatient Medications on File Prior to Visit  Medication Sig Dispense Refill   fluconazole (DIFLUCAN) 150 MG tablet Take 1 tablet (150  mg total) by mouth daily. 2 tablet 0   megestrol (MEGACE) 40 MG tablet Take 1 tablet 1-2 times daily to control bleeding 60 tablet 2   ondansetron (ZOFRAN) 8 MG tablet Take 1 tablet (8 mg total) by mouth every 8 (eight) hours as needed for nausea or vomiting. 30 tablet 0   promethazine (PHENERGAN) 12.5 MG tablet Take 1 tablet (12.5 mg total) by mouth every 4 (four) hours as needed for nausea or vomiting. 30 tablet 0   tranexamic acid (LYSTEDA) 650 MG TABS tablet Take 1,300 mg by mouth 3 (three) times daily.     No current facility-administered medications on file prior to visit.    ALLERGIES: Fish allergy, Hydrocodone, and Penicillins  SH:  single, non smoker  Review of Systems  Constitutional: Negative.   Skin:        Breast lesion    PHYSICAL EXAMINATION:    BP 122/63 (BP Location: Right Arm, Patient Position: Sitting, Cuff Size: Large)   Pulse 77   Ht 5\' 7"  (1.702 m) Comment: reported  Wt (!) 314 lb 12.8 oz (142.8 kg)   BMI 49.30 kg/m     General  appearance: alert, cooperative and appears stated age Breasts:  superficial skin abscess with superficial fluctuance, with mild compression, lesion opened easily and drained.  Purulent drainage present.  Culture obtained.   Abdomen: soft, non-tender; bowel sounds normal; no masses,  no organomegaly Lymph:  no inguinal LAD noted  Pelvic: External genitalia:  no lesions, left labia minora is about 6cm and much larger then right.  Shown to pt with mirror and procedure for reduction discussed              Urethra:  normal appearing urethra with no masses, tenderness or lesions              Bartholins and Skenes: normal                  Chaperone, Hendricks Milo, CMA.  Assessment/Plan: 1. Labial hypertrophy - will plan to proceed with surgery this year if possible.  Risks including bleeding, infection, scarring, pain, hematoma, need for additional surgery all discussed.  Post op recovery discussed.  Questions answered.    2. Family  history of breast cancer in mother - pt will see if her mother had genetic testing.  If so, will place genetic counseling referral.  Then will determine breast imaging for pt.  3. Left breast abscess - sulfamethoxazole-trimethoprim (BACTRIM DS) 800-160 MG tablet; Take 1 tablet by mouth 2 (two) times daily.  Dispense: 10 tablet; Refill: 0 - WOUND CULTURE  4. History of prediabetes - most recent hb A1c was 6.1  5.  S/p [place] - will reach out to find out about scheduling follow up

## 2023-03-28 ENCOUNTER — Emergency Department (HOSPITAL_BASED_OUTPATIENT_CLINIC_OR_DEPARTMENT_OTHER): Payer: 59

## 2023-03-28 ENCOUNTER — Emergency Department (HOSPITAL_BASED_OUTPATIENT_CLINIC_OR_DEPARTMENT_OTHER)
Admission: EM | Admit: 2023-03-28 | Discharge: 2023-03-28 | Disposition: A | Discharge status: Home / Self Care | Payer: 59 | Attending: Emergency Medicine | Admitting: Emergency Medicine

## 2023-03-28 ENCOUNTER — Other Ambulatory Visit: Payer: Self-pay

## 2023-03-28 ENCOUNTER — Emergency Department (HOSPITAL_BASED_OUTPATIENT_CLINIC_OR_DEPARTMENT_OTHER): Payer: 59 | Admitting: Radiology

## 2023-03-28 ENCOUNTER — Inpatient Hospital Stay: Payer: 59 | Attending: Genetic Counselor | Admitting: Genetic Counselor

## 2023-03-28 ENCOUNTER — Other Ambulatory Visit: Payer: Self-pay | Admitting: Genetic Counselor

## 2023-03-28 ENCOUNTER — Inpatient Hospital Stay: Payer: 59

## 2023-03-28 ENCOUNTER — Encounter: Payer: Self-pay | Admitting: Genetic Counselor

## 2023-03-28 DIAGNOSIS — Z8481 Family history of carrier of genetic disease: Secondary | ICD-10-CM | POA: Diagnosis not present

## 2023-03-28 DIAGNOSIS — Z1379 Encounter for other screening for genetic and chromosomal anomalies: Secondary | ICD-10-CM

## 2023-03-28 DIAGNOSIS — M79641 Pain in right hand: Secondary | ICD-10-CM | POA: Insufficient documentation

## 2023-03-28 DIAGNOSIS — Z803 Family history of malignant neoplasm of breast: Secondary | ICD-10-CM | POA: Diagnosis not present

## 2023-03-28 DIAGNOSIS — S46912A Strain of unspecified muscle, fascia and tendon at shoulder and upper arm level, left arm, initial encounter: Secondary | ICD-10-CM

## 2023-03-28 DIAGNOSIS — S6391XA Sprain of unspecified part of right wrist and hand, initial encounter: Secondary | ICD-10-CM | POA: Diagnosis not present

## 2023-03-28 DIAGNOSIS — S4992XA Unspecified injury of left shoulder and upper arm, initial encounter: Secondary | ICD-10-CM | POA: Diagnosis present

## 2023-03-28 DIAGNOSIS — Z801 Family history of malignant neoplasm of trachea, bronchus and lung: Secondary | ICD-10-CM

## 2023-03-28 DIAGNOSIS — Z800 Family history of malignant neoplasm of digestive organs: Secondary | ICD-10-CM | POA: Diagnosis not present

## 2023-03-28 DIAGNOSIS — Y92410 Unspecified street and highway as the place of occurrence of the external cause: Secondary | ICD-10-CM | POA: Insufficient documentation

## 2023-03-28 DIAGNOSIS — M25531 Pain in right wrist: Secondary | ICD-10-CM | POA: Insufficient documentation

## 2023-03-28 LAB — GENETIC SCREENING ORDER

## 2023-03-28 LAB — PREGNANCY, URINE: Preg Test, Ur: NEGATIVE

## 2023-03-28 MED ORDER — ACETAMINOPHEN 500 MG PO TABS
1000.0000 mg | ORAL_TABLET | Freq: Once | ORAL | Status: AC
  Administered 2023-03-28: 1000 mg via ORAL
  Filled 2023-03-28: qty 2

## 2023-03-28 MED ORDER — DIAZEPAM 5 MG PO TABS
5.0000 mg | ORAL_TABLET | Freq: Once | ORAL | Status: AC
  Administered 2023-03-28: 5 mg via ORAL
  Filled 2023-03-28: qty 1

## 2023-03-28 NOTE — ED Notes (Signed)
Pt reports she received a sling on her previous visit.

## 2023-03-28 NOTE — ED Notes (Signed)
Pt discharged in stable condition. Pt expressed understanding about discharge instructions and to follow up with pcp and to return to ER for any further concerns or complications. Pt ambulated out with even steady gait, no apparent distress.
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
## 2023-03-28 NOTE — Progress Notes (Signed)
REFERRING PROVIDER: Stevphen Rochester, MD [address] [place],  [place] [address]  PRIMARY PROVIDER:  Stevphen Rochester, MD  PRIMARY REASON FOR VISIT:  1. Family history of gene mutation   2. Family history of breast cancer in mother   3. Family history of colon cancer     HISTORY OF PRESENT ILLNESS:   Deborah Little, a 37 y.o. female, was seen for a [place] cancer genetics consultation at the request of Dr. Donzetta Kohut due to a family history of an ATM gene mutation in her mother.  Deborah Little presents to clinic today to discuss the possibility of a hereditary predisposition to cancer, to discuss genetic testing, and to further clarify her future cancer risks, as well as potential cancer risks for family members.   Deborah Little is a 37 y.o. female with no personal history of cancer.    RISK FACTORS:  Menarche was at age 65.  First live birth at age 17.  OCP use for approximately 4 years.  Ovaries intact: yes.  Uterus intact: yes.  Menopausal status: premenopausal.  HRT use: 0 years. Colonoscopy: no Mammogram within the last year: no. Any excessive radiation exposure in the past: no  Past Medical History:  Diagnosis Date   Anemia    Anxiety    Bilateral swelling of feet and ankles    Biliary dyskinesia 02/2018   Chest pain    Depression    Fatty liver    Fish allergy    Gallbladder problem    Hyperlipidemia    Joint pain    Obesity    Osteoarthritis    Other fatigue    Prediabetes    Shortness of breath    Shortness of breath on exertion    Sleep apnea    Swallowing difficulty    Vitamin D deficiency     Past Surgical History:  Procedure Laterality Date   CHOLECYSTECTOMY N/A 03/13/2018   Procedure: LAPAROSCOPIC CHOLECYSTECTOMY;  Surgeon: Abigail Miyamoto, MD;  Location: [facility];  Service: General;  Laterality: N/A;   GALLBLADDER SURGERY  2018   IR EMBO TUMOR ORGAN ISCHEMIA INFARCT INC GUIDE ROADMAPPING  01/16/2023   IR FLUORO GUIDED  NEEDLE PLC ASPIRATION/INJECTION LOC  01/16/2023   IR RADIOLOGIST EVAL & MGMT  12/05/2022   IR RADIOLOGIST EVAL & MGMT  02/01/2023   WISDOM TOOTH EXTRACTION      Social History   Socioeconomic History   Marital status: Single    Spouse name: Not on file   Number of children: 1   Years of education: Not on file   Highest education level: Some college, no degree  Occupational History   Occupation: SOCIAL SERVICES    Employer: [employer]  Tobacco Use   Smoking status: Never   Smokeless tobacco: Never  Vaping Use   Vaping status: Never Used  Substance and Sexual Activity   Alcohol use: Yes    Comment: social   Drug use: No   Sexual activity: Yes    Partners: Male    Comment: Yaz  Other Topics Concern   Not on file  Social History Narrative   Lives at home with child   Right handed   Caffeine: 1 cup of coffee a day   Social Determinants of Health   Financial Resource Strain: Low Risk  (10/25/2022)   Received from [employer], [facility]   Overall Financial Resource Strain (CARDIA)    Difficulty of Paying Living Expenses: Not  hard at all  Food Insecurity: No Food Insecurity (10/25/2022)   Received from [facility], [facility]   Hunger Vital Sign    Worried About Running Out of Food in the Last Year: Never true    Ran Out of Food in the Last Year: Never true  Transportation Needs: No Transportation Needs (10/25/2022)   Received from [facility], [facility]   [facility] - Transportation    Lack of Transportation (Medical): No    Lack of Transportation (Non-Medical): No  Physical Activity: Inactive (04/01/2021)   Received from [facility], [facility]   Exercise Vital Sign    Days of Exercise per Week: 1 day    Minutes of Exercise per Session: 0 min  Stress: No Stress Concern Present (04/01/2021)   Received from [facility], [facility] - Occupational Stress Questionnaire    Feeling of Stress : Not at all   Social Connections: Unknown (10/31/2021)   Received from [facility], [facility]   Social Network    Social Network: Not on file     FAMILY HISTORY:  We obtained a detailed, 4-generation family history.  Significant diagnoses are listed below: Family History  Problem Relation Age of Onset   Breast cancer Mother 48       ATM+   Lung cancer Maternal Aunt 42   Colon cancer Paternal Aunt 69 - 59   Pancreatic cancer Maternal Great-grandmother 10 - 59     Deborah Little's mother was recently diagnosed with breast cancer at age 73. She had hereditary cancer genetic testing performed through [facility] (48 genes). A pathogenic variant was detected in the ATM gene (c.6829del) and a variant of uncertain significance was detected in the BRIP1 gene (c.797C>T). Deborah Little maternal aunt was diagnosed with lung cancer at age 71, she did not smoke and was reportedly told her cancer was genetic, she died at age 20. Deborah Little maternal grandfather's half-sister was diagnosed with pancreatic cancer at age 42, she died at age 41. Her maternal great grandmother (grandfather's mother) was diagnosed with either pancreatic cancer or colon cancer in her early 34s, she died at age 75.   Deborah Little paternal aunt was diagnosed with colon cancer in her 56s. Two paternal great aunts (grandmother's sisters) were diagnosed with breast cancer at unknown ages (>68) and are deceased. One paternal first cousin once removed was diagnosed with colon cancer in her 7s. There is no reported Ashkenazi Jewish ancestry.    GENETIC COUNSELING ASSESSMENT: Ms. Landry is a 37 y.o. female with a family history of an ATM gene mutation. Therefore, we discussed and recommended the following at today's visit.   Cancer Risks for ATM: Women have a 21-24% lifetime risk of breast cancer. 2-3% risk for epithelial ovarian cancer 5-10% risk for pancreatic cancer  There is emerging evidence suggesting an increased risk for prostate cancer.  Research  is continuing to help learn more about the cancers associated with ATM pathogenic variants and what the exact risks are to develop these cancers.  Management Recommendations:  Breast Screening/Risk Reduction: Breast cancer screening includes: Breast awareness beginning at age 81 Monthly self-breast examination beginning at age 45 Clinical breast examination every 6-12 months beginning at age 28 or at the age of the earliest diagnosed breast cancer in the family, if onset was before age 72 Annual mammogram with consideration of tomosynthesis starting at age 44 or 10 years prior to the youngest age of diagnosis, whichever comes first  Consider breast MRI with and without contrast starting at age 51-35 Consider additional risk reducing strategies such as Tamoxifen Evidence is insufficient for a prophylactic risk-reducing mastectomy, manage based on family history  For patients who are treated for breast cancer and have not had bilateral mastectomy, screening should continue as described  Ovarian Cancer Screening/Risk Reduction: Evidence insufficient for risk-reducing salpingo oophorectomy; manage based on family history If there is a family history of ovarian cancer, have a discussion with your physician about the benefits and limitations of screening and risk reducing strategies  Pancreatic Cancer Screening/Risk Reduction: Avoid smoking, heavy alcohol use, and obesity. Pancreatic cancer screening may be considered starting at age 7 (or 31 years younger than the earliest exocrine pancreatic cancer diagnosis in the family, whichever is earlier). Screening includes annual endoscopic ultrasound and/or MRI of the pancreas.  Prostate Cancer Screening: Consider beginning annual PSA blood test and digital rectal exams at age 46.  Additional considerations: There is insufficient evidence to recommend against radiation therapy.  Individuals with a single pathogenic ATM variant are also carriers of  ataxia telangiectasia. Ataxia telangiectasia is associated with childhood cancer risks as well as other medical problems (such as difficulty with movement, balance and coordination problems, neuropathy, and weakened immunity). For there to be a risk of ataxia telangiectasia in offspring, both the patient and their partner would each have to carry a pathogenic variant in ATM; in this case, the risk to have an affected child is 25%.  Implications for Family Members: Hereditary predisposition to cancer due to pathogenic variants in the ATM gene has autosomal dominant inheritance. This means that an individual with a pathogenic variant has a 50% chance of passing the condition on to his/her offspring. Identification of a pathogenic variant allows for the recognition of at-risk relatives who can pursue testing for the familial variant.   Additional Information: Deborah Little was offered [facility] free familial single site testing vs. a gene panel. She chose to pursue a gene panel. We then reviewed the option of a common hereditary cancer panel (34 genes) and an expanded pan-cancer panel (71 genes). Deborah Little was informed of the benefits and limitations of each panel, including that expanded pan-cancer panels contain several genes that do not have clear management guidelines at this point in time.  We also discussed that as the number of genes included on a panel increases, the chances of variants of uncertain significance increases.  After considering the benefits and limitations of each gene panel, Deborah Little elected to have [facility] CancerNext-Expanded Panel.  The CancerNext-Expanded gene panel offered by [facility] and includes sequencing, rearrangement, and RNA analysis for the following 71 genes: AIP, ALK, APC, ATM, AXIN2, BAP1, BARD1, BMPR1A, BRCA1, BRCA2, BRIP1, CDC73, CDH1, CDK4, CDKN1B, CDKN2A, CHEK2, CTNNA1, DICER1, FH, FLCN, KIF1B, LZTR1, MAX, MEN1, MET, MLH1, MSH2, MSH3, MSH6, MUTYH, NF1, NF2, NTHL1, PALB2,  PHOX2B, PMS2, POT1, PRKAR1A, PTCH1, PTEN, RAD51C, RAD51D, RB1, RET, SDHA, SDHAF2, SDHB, SDHC, SDHD, SMAD4, SMARCA4, SMARCB1, SMARCE1, STK11, SUFU, TMEM127, TP53, TSC1, TSC2, and VHL (sequencing and deletion/duplication); EGFR, EGLN1, HOXB13, KIT, MITF, PDGFRA, POLD1, and POLE (sequencing only); EPCAM and GREM1 (deletion/duplication only).    Based on Deborah Little's family history of cancer, she meets medical criteria for genetic testing. Despite that she meets criteria, she may still have an out of pocket cost. We discussed that if her out of pocket cost for testing is over $100, the laboratory should contact them to discuss self-pay prices, patient pay assistance programs, if applicable, and other  billing options.  We discussed that some people do not want to undergo genetic testing due to fear of genetic discrimination.  A federal law called the Genetic Information Non-Discrimination Act (GINA) of 2008 helps protect individuals against genetic discrimination based on their genetic test results.  It impacts both health insurance and employment.  With health insurance, it protects against increased premiums, being kicked off insurance or being forced to take a test in order to be insured.  For employment it protects against hiring, firing and promoting decisions based on genetic test results.  GINA does not apply to those in the [employer], those who work for companies with less than 15 employees, and new life insurance or long-term disability insurance policies.  Health status due to a cancer diagnosis is not protected under GINA.  PLAN: After considering the risks, benefits, and limitations, Deborah Little provided informed consent to pursue genetic testing and the blood sample was sent to [facility] for analysis of the CancerNext-Expanded Panel. Results should be available within approximately 2-3 weeks' time, at which point they will be disclosed by telephone to Deborah Little, as will any additional recommendations  warranted by these results. Deborah Little will receive a summary of her genetic counseling visit and a copy of her results once available. This information will also be available in Epic.   Deborah Little questions were answered to her satisfaction today. Our contact information was provided should additional questions or concerns arise. Thank you for the referral and allowing [place] to share in the care of your patient.   [name], MS, [facility] Genetic Counselor [email or web address] (P) [phone]  The patient was seen for a total of 35 minutes in face-to-face genetic counseling. The patient was seen alone.  Drs. Pamelia Hoit and/or Mosetta Putt were available to discuss this case as needed.  _______________________________________________________________________ For Office Staff:  Number of people involved in session: 1 Was an Intern/ student involved with case: no
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
## 2023-04-26 ENCOUNTER — Encounter: Payer: Self-pay | Admitting: Genetic Counselor

## 2023-04-26 ENCOUNTER — Telehealth: Payer: Self-pay | Admitting: Genetic Counselor

## 2023-04-26 DIAGNOSIS — Z1379 Encounter for other screening for genetic and chromosomal anomalies: Secondary | ICD-10-CM | POA: Insufficient documentation

## 2023-04-26 NOTE — Telephone Encounter (Signed)
I contacted Deborah Little to discuss her genetic testing results. No pathogenic variants were identified in the 71 genes analyzed. Of note, a variant of uncertain significance was identified in the BRIP1 and NTHL1 genes. Detailed clinic note to follow.  The test report has been scanned into EPIC and is located under the Molecular Pathology section of the Results Review tab.  A portion of the result report is included below for reference.   [name], MS, [facility] Genetic Counselor [email or web address] (P) [phone]

## 2023-04-30 ENCOUNTER — Ambulatory Visit: Payer: Self-pay | Admitting: Genetic Counselor

## 2023-04-30 ENCOUNTER — Encounter: Payer: Self-pay | Admitting: Genetic Counselor

## 2023-04-30 DIAGNOSIS — Z1379 Encounter for other screening for genetic and chromosomal anomalies: Secondary | ICD-10-CM

## 2023-04-30 NOTE — Progress Notes (Signed)
HPI:   Deborah Little was previously seen in the [facility] due to a Little history of an ATM gene mutation in her mother. Please refer to our prior cancer genetics clinic note for more information regarding our discussion, assessment and recommendations, at the time. Deborah Little recent genetic test results were disclosed to her, as were recommendations warranted by these results. These results and recommendations are discussed in more detail below.  CANCER HISTORY:  Oncology History   No history exists.    Little HISTORY:  We obtained a detailed, 4-generation Little history.  Significant diagnoses are listed below:      Little History  Problem Relation Age of Onset   Breast cancer Mother 3        ATM+   Lung cancer Maternal Aunt 81   Colon cancer Paternal Aunt 83 - 59   Pancreatic cancer Maternal Great-grandmother 64 - 30       Deborah Little mother was recently diagnosed with breast cancer at age 55. She had hereditary cancer genetic testing performed through [facility] (48 genes). A pathogenic variant was detected in the ATM gene (c.6829del) and a variant of uncertain significance was detected in the BRIP1 gene (c.797C>T). Deborah Little maternal aunt was diagnosed with lung cancer at age 38, she did not smoke and was reportedly told her cancer was genetic, she died at age 54. Deborah Little maternal grandfather's half-sister was diagnosed with pancreatic cancer at age 3, she died at age 6. Her maternal great grandmother (grandfather's mother) was diagnosed with either pancreatic cancer or colon cancer in her early 35s, she died at age 28.    Deborah Little paternal aunt was diagnosed with colon cancer in her 48s. Two paternal great aunts (grandmother's sisters) were diagnosed with breast cancer at unknown ages (>56) and are deceased. One paternal first cousin once removed was diagnosed with colon cancer in her 103s. There is no reported Ashkenazi Jewish ancestry.   GENETIC TEST RESULTS:   The [facility] CancerNext-Expanded Panel found no pathogenic mutations.   She did NOT inherit the ATM gene mutation identified in her mother.   The CancerNext-Expanded gene panel offered by [facility] and includes sequencing, rearrangement, and RNA analysis for the following 71 genes: AIP, ALK, APC, ATM, AXIN2, BAP1, BARD1, BMPR1A, BRCA1, BRCA2, BRIP1, CDC73, CDH1, CDK4, CDKN1B, CDKN2A, CHEK2, CTNNA1, DICER1, FH, FLCN, KIF1B, LZTR1, MAX, MEN1, MET, MLH1, MSH2, MSH3, MSH6, MUTYH, NF1, NF2, NTHL1, PALB2, PHOX2B, PMS2, POT1, PRKAR1A, PTCH1, PTEN, RAD51C, RAD51D, RB1, RET, SDHA, SDHAF2, SDHB, SDHC, SDHD, SMAD4, SMARCA4, SMARCB1, SMARCE1, STK11, SUFU, TMEM127, TP53, TSC1, TSC2, and VHL (sequencing and deletion/duplication); EGFR, EGLN1, HOXB13, KIT, MITF, PDGFRA, POLD1, and POLE (sequencing only); EPCAM and GREM1 (deletion/duplication only).    The test report has been scanned into EPIC and is located under the Molecular Pathology section of the Results Review tab.  A portion of the result report is included below for reference. Genetic testing reported out on 04/19/2023.       Genetic testing identified a variant of uncertain significance (VUS) in the BRIP1 gene (p.T266M) and the NTHL1 gene (c.710-4G>A).  At this time, it is unknown if the variants are associated with an increased risk for cancer or if they are benign, but most uncertain variants are reclassified to benign. It should not be used to make medical management decisions. With time, we suspect the laboratory will determine the significance of the variants, if any. If the laboratory reclassifies the variants, we will attempt to  contact Deborah Little to discuss it further.   Deborah Little tested negative for the ATM gene mutation identified in her mother. We call this result a true negative result because the cancer-causing mutation was identified in Deborah Little, and she did not inherit it. Given this negative result, Deborah Little of developing  ATM-related cancers are the same as they are in the general population.    Even though a pathogenic variant was not identified that explains her paternal Little history of cancer, possible explanations may include: There may be no hereditary risk for cancer on her paternal side of Little. The cancers in her paternal Little members may be due to other genetic or environmental factors. There may be a gene mutation in one of these genes that current testing methods cannot detect, but that chance is small. There could be another gene that has not yet been discovered, or that we have not yet tested, that is responsible for the paternal Little history of cancer.  It is also possible there is a hereditary cause for the cancer on her paternal side of the Little that Deborah Little did not inherit.  Therefore, it is important to remain in touch with cancer genetics in the future so that we can continue to offer Deborah Little the most up to date genetic testing.   ADDITIONAL GENETIC TESTING:  We discussed with Deborah Little that her genetic testing was fairly extensive.  If there are genes identified to increase cancer risk that can be analyzed in the future, we would be happy to discuss and coordinate this testing at that time.    CANCER SCREENING RECOMMENDATIONS:  Deborah Little test result is considered negative (normal). An individual's cancer risk and medical management are not determined by genetic test results alone. Overall cancer risk assessment incorporates additional factors, including personal medical history, Little history, and any available genetic information that may result in a personalized plan for cancer prevention and surveillance. Therefore, it is recommended she continue to follow the cancer management and screening guidelines provided by her primary healthcare provider.  RECOMMENDATIONS FOR Little MEMBERS:   Since she did not inherit a mutation in a cancer predisposition gene included on this panel, her  son could not have inherited a mutation from her in one of these genes. Other members of the Little may still carry a pathogenic variant in one of these genes that Deborah Little did not inherit. Based on the Little history, we recommend her siblings and maternal Little members have genetic testing for her mother's ATM gene mutation. We do not recommend familial testing for the BRIP1 and NTHL1 variants of uncertain significance (VUS).  FOLLOW-UP:  Cancer genetics is a rapidly advancing field and it is possible that new genetic tests will be appropriate for her and/or her Little members in the future. We encouraged her to remain in contact with cancer genetics on an annual basis so we can update her personal and Little histories and let her know of advances in cancer genetics that may benefit this Little.   Our contact number was provided. Ms. Kuyper questions were answered to her satisfaction, and she knows she is welcome to call us at anytime with additional questions or concerns.   [name], MS, [facility] Genetic Counselor [email or web address] (P) [phone]

## 2023-05-14 ENCOUNTER — Encounter (HOSPITAL_BASED_OUTPATIENT_CLINIC_OR_DEPARTMENT_OTHER): Payer: Self-pay | Admitting: Obstetrics & Gynecology

## 2023-05-22 NOTE — Telephone Encounter (Signed)
Pt called and inquired about if surgery could be done before the end of the year. Consulted with Dr. Hyacinth Meeker and she has sent a message over to the surgery center to see what they have available. Pt informed once Dr. Hyacinth Meeker hear some information back with them. We will reach back out to her.

## 2023-06-06 ENCOUNTER — Other Ambulatory Visit (HOSPITAL_BASED_OUTPATIENT_CLINIC_OR_DEPARTMENT_OTHER): Payer: Self-pay | Admitting: Certified Nurse Midwife

## 2023-07-03 ENCOUNTER — Encounter (HOSPITAL_BASED_OUTPATIENT_CLINIC_OR_DEPARTMENT_OTHER): Payer: Self-pay | Admitting: Obstetrics & Gynecology

## 2023-07-03 ENCOUNTER — Other Ambulatory Visit (HOSPITAL_COMMUNITY)
Admission: RE | Admit: 2023-07-03 | Discharge: 2023-07-03 | Disposition: A | Discharge status: Home / Self Care | Payer: 59 | Source: Ambulatory Visit | Attending: Obstetrics & Gynecology | Admitting: Obstetrics & Gynecology

## 2023-07-03 ENCOUNTER — Ambulatory Visit (INDEPENDENT_AMBULATORY_CARE_PROVIDER_SITE_OTHER): Payer: 59 | Admitting: Obstetrics & Gynecology

## 2023-07-03 VITALS — BP 135/89 | HR 74 | Wt 316.4 lb

## 2023-07-03 DIAGNOSIS — R7303 Prediabetes: Secondary | ICD-10-CM

## 2023-07-03 DIAGNOSIS — L723 Sebaceous cyst: Secondary | ICD-10-CM

## 2023-07-03 DIAGNOSIS — Z113 Encounter for screening for infections with a predominantly sexual mode of transmission: Secondary | ICD-10-CM | POA: Diagnosis not present

## 2023-07-03 DIAGNOSIS — N760 Acute vaginitis: Secondary | ICD-10-CM

## 2023-07-03 DIAGNOSIS — Z9189 Other specified personal risk factors, not elsewhere classified: Secondary | ICD-10-CM | POA: Diagnosis not present

## 2023-07-03 DIAGNOSIS — N906 Unspecified hypertrophy of vulva: Secondary | ICD-10-CM

## 2023-07-03 DIAGNOSIS — Z862 Personal history of diseases of the blood and blood-forming organs and certain disorders involving the immune mechanism: Secondary | ICD-10-CM

## 2023-07-03 NOTE — Progress Notes (Signed)
 GYNECOLOGY  VISIT  CC:   questions, STI testing  HPI: 38 y.o. G36P1001 Single Black or African American female here for several issues/concerns.  1) has new partner and desires STI testing.  H/O BV so would like to be tested for this today as well  2) Hx of uterine fibroids with significant menorrhagia and anemia.  Bleeding is much, much improved.  Lasts 5 days.  Cycles regular and light.  No recent hb testing.  Feels better.  Will repeat CBC and iron  levels today.  3) Ready to proceed with labial reduction and excision of sebaceous cysts.  She has hx of prediabetes.  Has not lost any weight.  Will recheck today.  Surgery scheduling request sent today.  4)  Her mother has hx of breast cancer.  Had ATM gene mutation.  Pt had genetic testing but not sure she understands the results of the testing.  Reviewed with her.  ATM mutation testing negative but she has two VUS noted.  Discussed this finding and what is means for her--no decision regarding care are changed because of this.  She understands these could be reclassified one day.  Tyrer Cusick model done today with her and showed increased risks for cancer with 20.4% risk.   Past Medical History:  Diagnosis Date   Anemia    Anxiety    Bilateral swelling of feet and ankles    Biliary dyskinesia 02/2018   Chest pain    Depression    Fatty liver    Fish allergy     Gallbladder problem    Hyperlipidemia    Joint pain    Obesity    Osteoarthritis    Other fatigue    Prediabetes    Shortness of breath    Shortness of breath on exertion    Sleep apnea    Swallowing difficulty    Vitamin D  deficiency     MEDS:   Current Outpatient Medications on File Prior to Visit  Medication Sig Dispense Refill   cyclobenzaprine  (FLEXERIL ) 10 MG tablet Take 1 tablet (10 mg total) by mouth 2 (two) times daily as needed for muscle spasms. (Patient not taking: Reported on 07/03/2023) 20 tablet 0   fluconazole  (DIFLUCAN ) 150 MG tablet Take 1 tablet  (150 mg total) by mouth daily. (Patient not taking: Reported on 07/03/2023) 2 tablet 0   lidocaine  (LIDODERM ) 5 % Place 1 patch onto the skin daily. Remove & Discard patch within 12 hours or as directed by MD (Patient not taking: Reported on 07/03/2023) 30 patch 0   megestrol  (MEGACE ) 40 MG tablet Take 1 tablet 1-2 times daily to control bleeding (Patient not taking: Reported on 07/03/2023) 60 tablet 2   ondansetron  (ZOFRAN ) 8 MG tablet Take 1 tablet (8 mg total) by mouth every 8 (eight) hours as needed for nausea or vomiting. (Patient not taking: Reported on 07/03/2023) 30 tablet 0   promethazine  (PHENERGAN ) 12.5 MG tablet Take 1 tablet (12.5 mg total) by mouth every 4 (four) hours as needed for nausea or vomiting. (Patient not taking: Reported on 07/03/2023) 30 tablet 0   sulfamethoxazole -trimethoprim  (BACTRIM  DS) 800-160 MG tablet Take 1 tablet by mouth 2 (two) times daily. (Patient not taking: Reported on 07/03/2023) 10 tablet 0   tranexamic acid  (LYSTEDA ) 650 MG TABS tablet Take 1,300 mg by mouth 3 (three) times daily. (Patient not taking: Reported on 07/03/2023)     No current facility-administered medications on file prior to visit.    ALLERGIES: Fish allergy , Hydrocodone , and  Penicillins  SH:  single, non smoker  Review of Systems  Constitutional: Negative.   Genitourinary: Negative.     PHYSICAL EXAMINATION:    BP 135/89 (BP Location: Left Arm, Patient Position: Sitting, Cuff Size: Large)   Pulse 74   Wt (!) 316 lb 6.4 oz (143.5 kg)   BMI 49.56 kg/m     Physical Exam Constitutional:      Appearance: Normal appearance.  Neurological:     General: No focal deficit present.     Mental Status: She is alert.  Psychiatric:        Mood and Affect: Mood normal.      Assessment/Plan: 1. Labial hypertrophy (Primary) - surgical scheduling will be initated - Ambulatory Referral For Surgery Scheduling  2. Sebaceous cyst of vulva  3. Routine screening for STI (sexually transmitted  infection) - Cervicovaginal ancillary only( [place]) - RPR+HBsAg+HIV - Hepatitis C antibody  4. Acute vaginitis - Cervicovaginal ancillary only( [place])  5. Increased risk of breast cancer - MMG and breast MRI discussed.  We will plan this starting around age 64.  6. Prediabetes - Hemoglobin A1c  7. History of anemia - CBC - Iron , TIBC and Ferritin Panel

## 2023-07-04 LAB — CBC
Hematocrit: 35.9 % (ref 34.0–46.6)
Hemoglobin: 11 g/dL — ABNORMAL LOW (ref 11.1–15.9)
MCH: 24.9 pg — ABNORMAL LOW (ref 26.6–33.0)
MCHC: 30.6 g/dL — ABNORMAL LOW (ref 31.5–35.7)
MCV: 81 fL (ref 79–97)
Platelets: 368 10*3/uL (ref 150–450)
RBC: 4.41 x10E6/uL (ref 3.77–5.28)
RDW: 17 % — ABNORMAL HIGH (ref 11.7–15.4)
WBC: 7.6 10*3/uL (ref 3.4–10.8)

## 2023-07-04 LAB — HEMOGLOBIN A1C
Est. average glucose Bld gHb Est-mCnc: 117 mg/dL
Hgb A1c MFr Bld: 5.7 % — ABNORMAL HIGH (ref 4.8–5.6)

## 2023-07-04 LAB — RPR+HBSAG+HIV
HIV Screen 4th Generation wRfx: NONREACTIVE
Hepatitis B Surface Ag: NEGATIVE
RPR Ser Ql: NONREACTIVE

## 2023-07-04 LAB — IRON,TIBC AND FERRITIN PANEL
Ferritin: 21 ng/mL (ref 15–150)
Iron Saturation: 13 % — ABNORMAL LOW (ref 15–55)
Iron: 49 ug/dL (ref 27–159)
Total Iron Binding Capacity: 363 ug/dL (ref 250–450)
UIBC: 314 ug/dL (ref 131–425)

## 2023-07-04 LAB — CERVICOVAGINAL ANCILLARY ONLY
Bacterial Vaginitis (gardnerella): NEGATIVE
Chlamydia: NEGATIVE
Comment: NEGATIVE
Comment: NEGATIVE
Comment: NEGATIVE
Comment: NORMAL
Neisseria Gonorrhea: NEGATIVE
Trichomonas: NEGATIVE

## 2023-07-04 LAB — HEPATITIS C ANTIBODY: Hep C Virus Ab: NONREACTIVE

## 2023-07-09 ENCOUNTER — Encounter (HOSPITAL_BASED_OUTPATIENT_CLINIC_OR_DEPARTMENT_OTHER): Payer: Self-pay | Admitting: Obstetrics & Gynecology

## 2023-07-11 ENCOUNTER — Telehealth: Payer: Self-pay | Admitting: Pharmacy Technician

## 2023-07-11 ENCOUNTER — Encounter: Payer: Self-pay | Admitting: Obstetrics & Gynecology

## 2023-07-11 ENCOUNTER — Other Ambulatory Visit (HOSPITAL_BASED_OUTPATIENT_CLINIC_OR_DEPARTMENT_OTHER): Payer: Self-pay | Admitting: Obstetrics & Gynecology

## 2023-07-11 NOTE — Telephone Encounter (Signed)
Dr. Hyacinth Meeker, [facility] note:  patient will be scheduled as soon as possible.  Auth Submission: NO AUTH NEEDED Site of care: Site of care: [facility] Payer: [employer] Medication & CPT/J Code(s) submitted: Venofer (Iron Sucrose) J1756 Route of submission (phone, fax, portal):  Phone # Fax # Auth type: Buy/Bill PB Units/visits requested: 2 DOSES Reference number:  Approval from: 07/11/23 to 12/09/23

## 2023-07-23 ENCOUNTER — Encounter: Payer: Self-pay | Admitting: Obstetrics & Gynecology

## 2023-07-30 ENCOUNTER — Ambulatory Visit: Payer: 59

## 2023-07-30 MED ORDER — DIPHENHYDRAMINE HCL 25 MG PO CAPS
25.0000 mg | ORAL_CAPSULE | Freq: Once | ORAL | Status: DC
Start: 2023-07-30 — End: 2023-07-30

## 2023-07-30 MED ORDER — IRON SUCROSE 20 MG/ML IV SOLN: 200.0000 mg | Freq: Once | INTRAVENOUS | Status: DC

## 2023-07-30 MED ORDER — ACETAMINOPHEN 325 MG PO TABS
650.0000 mg | ORAL_TABLET | Freq: Once | ORAL | Status: DC
Start: 2023-07-30 — End: 2023-07-30

## 2023-07-31 ENCOUNTER — Ambulatory Visit: Payer: 59

## 2023-07-31 VITALS — BP 108/75 | HR 76 | Temp 98.0°F | Resp 20 | Ht 67.0 in | Wt 314.2 lb

## 2023-07-31 DIAGNOSIS — D500 Iron deficiency anemia secondary to blood loss (chronic): Secondary | ICD-10-CM

## 2023-07-31 DIAGNOSIS — D509 Iron deficiency anemia, unspecified: Secondary | ICD-10-CM | POA: Diagnosis not present

## 2023-07-31 MED ORDER — ACETAMINOPHEN 325 MG PO TABS
650.0000 mg | ORAL_TABLET | Freq: Once | ORAL | Status: AC
Start: 2023-07-31 — End: 2023-07-31
  Administered 2023-07-31: 650 mg via ORAL
  Filled 2023-07-31: qty 2

## 2023-07-31 MED ORDER — DIPHENHYDRAMINE HCL 25 MG PO CAPS
25.0000 mg | ORAL_CAPSULE | Freq: Once | ORAL | Status: AC
  Administered 2023-07-31: 25 mg via ORAL
  Filled 2023-07-31: qty 1

## 2023-07-31 MED ORDER — IRON SUCROSE 20 MG/ML IV SOLN
200.0000 mg | Freq: Once | INTRAVENOUS | Status: AC
  Administered 2023-07-31: 200 mg via INTRAVENOUS
  Filled 2023-07-31: qty 10

## 2023-07-31 NOTE — Progress Notes (Signed)
Diagnosis: Iron Deficiency Anemia  Provider:  Chilton Greathouse MD  Procedure: IV Push  IV Type: Peripheral, IV Location: L Forearm  Venofer (Iron Sucrose), Dose: 200 mg  Post Infusion IV Care: Observation period completed and Peripheral IV Discontinued  Discharge: Condition: Good, Destination: Home . AVS Provided  Performed by:  Adriana Mccallum, RN

## 2023-07-31 NOTE — Patient Instructions (Signed)

## 2023-08-02 ENCOUNTER — Encounter: Payer: Self-pay | Admitting: Obstetrics & Gynecology

## 2023-08-02 ENCOUNTER — Ambulatory Visit (HOSPITAL_COMMUNITY)
Admission: EM | Admit: 2023-08-02 | Discharge: 2023-08-03 | Disposition: A | Discharge status: Home / Self Care | Payer: 59

## 2023-08-02 DIAGNOSIS — Z79899 Other long term (current) drug therapy: Secondary | ICD-10-CM | POA: Diagnosis not present

## 2023-08-02 DIAGNOSIS — F419 Anxiety disorder, unspecified: Secondary | ICD-10-CM | POA: Diagnosis not present

## 2023-08-02 DIAGNOSIS — F32A Depression, unspecified: Secondary | ICD-10-CM

## 2023-08-02 DIAGNOSIS — Z008 Encounter for other general examination: Secondary | ICD-10-CM
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
## 2023-08-02 NOTE — Discharge Instructions (Signed)
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
## 2023-08-03 NOTE — ED Notes (Signed)
Patient was given AVS and work excuse and educated on the instruction.s Patient voice understanding and denied having question. Patient was walked by security to get belongings.

## 2023-08-07 ENCOUNTER — Ambulatory Visit: Payer: 59

## 2023-08-07 MED ORDER — SODIUM CHLORIDE 0.9 % IV BOLUS
250.0000 mL | Freq: Once | INTRAVENOUS | Status: AC
  Filled 2023-08-07: qty 250

## 2023-08-07 MED ORDER — IRON SUCROSE 20 MG/ML IV SOLN: 200.0000 mg | Freq: Once | INTRAVENOUS | Status: AC

## 2023-08-07 MED ORDER — IRON SUCROSE 200 MG IVPB - SIMPLE MED: 200.0000 mg | Freq: Once | Status: DC

## 2023-08-07 MED ORDER — DIPHENHYDRAMINE HCL 25 MG PO CAPS: 25.0000 mg | ORAL_CAPSULE | Freq: Once | ORAL | Status: AC

## 2023-08-07 MED ORDER — IRON SUCROSE 20 MG/ML IV SOLN: 200.0000 mg | Freq: Once | INTRAVENOUS | Status: DC

## 2023-08-07 MED ORDER — ACETAMINOPHEN 325 MG PO TABS: 650.0000 mg | ORAL_TABLET | Freq: Once | ORAL | Status: AC

## 2023-09-03 ENCOUNTER — Telehealth: Payer: Self-pay

## 2023-09-03 ENCOUNTER — Encounter: Payer: Self-pay | Admitting: Obstetrics & Gynecology

## 2023-09-03 NOTE — Telephone Encounter (Signed)
 Patient called me back to secure surgery date w/ Dr. Hyacinth Meeker on 09/17/23. Patient preferred to scheduled at 9 am and will arrive at [facility] by 7 am. I provided surgery details and pre-op instructions over the phone. Details will be sent to Mychart.

## 2023-09-03 NOTE — Telephone Encounter (Signed)
 I left patient a voicemail stating that Dr. Hyacinth Meeker has availability on 09/17/23 for her procedure. I asked patient to call me to schedule at [phone].

## 2023-09-04 ENCOUNTER — Other Ambulatory Visit (HOSPITAL_COMMUNITY)
Admission: RE | Admit: 2023-09-04 | Discharge: 2023-09-04 | Disposition: A | Discharge status: Home / Self Care | Source: Ambulatory Visit | Attending: Obstetrics & Gynecology | Admitting: Obstetrics & Gynecology

## 2023-09-04 ENCOUNTER — Ambulatory Visit (INDEPENDENT_AMBULATORY_CARE_PROVIDER_SITE_OTHER): Admitting: Obstetrics & Gynecology

## 2023-09-04 ENCOUNTER — Encounter (HOSPITAL_BASED_OUTPATIENT_CLINIC_OR_DEPARTMENT_OTHER): Payer: Self-pay | Admitting: Obstetrics & Gynecology

## 2023-09-04 VITALS — BP 133/76 | HR 89 | Ht 67.0 in | Wt 315.8 lb

## 2023-09-04 DIAGNOSIS — D500 Iron deficiency anemia secondary to blood loss (chronic): Secondary | ICD-10-CM

## 2023-09-04 DIAGNOSIS — Z202 Contact with and (suspected) exposure to infections with a predominantly sexual mode of transmission: Secondary | ICD-10-CM

## 2023-09-04 DIAGNOSIS — F4322 Adjustment disorder with anxiety: Secondary | ICD-10-CM | POA: Diagnosis not present

## 2023-09-04 DIAGNOSIS — N906 Unspecified hypertrophy of vulva: Secondary | ICD-10-CM | POA: Diagnosis not present

## 2023-09-04 MED ORDER — VALACYCLOVIR HCL 500 MG PO TABS
500.0000 mg | ORAL_TABLET | Freq: Every day | ORAL | 4 refills | Status: DC
Start: 2023-09-04 — End: 2023-11-14

## 2023-09-04 NOTE — Progress Notes (Signed)
 38 y.o. G25P1001 Single Black or [place] American female here for discussion of upcoming procedure. She is not sure, at this time, that she would like to proceed.  Last year was having significant issues with bleeding due to fibroid uterus.  She underwent [place] 03/06/2023.  She had almost immediate improvement in bleeding.  Now, cycles are much lighter.  Not needing a lot of the feminine products she was using in the past.  Labia are not bothering her like they were.  Just doesn't think she needs to proceed at this time.  Long hx of iron deficiency anemia.  Hb was 11.0 in January.  Ferritin was 21.  Did have three iron infusions.  Last one was 2/11.  Will plan to repeat blood work in may.  Has been diagnosed with HSV on her face.  Kept having a recurrent, red, painful lesion on left cheek in the same place.  Did have viral culture done 07/31/2023 (reviewed in care everywhere) with HSV 2 positive testing.  Was treated with Valtrex TID.  Lesion has resolved but there is some darkening on the skin.  This has occurred several times.  Has many concerns about this and all questions answered/addressed.  As this is on her face, would like to consider treatment with more long term therapy.  Suppressive therapy discussed.  Transmission risk discussed and recommended not to be skin to skin with prodromal symptoms or until any blisters have crusted.  Is struggling some with this positive culture.  Feel she would benefit from therapy.  She is very open to this.  Referral will be placed to [facility].    Would like to have STI testing done today as well.     Past Surgical History:  Procedure Laterality Date   CHOLECYSTECTOMY N/A 03/13/2018   Procedure: LAPAROSCOPIC CHOLECYSTECTOMY;  Surgeon: Abigail Miyamoto, MD;  Location: [facility];  Service: General;  Laterality: N/A;   GALLBLADDER SURGERY  2018   IR EMBO TUMOR ORGAN ISCHEMIA INFARCT INC GUIDE ROADMAPPING  01/16/2023   IR FLUORO GUIDED NEEDLE  PLC ASPIRATION/INJECTION LOC  01/16/2023   IR RADIOLOGIST EVAL & MGMT  12/05/2022   IR RADIOLOGIST EVAL & MGMT  02/01/2023   WISDOM TOOTH EXTRACTION      Past Medical History:  Diagnosis Date   Anemia    Anxiety    Bilateral swelling of feet and ankles    Biliary dyskinesia 02/2018   Chest pain    Depression    Fatty liver    Fish allergy    Gallbladder problem    Hyperlipidemia    Joint pain    Obesity    Osteoarthritis    Other fatigue    Prediabetes    Shortness of breath    Shortness of breath on exertion    Sleep apnea    Swallowing difficulty    Vitamin D deficiency     Allergies: Fish allergy, Hydrocodone, and Penicillins  Current Outpatient Medications  Medication Sig Dispense Refill   cholecalciferol (VITAMIN D3) 25 MCG (1000 UNIT) tablet Take 1,000 Units by mouth daily.     cyclobenzaprine (FLEXERIL) 10 MG tablet Take 1 tablet (10 mg total) by mouth 2 (two) times daily as needed for muscle spasms. (Patient not taking: Reported on 09/04/2023) 20 tablet 0   lidocaine (LIDODERM) 5 % Place 1 patch onto the skin daily. Remove & Discard patch within 12 hours or as directed by MD (Patient not taking: Reported on 09/04/2023) 30 patch 0  No current facility-administered medications for this visit.   Facility-Administered Medications Ordered in Other Visits  Medication Dose Route Frequency Provider Last Rate Last Admin   acetaminophen (TYLENOL) tablet 650 mg  650 mg Oral Once Jerene Bears, MD       diphenhydrAMINE (BENADRYL) capsule 25 mg  25 mg Oral Once Jerene Bears, MD       iron sucrose (VENOFER) injection 200 mg  200 mg Intravenous Once Jerene Bears, MD       sodium chloride 0.9 % bolus 250 mL  250 mL Intravenous Once Jerene Bears, MD        ROS: Pertinent items noted in HPI and remainder of comprehensive ROS otherwise negative.  Exam:   BP 133/76 (BP Location: Right Arm, Patient Position: Sitting, Cuff Size: Large)   Pulse 89   Ht 5\' 7"  (1.702 m)   Wt  (!) 315 lb 12.8 oz (143.2 kg)   BMI 49.46 kg/m   Physical Exam Constitutional:      Appearance: Normal appearance.  Neurological:     General: No focal deficit present.     Mental Status: She is alert.  Psychiatric:        Mood and Affect: Mood normal.     Assessment/Plan: 1. Labial hypertrophy (Primary) - for now, she wants to wait on surgery.  Will let scheduling know.  2. Adjustment disorder with anxious mood - Ambulatory referral to Integrated Behavioral Health  3. STD exposure - RPR+HBsAg+HIV - Hepatitis C antibody - Cervicovaginal ancillary only  4. Iron deficiency anemia due to chronic blood loss - will plan to repeat lab work in May.  Future orders placed.    Total time with pt:  33 minutes Documentation time:  5 minutes Total time: 38 minutes

## 2023-09-05 ENCOUNTER — Encounter (HOSPITAL_BASED_OUTPATIENT_CLINIC_OR_DEPARTMENT_OTHER): Payer: Self-pay | Admitting: Obstetrics & Gynecology

## 2023-09-05 LAB — CERVICOVAGINAL ANCILLARY ONLY
Chlamydia: NEGATIVE
Comment: NEGATIVE
Comment: NEGATIVE
Comment: NORMAL
Neisseria Gonorrhea: NEGATIVE
Trichomonas: NEGATIVE

## 2023-09-05 LAB — RPR+HBSAG+HIV
HIV Screen 4th Generation wRfx: NONREACTIVE
Hepatitis B Surface Ag: NEGATIVE
RPR Ser Ql: NONREACTIVE

## 2023-09-05 LAB — HEPATITIS C ANTIBODY: Hep C Virus Ab: NONREACTIVE

## 2023-09-13 NOTE — BH Specialist Note (Signed)
 Integrated Behavioral Health via Telemedicine Visit  09/27/2023 [name] [id number]  Number of Integrated Behavioral Health Clinician visits: 1- Initial Visit  Session Start time: [phone]   Session End time: 1000  Total time in minutes: 38 Video cut off during visit; Left HIPPA-compliant message to call back Asher Muir from [employer] for [employer] at [facility] at  [phone] [facility] office).    Referring Provider: Valentina Shaggy, MD Patient/Family location: Home [facility] Provider location: Center for [facility] Healthcare at [facility]  All persons participating in visit: Patient Deborah Little and [facility] Casin Federici   Types of Service: Individual psychotherapy and Video visit  I connected with Olive S Brosky and/or Reizel S Engh's  n/a  via  Telephone or Video Enabled Telemedicine Application  (Video is Caregility application) and verified that I am speaking with the correct person using two identifiers. Discussed confidentiality: Yes   I discussed the limitations of telemedicine and the availability of in person appointments.  Discussed there is a possibility of technology failure and discussed alternative modes of communication if that failure occurs.  I discussed that engaging in this telemedicine visit, they consent to the provision of behavioral healthcare and the services will be billed under their insurance.  Patient and/or legal guardian expressed understanding and consented to Telemedicine visit: Yes   Presenting Concerns: Patient and/or family reports the following symptoms/concerns: Anger, sleep difficulty, fatigue, poor concentration, depression, worry, anxiety, irritability; Attributes symptoms to grief (grandpa; aunt; friends), history of trauma (home break-in), seasonal changes, life stress(mom's recent cancer diagnosis). Pt has tried many medication that have not worked well in the past (Xanax worked best "mellowed me out").  Pt manages mood instability best with massages, prayer, swimming,and staying busy; has about "four episodes per year" of depression where she gets "trapped in the lows". Pt is open to referral to psychiatry.  Duration of problem: Ongoing; Severity of problem:  moderately severe  Patient and/or Family's Strengths/Protective Factors: Social connections and Sense of purpose  Goals Addressed: Patient will:  Reduce symptoms of: anxiety, depression, and mood instability    Demonstrate ability to: Increase healthy adjustment to current life circumstances and Increase motivation to adhere to plan of care  Progress towards Goals: Ongoing  Interventions: Interventions utilized:  Supportive Reflection Standardized Assessments completed: GAD-7 and PHQ 9  Patient and/or Family Response: Patient agrees with treatment plan.   Assessment: Patient currently experiencing Mood disorder, unspecified.   Patient may benefit from psychoeducation and brief therapeutic interventions regarding coping with symptoms of anxiety, depression .  Plan: Follow up with behavioral health clinician on : Three weeks; Call Rocsi Hazelbaker at [phone], as needed. Behavioral recommendations:  -Accept referral to psychiatry for ongoing BH medication management -Continue using self-coping strategies that have remained helpful in managing mood (swimming, massage, prayer) -Read through information on After Visit Summary; use as needed and discussed  Referral(s): Integrated [employer] (In Clinic) and Psychiatrist  I discussed the assessment and treatment plan with the patient and/or parent/guardian. They were provided an opportunity to ask questions and all were answered. They agreed with the plan and demonstrated an understanding of the instructions.   They were advised to call back or seek an in-person evaluation if the symptoms worsen or if the condition fails to improve as anticipated.  Rae Lips,  LCSW     09/27/2023    9:41 AM 07/31/2023   10:47 AM 07/03/2023    9:34 AM 03/20/2023  3:26 PM 11/01/2022    1:42 PM  Depression screen PHQ 2/9  Decreased Interest 1 3 0 0 0  Down, Depressed, Hopeless 1 3 0 0 0  PHQ - 2 Score 2 6 0 0 0  Altered sleeping 3 3     Tired, decreased energy 3 3     Change in appetite 3 3     Feeling bad or failure about yourself  1 3     Trouble concentrating 3 3     Moving slowly or fidgety/restless 0 2     Suicidal thoughts 0 0     PHQ-9 Score 15 23     Difficult doing work/chores  Very difficult         09/27/2023    9:40 AM 06/21/2022    3:19 PM 04/27/2022    2:48 PM 04/20/2021    4:24 PM  GAD 7 : Generalized Anxiety Score  Nervous, Anxious, on Edge 2 0 0 1  Control/stop worrying 2 0 0 1  Worry too much - different things 3 0 0 1  Trouble relaxing 1 0 0 1  Restless 2 0 0 1  Easily annoyed or irritable 3 0 0 1  Afraid - awful might happen 1 0 0 1  Total GAD 7 Score 14 0 0 7

## 2023-09-17 ENCOUNTER — Encounter: Payer: Self-pay | Admitting: Obstetrics & Gynecology

## 2023-09-17 ENCOUNTER — Inpatient Hospital Stay (HOSPITAL_COMMUNITY): Admission: RE | Admit: 2023-09-17 | Source: Home / Self Care | Admitting: Obstetrics & Gynecology

## 2023-09-17 ENCOUNTER — Encounter (HOSPITAL_COMMUNITY): Admission: RE | Payer: Self-pay | Source: Home / Self Care

## 2023-09-17 SURGERY — VULVECTOMY, PARTIAL
Anesthesia: Choice

## 2023-09-27 ENCOUNTER — Ambulatory Visit (INDEPENDENT_AMBULATORY_CARE_PROVIDER_SITE_OTHER): Admitting: Clinical

## 2023-09-27 DIAGNOSIS — F39 Unspecified mood [affective] disorder: Secondary | ICD-10-CM | POA: Diagnosis not present

## 2023-09-27 NOTE — Patient Instructions (Addendum)

## 2023-10-04 NOTE — BH Specialist Note (Signed)
 Integrated Behavioral Health via Telemedicine Visit  10/18/2023 BRANDYN STEINERT [id number]  Number of Integrated Behavioral Health Clinician visits: 2- Second Visit  Session Start time: 1001   Session End time: 1035  Total time in minutes: 34  Referring Provider: Amiel Balder, MD Patient/Family location: Home [facility] Provider location: Center for [facility] Healthcare at [facility] for [facility]  All persons participating in visit: Patient Deborah Little and [facility] Leelynd Maldonado   Types of Service: Individual psychotherapy and Telephone visit  I connected with Azizah S Delancey and/or Eunice S Scalera's  n/a  via  Telephone or Video Enabled Telemedicine Application  (Video is Caregility application) and verified that I am speaking with the correct person using two identifiers. Discussed confidentiality: Yes   I discussed the limitations of telemedicine and the availability of in person appointments.  Discussed there is a possibility of technology failure and discussed alternative modes of communication if that failure occurs.  I discussed that engaging in this telemedicine visit, they consent to the provision of behavioral healthcare and the services will be billed under their insurance.  Patient and/or legal guardian expressed understanding and consented to Telemedicine visit: Yes   Presenting Concerns: Patient and/or family reports the following symptoms/concerns: Sleeping all day(about 9am-5pm, after taking hydroxyzine) awake all night, fatigue and low motivation negatively affecting ability to work and function; pt's goal is to switch to sleeping at night, and be able to get back to her normal activities.  Duration of problem: Ongoing; Severity of problem:  moderately severe  Patient and/or Family's Strengths/Protective Factors: Social connections, Concrete supports in place (healthy food, safe environments, etc.), and Sense of purpose  Goals Addressed: Patient will:  Reduce  symptoms of: anxiety, depression, insomnia, and mood instability   Increase knowledge and/or ability of: healthy habits and self-management skills   Demonstrate ability to: Increase healthy adjustment to current life circumstances and Increase motivation to adhere to plan of care  Progress towards Goals: Ongoing  Interventions: Interventions utilized:  Solution-Focused Strategies and Mindfulness or Relaxation Training Standardized Assessments completed: Not Needed  Patient and/or Family Response: Patient agrees with treatment plan.   Assessment: Patient currently experiencing Mood disorder, unspecified.   Patient may benefit from continued therapeutic intervention  .  Plan: Follow up with behavioral health clinician on : One month; Call Edwin Baines at [phone], as needed. Behavioral recommendations:  -Continue accepting referral to psychiatry (make sure voicemail is set up and not full to receive call to schedule initial appointment) -Begin spending 5-20 minutes outdoors every morning (on non-stormy/non-rainy days) -Stay awake today with planned activities (errands, etc.) -Continue taking hydroxyzine as prescribed by PCP (take at evening bedtime, instead of morning) -CALM relaxation breathing exercise twice daily (morning; at bedtime with sleep sounds); as needed throughout the day.  Referral(s): Integrated [employer] (In Clinic)  I discussed the assessment and treatment plan with the patient and/or parent/guardian. They were provided an opportunity to ask questions and all were answered. They agreed with the plan and demonstrated an understanding of the instructions.   They were advised to call back or seek an in-person evaluation if the symptoms worsen or if the condition fails to improve as anticipated.  Georgia Kipper, LCSW     09/27/2023    9:41 AM 07/31/2023   10:47 AM 07/03/2023    9:34 AM 03/20/2023    3:26 PM 11/01/2022    1:42 PM  Depression screen PHQ  2/9  Decreased Interest 1 3 0 0 0  Down, Depressed, Hopeless 1 3 0 0 0  PHQ - 2 Score 2 6 0 0 0  Altered sleeping 3 3     Tired, decreased energy 3 3     Change in appetite 3 3     Feeling bad or failure about yourself  1 3     Trouble concentrating 3 3     Moving slowly or fidgety/restless 0 2     Suicidal thoughts 0 0     PHQ-9 Score 15 23     Difficult doing work/chores  Very difficult         09/27/2023    9:40 AM 06/21/2022    3:19 PM 04/27/2022    2:48 PM 04/20/2021    4:24 PM  GAD 7 : Generalized Anxiety Score  Nervous, Anxious, on Edge 2 0 0 1  Control/stop worrying 2 0 0 1  Worry too much - different things 3 0 0 1  Trouble relaxing 1 0 0 1  Restless 2 0 0 1  Easily annoyed or irritable 3 0 0 1  Afraid - awful might happen 1 0 0 1  Total GAD 7 Score 14 0 0 7

## 2023-10-18 ENCOUNTER — Ambulatory Visit: Admitting: Clinical

## 2023-10-18 DIAGNOSIS — F39 Unspecified mood [affective] disorder: Secondary | ICD-10-CM

## 2023-10-18 NOTE — Patient Instructions (Signed)
 Center for [facility] Healthcare at [facility] for Women [address] [place], [place] [address] [phone] (main office) [phone] (Konner Saiz's office)

## 2023-10-22 ENCOUNTER — Telehealth: Payer: Self-pay | Admitting: Clinical

## 2023-10-22 ENCOUNTER — Encounter (HOSPITAL_BASED_OUTPATIENT_CLINIC_OR_DEPARTMENT_OTHER): Payer: Self-pay | Admitting: Obstetrics & Gynecology

## 2023-10-22 DIAGNOSIS — F39 Unspecified mood [affective] disorder: Secondary | ICD-10-CM

## 2023-10-22 NOTE — Telephone Encounter (Signed)
 Returned patient's call, as she has not heard back from psychiatry referral. Pt is showing "no coverage" on her insurance on our side, says she has [employer] via her workplace. Pt is encouraged to make sure she brings her insurance card to any of her [facility] appointments (or walk-in at front desk) to make sure her insurance is in the system, as that may impact where she is able to get in to see psychiatry.   Pt is made aware that if she has lost her workplace coverage, for any reason, and is now uninsured, she can use walk-in hours at [facility] to establish care with psychiatry for outpatient on the second floor (not urgent BH care on the first floor). Pt will check her insurance coverage and is aware that referrals have been placed for two locations: one for if she has private insurance; another if she is now [employer].   Pt agrees that she will do walk-in at [facility] if she finds that she is no longer covered by work insurance due to a lapse in work; will wait to hear back from psychiatry office to schedule appointment if needed.

## 2023-10-25 NOTE — BH Specialist Note (Signed)
 Integrated Behavioral Health via Telemedicine Visit  11/08/2023 Deborah Little [id number]  Number of Integrated Behavioral Health Clinician visits: 3- Third Visit  Session Start time: 0845   Session End time: 0905  Total time in minutes: 20   Referring Provider: Amiel Balder, MD Patient[facility] Provider location: Center for [facility] at [facility]  All persons participating in visit: Patient Deborah Little and Deborah Little   Types of Service: Individual psychotherapy and Video visit  I connected with Deborah Little and/or Deborah Little's n/a via  Telephone or Video Enabled Telemedicine Application  (Video is Caregility application) and verified that I am speaking with the correct person using two identifiers. Discussed confidentiality: Yes   I discussed the limitations of telemedicine and the availability of in person appointments.  Discussed there is a possibility of technology failure and discussed alternative modes of communication if that failure occurs.  I discussed that engaging in this telemedicine visit, they consent to the provision of behavioral healthcare and the services will be billed under their insurance.  Patient and/or legal guardian expressed understanding and consented to Telemedicine visit: Yes   Presenting Concerns: Patient and/or family reports the following symptoms/concerns: Increasing depression, anxiety, sleeping all day; has not been able to work in many weeks; felt good to be outdoors [place] recently; pt unable to begin self-coping strategies,  has psychiatry appointment scheduled on Saturday, plans to obtain a gym membership and work out after starting medication and going back to work.  Duration of problem: Ongoing; Severity of problem: severe  Patient and/or Family's Strengths/Protective Factors: Social connections, Concrete supports in place (healthy food, safe environments, etc.), and  Sense of purpose  Goals Addressed: Patient will:  Reduce symptoms of: anxiety, depression, and mood instability   Increase knowledge and/or ability of: healthy habits   Demonstrate ability to: Increase healthy adjustment to current life circumstances, Increase adequate support systems for patient/family, and Increase motivation to adhere to plan of care  Progress towards Goals: Ongoing  Interventions: Interventions utilized:  Motivational Interviewing and Supportive Reflection Standardized Assessments completed: GAD-7 and PHQ 9  Patient and/or Family Response: Patient agrees with treatment plan.   Assessment: Patient currently experiencing Mood disorder, unspecified.   Patient may benefit from continued therapeutic intervention.   Plan: Follow up with behavioral health clinician on : Call Dantre Yearwood at [phone], as needed. Behavioral recommendations:  -Continue taking hydroxyzine as prescribed; discuss any changes with psychiatry -Continue plan to attend upcoming initial psychiatry appointment on 11/10/23 -Consider making plans to go [place] to the [place]; consider implementing self-coping strategies as able (outdoor time, breathing exercises, switching from daytime to nighttime sleep) Referral(s): Integrated [employer] (In Clinic)  I discussed the assessment and treatment plan with the patient and/or parent/guardian. They were provided an opportunity to ask questions and all were answered. They agreed with the plan and demonstrated an understanding of the instructions.   They were advised to call back or seek an in-person evaluation if the symptoms worsen or if the condition fails to improve as anticipated.  Georgia Kipper, LCSW     09/27/2023    9:41 AM 07/31/2023   10:47 AM 07/03/2023    9:34 AM 03/20/2023    3:26 PM 11/01/2022    1:42 PM  Depression screen PHQ 2/9  Decreased Interest 1 3 0 0 0  Down, Depressed, Hopeless 1 3 0 0 0  PHQ - 2 Score 2 6 0 0  0   Altered sleeping 3 3     Tired, decreased energy 3 3     Change in appetite 3 3     Feeling bad or failure about yourself  1 3     Trouble concentrating 3 3     Moving slowly or fidgety/restless 0 2     Suicidal thoughts 0 0     PHQ-9 Score 15 23     Difficult doing work/chores  Very difficult         09/27/2023    9:40 AM 06/21/2022    3:19 PM 04/27/2022    2:48 PM 04/20/2021    4:24 PM  GAD 7 : Generalized Anxiety Score  Nervous, Anxious, on Edge 2 0 0 1  Control/stop worrying 2 0 0 1  Worry too much - different things 3 0 0 1  Trouble relaxing 1 0 0 1  Restless 2 0 0 1  Easily annoyed or irritable 3 0 0 1  Afraid - awful might happen 1 0 0 1  Total GAD 7 Score 14 0 0 7

## 2023-10-29 ENCOUNTER — Other Ambulatory Visit (HOSPITAL_BASED_OUTPATIENT_CLINIC_OR_DEPARTMENT_OTHER)

## 2023-10-29 ENCOUNTER — Encounter: Payer: Self-pay | Admitting: Obstetrics & Gynecology

## 2023-10-29 ENCOUNTER — Other Ambulatory Visit (HOSPITAL_COMMUNITY): Payer: Self-pay | Admitting: Family Medicine

## 2023-10-29 DIAGNOSIS — D500 Iron deficiency anemia secondary to blood loss (chronic): Secondary | ICD-10-CM

## 2023-10-30 LAB — CBC
Hematocrit: 39 % (ref 34.0–46.6)
Hemoglobin: 12 g/dL (ref 11.1–15.9)
MCH: 26.7 pg (ref 26.6–33.0)
MCHC: 30.8 g/dL — ABNORMAL LOW (ref 31.5–35.7)
MCV: 87 fL (ref 79–97)
Platelets: 335 10*3/uL (ref 150–450)
RBC: 4.49 x10E6/uL (ref 3.77–5.28)
RDW: 15.9 % — ABNORMAL HIGH (ref 11.7–15.4)
WBC: 8.2 10*3/uL (ref 3.4–10.8)

## 2023-10-30 LAB — IRON,TIBC AND FERRITIN PANEL
Ferritin: 34 ng/mL (ref 15–150)
Iron Saturation: 15 % (ref 15–55)
Iron: 48 ug/dL (ref 27–159)
Total Iron Binding Capacity: 320 ug/dL (ref 250–450)
UIBC: 272 ug/dL (ref 131–425)
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
## 2023-11-08 ENCOUNTER — Ambulatory Visit: Admitting: Clinical

## 2023-11-08 DIAGNOSIS — F39 Unspecified mood [affective] disorder: Secondary | ICD-10-CM | POA: Diagnosis not present

## 2023-11-08 NOTE — Patient Instructions (Signed)
 Center for [facility] Healthcare at [facility] for Women [address] [place], [place] [address] [phone] (main office) [phone] (Konner Saiz's office)

## 2023-11-10 ENCOUNTER — Encounter (HOSPITAL_COMMUNITY): Payer: Self-pay | Admitting: Psychiatry

## 2023-11-10 ENCOUNTER — Telehealth (HOSPITAL_COMMUNITY): Admitting: Psychiatry

## 2023-11-10 DIAGNOSIS — F1290 Cannabis use, unspecified, uncomplicated: Secondary | ICD-10-CM

## 2023-11-10 DIAGNOSIS — Z9189 Other specified personal risk factors, not elsewhere classified: Secondary | ICD-10-CM

## 2023-11-10 DIAGNOSIS — F332 Major depressive disorder, recurrent severe without psychotic features: Secondary | ICD-10-CM | POA: Insufficient documentation

## 2023-11-10 MED ORDER — DULOXETINE HCL 20 MG PO CPEP: 20.0000 mg | ORAL_CAPSULE | Freq: Every day | ORAL | 1 refills | Status: DC

## 2023-11-10 NOTE — Patient Instructions (Signed)
 Thank you for attending your appointment today.  -- START Cymbalta 20 mg daily -- Continue other medications as prescribed.  Please do not make any changes to medications without first discussing with your provider. If you are experiencing a psychiatric emergency, please call 911 or present to your nearest emergency department. Additional crisis, medication management, and therapy resources are included below.  [facility]  [address], [place], [place] [address] [phone] WALK-IN URGENT CARE 24/7 FOR ANYONE [address], [place], [place]  [phone] Fax: [phone] [email or web address] *Interpreters available *Accepts all insurance and uninsured for Urgent Care needs *Accepts Medicaid and uninsured for outpatient treatment (below)      ONLY FOR [facility]  Below:    Outpatient New Patient Assessment/Therapy Walk-ins:        Monday, Wednesday, and Thursday 8am until slots are full (first come, first served)                   New Patient Psychiatry/Medication Management        Monday-Friday 8am-11am (first come, first served)               For all walk-ins we ask that you arrive by 7:15am, because patients will be seen in the order of arrival.

## 2023-11-13 ENCOUNTER — Telehealth (HOSPITAL_COMMUNITY): Payer: Self-pay | Admitting: Psychiatry

## 2023-11-13 NOTE — Telephone Encounter (Signed)
 D:  Dr.Bahraini referred pt to virtual MH-IOP.  A:  Placed call and oriented pt.  Answered all her questions.  Encouraged pt to verify her insurance benefits.  CCA scheduled for tomorrow at 9:30 a.m.; start MH-IOP on 11-15-23 @ 9 a.m.  R:  Pt receptive.

## 2023-11-14 ENCOUNTER — Other Ambulatory Visit (HOSPITAL_BASED_OUTPATIENT_CLINIC_OR_DEPARTMENT_OTHER): Payer: Self-pay

## 2023-11-14 ENCOUNTER — Encounter (HOSPITAL_COMMUNITY): Payer: Self-pay

## 2023-11-14 ENCOUNTER — Telehealth (HOSPITAL_COMMUNITY): Payer: Self-pay | Admitting: Psychiatry

## 2023-11-14 ENCOUNTER — Other Ambulatory Visit (HOSPITAL_COMMUNITY): Attending: Psychiatry | Admitting: Psychiatry

## 2023-11-14 MED ORDER — VALACYCLOVIR HCL 500 MG PO TABS: 500.0000 mg | ORAL_TABLET | Freq: Every day | ORAL | 0 refills | Status: DC

## 2023-11-14 NOTE — Telephone Encounter (Signed)
 D:  Pt was scheduled for a CCA this morning with the case manager @ 9:30, but she no showed and didn't call.  Case manager waited online for 20 minutes.  A:  Placed call to pt, but there was no answer.  Inform Dr. Eligio Grumbling.

## 2023-11-15 ENCOUNTER — Telehealth (HOSPITAL_COMMUNITY): Payer: Self-pay | Admitting: Psychiatry

## 2023-11-15 ENCOUNTER — Ambulatory Visit (HOSPITAL_COMMUNITY)

## 2023-11-15 NOTE — Telephone Encounter (Signed)
 D:  Pt called and left vm stating she was asleep yesterday and is ready for her assessment today and to start MH-IOP.  A:  Placed call to pt but there was no answer.  Left vm letting her know that her assessment would need to be rescheduled and to give the case mgr a call back.

## 2023-11-16 ENCOUNTER — Ambulatory Visit (HOSPITAL_COMMUNITY)

## 2023-11-19 ENCOUNTER — Telehealth (HOSPITAL_BASED_OUTPATIENT_CLINIC_OR_DEPARTMENT_OTHER): Payer: Self-pay

## 2023-11-19 ENCOUNTER — Other Ambulatory Visit (HOSPITAL_COMMUNITY)

## 2023-11-19 NOTE — Telephone Encounter (Signed)
 [employer] company was calling to verify that we received paperwork for [employer]. They state that the paperwork was faxed to our office on Nov 05, 2023. If so they would like a call back at [phone]. Use Reference [id number].

## 2023-11-20 ENCOUNTER — Ambulatory Visit (HOSPITAL_COMMUNITY)

## 2023-11-21 ENCOUNTER — Ambulatory Visit (HOSPITAL_COMMUNITY)

## 2023-11-22 ENCOUNTER — Ambulatory Visit (HOSPITAL_COMMUNITY)

## 2023-11-23 ENCOUNTER — Ambulatory Visit (HOSPITAL_COMMUNITY)

## 2023-11-26 ENCOUNTER — Ambulatory Visit (HOSPITAL_COMMUNITY)

## 2023-11-27 ENCOUNTER — Ambulatory Visit (HOSPITAL_COMMUNITY)

## 2023-11-28 ENCOUNTER — Ambulatory Visit (HOSPITAL_COMMUNITY)

## 2023-11-29 ENCOUNTER — Ambulatory Visit (HOSPITAL_COMMUNITY)

## 2023-11-30 ENCOUNTER — Ambulatory Visit (HOSPITAL_COMMUNITY)

## 2023-12-03 ENCOUNTER — Ambulatory Visit (HOSPITAL_COMMUNITY)

## 2023-12-04 ENCOUNTER — Ambulatory Visit (HOSPITAL_COMMUNITY)

## 2023-12-05 ENCOUNTER — Ambulatory Visit (HOSPITAL_COMMUNITY)

## 2023-12-05 ENCOUNTER — Ambulatory Visit (HOSPITAL_BASED_OUTPATIENT_CLINIC_OR_DEPARTMENT_OTHER): Payer: Self-pay | Admitting: Obstetrics & Gynecology

## 2023-12-06 ENCOUNTER — Ambulatory Visit (HOSPITAL_COMMUNITY)

## 2023-12-14 ENCOUNTER — Telehealth (HOSPITAL_BASED_OUTPATIENT_CLINIC_OR_DEPARTMENT_OTHER): Payer: Self-pay

## 2023-12-14 NOTE — Telephone Encounter (Signed)
 Patient called and left a message wanting to know if we could give her a return to work note without restrictions. Please advise.

## 2023-12-17 NOTE — Telephone Encounter (Signed)
 Deborah Little asked if I could get note written for her.  I needed a RTW date verification. The patient was called today.  She states she will get a letter at her visit on 12/19/23. Sotero CMA

## 2023-12-19 ENCOUNTER — Encounter (HOSPITAL_BASED_OUTPATIENT_CLINIC_OR_DEPARTMENT_OTHER): Payer: Self-pay | Admitting: Obstetrics & Gynecology

## 2023-12-19 ENCOUNTER — Ambulatory Visit (HOSPITAL_BASED_OUTPATIENT_CLINIC_OR_DEPARTMENT_OTHER): Admitting: Obstetrics & Gynecology

## 2023-12-19 ENCOUNTER — Encounter: Payer: Self-pay | Admitting: Obstetrics & Gynecology

## 2023-12-19 VITALS — BP 149/99 | HR 86 | Wt 312.6 lb

## 2023-12-19 DIAGNOSIS — F325 Major depressive disorder, single episode, in full remission: Secondary | ICD-10-CM | POA: Diagnosis not present

## 2023-12-19 NOTE — Progress Notes (Signed)
 GYNECOLOGY  VISIT  CC:   follow up, h/o adjustment disorder  HPI: 38 y.o. G4P1001 Single Black or [place] American female here for follow up and to discuss returning to work.  She has been seeing both integrative Behavioral health provider [facility] and Dr. Mercy, psychiatry, who has pt on Cymbalta .  Reports she is feeling so much better.    PHQ 9 and GAD 7 repeated today.  These are both improved.  As I did her FMLA for temporary time away from work so I need to complete her return to work note.  She does feel she is ready to return full time and without restrictions as soon as possible.       12/19/2023   11:41 AM 09/27/2023    9:41 AM 07/31/2023   10:47 AM  PHQ9 SCORE ONLY  PHQ-9 Total Score 3 15 23         12/19/2023   11:40 AM 09/27/2023    9:40 AM 06/21/2022    3:19 PM 04/27/2022    2:48 PM  GAD 7 : Generalized Anxiety Score  Nervous, Anxious, on Edge 1 2 0 0  Control/stop worrying 1 2 0 0  Worry too much - different things 0 3 0 0  Trouble relaxing 0 1 0 0  Restless 0 2 0 0  Easily annoyed or irritable 1 3 0 0  Afraid - awful might happen 0 1 0 0  Total GAD 7 Score 3 14 0 0  Anxiety Difficulty Not difficult at all        Past Medical History:  Diagnosis Date   Anemia    Anxiety    Bilateral swelling of feet and ankles    Biliary dyskinesia 02/2018   Chest pain    Depression    Fatty liver    Fish allergy     Gallbladder problem    Hyperlipidemia    Joint pain    Obesity    Osteoarthritis    Other fatigue    Prediabetes    Shortness of breath    Shortness of breath on exertion    Sleep apnea    Swallowing difficulty    Vitamin D  deficiency     MEDS:   Current Outpatient Medications on File Prior to Visit  Medication Sig Dispense Refill   cholecalciferol (VITAMIN D3) 25 MCG (1000 UNIT) tablet Take 1,000 Units by mouth daily.     DULoxetine  (CYMBALTA ) 20 MG capsule Take 1 capsule (20 mg total) by mouth daily. 30 capsule 1   hydrOXYzine (ATARAX) 25 MG  tablet Take 25 mg by mouth 2 (two) times daily as needed for anxiety (sleep).     valACYclovir  (VALTREX ) 500 MG tablet Take 1 tablet (500 mg total) by mouth daily. 90 tablet 0   Current Facility-Administered Medications on File Prior to Visit  Medication Dose Route Frequency Provider Last Rate Last Admin   acetaminophen  (TYLENOL ) tablet 650 mg  650 mg Oral Once Cleotilde Ronal RAMAN, MD       diphenhydrAMINE  (BENADRYL ) capsule 25 mg  25 mg Oral Once Cleotilde Ronal RAMAN, MD       iron  sucrose (VENOFER ) injection 200 mg  200 mg Intravenous Once Cleotilde Ronal RAMAN, MD       sodium chloride  0.9 % bolus 250 mL  250 mL Intravenous Once Cleotilde Ronal RAMAN, MD        ALLERGIES: Fish allergy , Hydrocodone , and Penicillins  SH:  single, non smoker  Review of Systems  Constitutional:  Negative.   Genitourinary: Negative.     PHYSICAL EXAMINATION:    BP (!) 149/99 (BP Location: Left Arm, Patient Position: Sitting, Cuff Size: Large)   Pulse 86   Wt (!) 312 lb 9.6 oz (141.8 kg)   SpO2 99%   BMI 48.96 kg/m     Physical Exam Constitutional:      Appearance: Normal appearance.  Neurological:     General: No focal deficit present.  Psychiatric:        Mood and Affect: Mood normal.        Behavior: Behavior normal.    Assessment/Plan: 1. Major depressive disorder with single episode, in full remission (HCC) (Primary) - she will continue Cymbalta  20mg  daily and follow up with DR. Bahraini - note for full return to work written for pt today  - follow up 6 months for routine gyn exam  Total time with pt: 22 minutes Documentation time:  5 minutes Total time:  27 minutes

## 2023-12-25 ENCOUNTER — Other Ambulatory Visit (HOSPITAL_BASED_OUTPATIENT_CLINIC_OR_DEPARTMENT_OTHER): Payer: Self-pay | Admitting: Obstetrics & Gynecology

## 2023-12-25 NOTE — Progress Notes (Unsigned)
 BH MD Outpatient Progress Note  12/26/2023 9:37 AM Deborah Little  MRN:  [id number]  Assessment:  Deborah Little presents for follow-up evaluation. Today, 12/26/23, patient reports improvement in her mood since starting cymbalta . She reports feeling more energy, less pain, and feeling less depressed and unmotivated. We discussed increasing her cymbalta  for a regular starting dose of cymbalta . We also obtained repeat labs to assess for any nutritional deficiencies that could have contributed to a change in her mood. In addition, we discussed her ongoing alcohol use. Discussed replacing alcohol with trazodone  to try to help with her sleep, her previous sleep study was unremarkable for OSA. She has been abstaining from cannabis for 2 months and provided supportive encouragement regarding cannabis cessation and continued to encourage abstinence from cannabis. With regards to her elevated blood pressure, she reports that she is monitoring this at home and her PCP is following.   Identifying Information: Deborah Little is a 38 y.o. female with a history of depression, prediabetes, osteoarthritis, fibroids, iron  deficiency anemia, and Vitamin D  deficiency who is an established patient with [facility] for management of depression.   Plan:  # MDD, in partial remission  Past medication trials: Lamictal (tearfulness), Depakote (more sad), Abilify (jittery, fatigued), Vraylar (can't remember - may not have taken), Xanax, Lyrica , Atarax  Status of problem: ongoing Interventions: -- Increase cymbalta  30mg  daily -- Start trazodone  50mg  at bedtime PRN for insomnia  -- Continue Atarax 25 mg BID PRN anxiety/sleep  -- Start therapy with this provider July 14th 8am.  -- R/o contributing medical conditions:             -- CBC and iron  panel 10/29/23 wnl             -- repeat vitamin D , B12, folate              -- repeat TSH   # Alcohol use  #Cannabis use Past medication trials: none   Status of problem: ongoing Interventions: -- continue to encourage cessation  Patient was given contact information for behavioral health clinic and was instructed to call 911 for emergencies.   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 Feels like her mood can change in response to triggers but denies manic / hypomanic symptoms. Reports that is also helps with her pain. She feels like a different person on the medication. Reports she sleeps 5-6 hours each night. No issues with appetite. No GI distress or other medication side effects. Reports she wasn't as comfortable with group therapy in the intensive outpatient program and had issues with waking up for IOP. She is interested in individual therapy and offered patient options with this provider on CBT or with another provider and she was open to receiving CBT with this provider. Reports she has not been taking the cannabis due to not feeling as down. Reports she is drinking every other night 2-3 shots or 2-3 glasses of wine to feel calm and to help with sleep. Has tried sleep gummies, melatonin for sleep before. Reports she doesn't like how the atarax makes her feel (very groggy in the AM) but it does help with sleep. Feel like the cannabis makes her overthink. We discussed increasing  her cymbalta  to usual starting dose of 30mg  and trying trazodone  to help with her sleep instead of alcohol. At times she does have limited insight, stating that she doesn't want to be on medication long-term but also concurrently asking about xanax and using alcohol. This provider provided psychoeducation on the risks of long-term benzodiazepine prescription. She was in agreement with this plan.   PHQ-9: 3 (issues  with sleep) GAD-7: 6 (1 for all items except item 5)  Visit Diagnosis:    ICD-10-CM   1. At risk alcohol consumption  Z91.89 Vitamin D  (25 hydroxy)    B12 and Folate Panel    B12 and Folate Panel    Vitamin D  (25 hydroxy)    2. Severe episode of recurrent major depressive disorder, without psychotic features (HCC)  F33.2 traZODone  (DESYREL ) 50 MG tablet    DULoxetine  (CYMBALTA ) 30 MG capsule    TSH    TSH    3. Recurrent major depressive disorder, in partial remission (HCC)  F33.41     4. Cannabis use disorder, mild, in early remission  F12.11       Past Psychiatric History:  Diagnoses: MDD vs. Bipolar disorder, cannabis use, PTSD, anxiety  Medication trials:   Current medication: cymbalta  20mg    Past medications: tramadol , modafinil , lyrica , buspar, celexa , atarax. Lamictal (tearfulness), Depakote (more sad), Abilify (jittery, fatigued), Vraylar (can't remember - may not have taken), Xanax, Lyrica , Atarax  Previous psychiatrist/therapist: Dr. Mercy initial, seen at [facility] ago; currently seeing therapist through Ob/gyn  Hospitalizations: denies, went to [facility] ED 07/2023 for increased depressive symptoms.  Suicide attempts: denies SIB: denies Hx of violence towards others: denies Current access to guns:  yes - counseled on proper storage and safety; consideration of removal if depressive symptoms worsen (denies active SI or HI)  Hx of trauma/abuse: denies Substance use:   --Edibles: 300 mg a few times per month; denies other forms of CBD/THC             -- Denies use of stimulants, opioids, BZDs, hallucinogens             -- Denies use of tobacco             -- Etoh: every other day - 3-4 drinks at a time                         -- Denies history of withdrawal  Past Medical History:  Past Medical History:  Diagnosis Date   Anemia    Anxiety    Bilateral swelling of feet and ankles    Biliary dyskinesia 02/2018   Chest pain    Depression    Fatty liver     Fish allergy     Gallbladder problem    Hyperlipidemia    Joint pain    Obesity    Osteoarthritis    Other fatigue    Prediabetes    Shortness of breath    Shortness of breath on exertion    Sleep apnea    Swallowing difficulty    Vitamin D  deficiency   -- Home sleep study March 2023: only isolated apneic events; recommended to avoid sleeping in supine position; weight loss; sleep hygiene    Past Surgical History:  Procedure Laterality Date   CHOLECYSTECTOMY N/A 03/13/2018   Procedure: LAPAROSCOPIC CHOLECYSTECTOMY;  Surgeon: Vernetta Berg, MD;  Location: [facility];  Service: General;  Laterality: N/A;   GALLBLADDER SURGERY  2018  IR EMBO TUMOR ORGAN ISCHEMIA INFARCT INC GUIDE ROADMAPPING  01/16/2023   IR FLUORO GUIDED NEEDLE PLC ASPIRATION/INJECTION LOC  01/16/2023   IR RADIOLOGIST EVAL & MGMT  12/05/2022   IR RADIOLOGIST EVAL & MGMT  02/01/2023   WISDOM TOOTH EXTRACTION      Family Psychiatric History:  Dad: functional alcoholic Mom: anxiety Sister's son: bipolar disorder  Family History:  Family History  Problem Relation Age of Onset   Obesity Mother    Drug abuse Mother    Sleep apnea Mother    Anxiety disorder Mother    Depression Mother    Hypertension Mother    Breast cancer Mother 75       ATM+   Obesity Father    Drug abuse Father    Alcoholism Father    Hypertension Father    Swallowing difficulties Brother    Lung cancer Maternal Aunt 39   Colon cancer Paternal Aunt 77 - 58   Stroke Maternal Grandmother    Pancreatic cancer Maternal Great-grandmother 64 - 36   Depression Other    Anxiety disorder Other    Sleep apnea Other    Alcoholism Other    Drug abuse Other    Obesity Other    Bipolar disorder Nephew     Social History:  Academic/Vocational: works part time at [employer]. 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   --Considers self a religious person --Has a 104 year old son  Social History   Socioeconomic History   Marital  status: Single    Spouse name: Not on file   Number of children: 1   Years of education: Not on file   Highest education level: Some college, no degree  Occupational History   Occupation: SOCIAL SERVICES    Employer: [employer]  Tobacco Use   Smoking status: Never   Smokeless tobacco: Never  Vaping Use   Vaping status: Never Used  Substance and Sexual Activity   Alcohol use: Yes    Comment: 3-4 drinks 3-4 times weekly   Drug use: Yes    Types: Marijuana    Comment: Edibles a few times monthly   Sexual activity: Yes    Partners: Male    Comment: Yaz  Other Topics Concern   Not on file  Social History Narrative   Lives at home with child   Right handed   Caffeine: 1 cup of coffee a day   Social Drivers of Corporate investment banker Strain: Low Risk  (07/31/2023)   Received from [facility]   Overall Financial Resource Strain (CARDIA)    Difficulty of Paying Living Expenses: Not hard at all  Food Insecurity: No Food Insecurity (07/31/2023)   [facility] from [facility]   Hunger Vital Sign    Within the past 12 months, you worried that your food would run out before you got the money to buy more.: Never true    Within the past 12 months, the food you bought just didn't last and you didn't have money to get more.: Never true  Transportation Needs: No Transportation Needs (07/31/2023)   Received from [facility] - Transportation    Lack of Transportation (Medical): No    Lack of Transportation (Non-Medical): No  Physical Activity: Unknown (04/11/2023)   Received from [facility]   Exercise Vital Sign    On average, how many days per week do you engage in moderate to strenuous exercise (like a brisk walk)?: 0 days  Minutes of Exercise per Session: Not on file  Stress: No Stress Concern Present (04/01/2021)   Received from [facility] of Occupational Health - Occupational Stress Questionnaire    Feeling of Stress : Not at all   Social Connections: Socially Integrated (04/11/2023)   Received from [facility]   Social Network    How would you rate your social network (family, work, friends)?: Good participation with social networks    Allergies:  Allergies  Allergen Reactions   Fish Allergy  Shortness Of Breath   Hydrocodone  Nausea And Vomiting   Penicillins Hives    Current Medications: Current Outpatient Medications  Medication Sig Dispense Refill   DULoxetine  (CYMBALTA ) 30 MG capsule Take 1 capsule (30 mg total) by mouth daily. 30 capsule 1   traZODone  (DESYREL ) 50 MG tablet Take 1 tablet (50 mg total) by mouth at bedtime. 30 tablet 1   cholecalciferol (VITAMIN D3) 25 MCG (1000 UNIT) tablet Take 1,000 Units by mouth daily.     hydrOXYzine (ATARAX) 25 MG tablet Take 25 mg by mouth 2 (two) times daily as needed for anxiety (sleep).     valACYclovir  (VALTREX ) 500 MG tablet Take 1 tablet (500 mg total) by mouth daily. 90 tablet 0   No current facility-administered medications for this visit.   Facility-Administered Medications Ordered in Other Visits  Medication Dose Route Frequency Provider Last Rate Last Admin   acetaminophen  (TYLENOL ) tablet 650 mg  650 mg Oral Once Cleotilde Ronal RAMAN, MD       diphenhydrAMINE  (BENADRYL ) capsule 25 mg  25 mg Oral Once Cleotilde Ronal RAMAN, MD       iron  sucrose (VENOFER ) injection 200 mg  200 mg Intravenous Once Cleotilde Ronal RAMAN, MD       sodium chloride  0.9 % bolus 250 mL  250 mL Intravenous Once Cleotilde Ronal RAMAN, MD        ROS: Review of Systems  Constitutional:  Negative for appetite change and unexpected weight change.  Respiratory:  Negative for shortness of breath.   Cardiovascular:  Negative for chest pain.    Objective:  Psychiatric Specialty Exam: Blood pressure (!) 143/95, pulse 87, weight (!) 310 lb 6.4 oz (140.8 kg).Body mass index is 48.62 kg/m.  General Appearance: Fairly Groomed with leopard glasses  Eye Contact:  Fair  Speech:  Clear and Coherent and  Normal Rate  Volume:  Normal  Mood:  Euthymic  Affect:  Appropriate, bright  Thought Content: WDL   Suicidal Thoughts:  No  Homicidal Thoughts:  No  Thought Process:  Coherent  Orientation:  Full (Time, Place, and Person)    Memory: Grossly intact   Judgment:  Intact  Insight:  Fair  Concentration:  Concentration: Fair  Recall: not formally assessed   Fund of Knowledge: Fair  Language: Good  Psychomotor Activity:  Normal  Akathisia:  No  AIMS (if indicated): not done  Assets:  Communication Skills Desire for Improvement Housing Resilience Social Support Vocational/Educational  ADL's:  Intact  Cognition: WNL  Sleep:  Poor   PE: General: well-appearing; obese; no acute distress  Pulm: no increased work of breathing on room air  Strength & Muscle Tone: within normal limits Neuro: no focal neurological deficits observed  Gait & Station: normal  Metabolic Disorder Labs: Lab Results  Component Value Date   HGBA1C 5.7 (H) 07/03/2023   MPG 114 02/01/2016   MPG 123 (H) 07/09/2014   No results found for: PROLACTIN Lab Results  Component Value  Date   CHOL 159 02/01/2016   TRIG 80 02/01/2016   HDL 35 (L) 02/01/2016   CHOLHDL 4.5 02/01/2016   VLDL 16 02/01/2016   LDLCALC 108 02/01/2016   LDLCALC 108 (H) 07/09/2014   Lab Results  Component Value Date   TSH 0.93 02/26/2018   TSH 0.79 02/01/2016    Therapeutic Level Labs: No results found for: LITHIUM No results found for: VALPROATE No results found for: CBMZ  Screenings:  GAD-7    Flowsheet Row Office Visit from 12/19/2023 in [facility] for [employer] [facility] from 09/27/2023 in Center for [facility] at [facility] Office Visit from 06/21/2022 in Center for [facility] at [facility] Office Visit from 04/27/2022 in Center for [employer] at [facility] Office Visit from  04/20/2021 in [facility] for [facility] at [facility]  Total GAD-7 Score 3 14 0 0 7   PHQ2-9    Flowsheet Row Office Visit from 12/19/2023 in [facility] for [facility] at [facility] from 09/27/2023 in Center for [facility] at [facility] Clinical Support from 07/31/2023 in [facility] at [employer] [facility] Office Visit from 07/03/2023 in [facility] for [facility] at [place] Office Visit from 03/20/2023 in [facility] for [facility] at [facility]  PHQ-2 Total Score 0 2 6 0 0  PHQ-9 Total Score 3 15 23  -- --   Flowsheet Row ED from 08/02/2023 in [facility] ED from 03/28/2023 in [facility] Department at [facility] ED from 03/22/2023 in [facility] Department at [facility]  C-SSRS RISK CATEGORY No Risk No Risk No Risk    Collaboration of Care: Collaboration of Care: Dr. Carvin  Patient/Guardian was advised Release of Information must be obtained prior to any record release in order to collaborate their care with an outside provider. Patient/Guardian was advised if they have not already done so to contact the registration department to sign all necessary forms in order for us  to release information regarding their care.   Consent: Patient/Guardian gives verbal consent for treatment and assignment of benefits for services provided during this visit. Patient/Guardian expressed understanding and agreed to proceed.   Corean Minor, MD, PGY-3 12/26/2023, 9:37 AM

## 2023-12-26 ENCOUNTER — Ambulatory Visit (HOSPITAL_BASED_OUTPATIENT_CLINIC_OR_DEPARTMENT_OTHER): Admitting: Psychiatry

## 2023-12-26 VITALS — BP 143/95 | HR 87 | Wt 310.4 lb

## 2023-12-26 DIAGNOSIS — F1211 Cannabis abuse, in remission: Secondary | ICD-10-CM | POA: Diagnosis not present

## 2023-12-26 DIAGNOSIS — F3341 Major depressive disorder, recurrent, in partial remission: Secondary | ICD-10-CM | POA: Insufficient documentation

## 2023-12-26 DIAGNOSIS — Z9189 Other specified personal risk factors, not elsewhere classified: Secondary | ICD-10-CM | POA: Diagnosis not present

## 2023-12-26 MED ORDER — TRAZODONE HCL 50 MG PO TABS
50.0000 mg | ORAL_TABLET | Freq: Every day | ORAL | 1 refills | Status: AC
Start: 2023-12-26 — End: 2024-02-24

## 2023-12-26 MED ORDER — DULOXETINE HCL 30 MG PO CPEP: 30.0000 mg | ORAL_CAPSULE | Freq: Every day | ORAL | 1 refills | Status: DC

## 2023-12-27 NOTE — Addendum Note (Signed)
 Addended by: CARVIN CROCK on: 12/27/2023 08:29 AM   Modules accepted: Level of Service

## 2023-12-28 LAB — SPECIMEN STATUS

## 2023-12-28 NOTE — Progress Notes (Unsigned)
 [facility] [address] [place] [address] Dept: [phone] Dept Fax: [phone]  Psychotherapy Progress Note  Patient ID: Deborah Little, female  DOB: March 28, 1986, 38 y.o.  MRN: [id number]  12/28/2023 Start time: *** End time: ***  Method of Visit: {Method of Visit:27865}  Present: {family members:20773}  Current Concerns: ***  Current Symptoms: {Current Symptoms:[phone]}  Psychiatric Specialty Exam: General Appearance: {Appearance:22683}  Eye Contact:  {BHH EYE CONTACT:22684}  Speech:  {Speech:22685}  Volume:  {Volume (PAA):22686}  Mood:  {BHH MOOD:22306}  Affect:  {Affect (PAA):22687}  Thought Process:  {Thought Process (PAA):22688}  Orientation:  {BHH ORIENTATION (PAA):22689}  Thought Content:  {Thought Content:22690}  Suicidal Thoughts:  {ST/HT (PAA):22692}  Homicidal Thoughts:  {ST/HT (PAA):22692}  Memory:  {BHH MEMORY:22881}  Judgement:  {Judgement (PAA):22694}  Insight:  {Insight (PAA):22695}  Psychomotor Activity:  {Psychomotor (PAA):22696}  Concentration:  {Concentration:21399}  Recall:  {BHH GOOD/FAIR/POOR:22877}  Fund of Knowledge:{BHH GOOD/FAIR/POOR:22877}  Language: {BHH GOOD/FAIR/POOR:22877}  Akathisia:  {BHH YES OR NO:22294}  Handed:  {Handed:22697}  AIMS (if indicated):  {Desc; done/not:10129}  Assets:  {Assets (PAA):22698}  ADL's:  {BHH JIO'D:77709}  Cognition: {chl bhh cognition:304700322}  Sleep:  {BHH GOOD/FAIR/POOR:22877}   Diagnosis: ***  Anticipated Frequency of Visits: *** Anticipated Length of Treatment Episode: ***  Short Term Goals/Goals for Treatment Session: *** [ ]  PHQ-9, GAD-7  Progress Towards Goals: {Progress Towards Goals:21014066}  Treatment Intervention: {Treatment [phone]}  Medical Necessity: {Medical Necessity:210140004}  Assessment Tools:    12/19/2023   11:41 AM 09/27/2023    9:41 AM 07/31/2023   10:47 AM   Depression screen PHQ 2/9  Decreased Interest 0 1 3  Down, Depressed, Hopeless 0 1 3  PHQ - 2 Score 0 2 6  Altered sleeping 1 3 3   Tired, decreased energy 0 3 3  Change in appetite 0 3 3  Feeling bad or failure about yourself  0 1 3  Trouble concentrating 2 3 3   Moving slowly or fidgety/restless 0 0 2  Suicidal thoughts 0 0 0  PHQ-9 Score 3 15 23   Difficult doing work/chores   Very difficult   Failed to redirect to the Timeline version of the REVFS SmartLink. Flowsheet Row ED from 08/02/2023 in [facility] ED from 03/28/2023 in [facility] Emergency Department at [facility] ED from 03/22/2023 in [facility] Emergency Department at [facility]  C-SSRS RISK CATEGORY No Risk No Risk No Risk    Collaboration of Care: [facility] OP Collaboration of Care:[id number]}  Patient/Guardian was advised Release of Information must be obtained prior to any record release in order to collaborate their care with an outside provider. Patient/Guardian was advised if they have not already done so to contact the registration department to sign all necessary forms in order for us  to release information regarding their care.   Consent: Patient/Guardian gives verbal consent for treatment and assignment of benefits for services provided during this visit. Patient/Guardian expressed understanding and agreed to proceed.   Plan: ***  Caley Volkert, MD 12/28/2023

## 2023-12-31 ENCOUNTER — Ambulatory Visit (HOSPITAL_COMMUNITY): Payer: Self-pay | Admitting: Psychiatry

## 2023-12-31 ENCOUNTER — Telehealth (HOSPITAL_COMMUNITY): Payer: Self-pay | Admitting: Psychiatry

## 2023-12-31 ENCOUNTER — Encounter (HOSPITAL_COMMUNITY): Payer: Self-pay

## 2023-12-31 ENCOUNTER — Encounter (HOSPITAL_COMMUNITY): Admitting: Psychiatry

## 2023-12-31 LAB — B12 AND FOLATE PANEL
Folate: 7.4 ng/mL (ref >3.0)
Vitamin B-12: 325 pg/mL (ref 232–1245)

## 2023-12-31 LAB — TSH: TSH: 1.38 u[IU]/mL (ref 0.450–4.500)

## 2023-12-31 LAB — VITAMIN D 25 HYDROXY (VIT D DEFICIENCY, FRACTURES): Vit D, 25-Hydroxy: 44.1 ng/mL (ref 30.0–100.0)

## 2023-12-31 LAB — SPECIMEN STATUS REPORT

## 2023-12-31 NOTE — Progress Notes (Signed)
 This encounter was created in error - please disregard.

## 2023-12-31 NOTE — Telephone Encounter (Signed)
 I called patient regarding this provider's availability for therapy and 8am appointment slot. She reported that it would be difficult for her to continue to make 8am therapy appointments due to work and would be easier for her to follow-up with a therapist who has more availability in the community. I discussed with her that we can continue medication management and will provide her with a list of therapists in the community to follow-up with. This was discussed with the front desk who will cancel the rescheduled appointment as well as email patient a list of community therapists. Patient expressed appreciation for the call.

## 2024-01-14 ENCOUNTER — Ambulatory Visit (HOSPITAL_COMMUNITY): Admitting: Psychiatry

## 2024-01-28 ENCOUNTER — Encounter: Payer: Self-pay | Admitting: Obstetrics & Gynecology

## 2024-01-31 NOTE — Progress Notes (Deleted)
 BH MD Outpatient Progress Note  01/31/2024 4:51 PM TANEA MOGA  MRN:  [id number]  Assessment:  Deborah Little presents for follow-up evaluation. Today, 01/31/24, patient reports improvement in her mood since starting cymbalta. She reports feeling more energy, less pain, and feeling less depressed and unmotivated. We discussed increasing her cymbalta for a regular starting dose of cymbalta. We also obtained repeat labs to assess for any nutritional deficiencies that could have contributed to a change in her mood. In addition, we discussed her ongoing alcohol use. Discussed replacing alcohol with trazodone to try to help with her sleep, her previous sleep study was unremarkable for OSA. She has been abstaining from cannabis for 2 months and provided supportive encouragement regarding cannabis cessation and continued to encourage abstinence from cannabis. With regards to her elevated blood pressure, she reports that she is monitoring this at home and her PCP is following.   Identifying Information: Deborah Little is a 38 y.o. female with a history of depression, prediabetes, osteoarthritis, fibroids, iron deficiency anemia, and Vitamin D deficiency who is an established patient with [facility] for management of depression.   Plan:  # MDD, in partial remission  Past medication trials: Lamictal (tearfulness), Depakote (more sad), Abilify (jittery, fatigued), Vraylar (can't remember - may not have taken), Xanax, Lyrica, Atarax  Status of problem: ongoing Interventions: -- Increase cymbalta 30mg  daily -- Start trazodone 50mg  at bedtime PRN for insomnia  -- Continue Atarax 25 mg BID PRN anxiety/sleep  -- Start therapy with this provider July 14th 8am.  -- R/o contributing medical conditions:             -- CBC and iron panel 10/29/23 wnl             -- repeat vitamin D, B12, folate              -- repeat TSH   # Alcohol use  #Cannabis use Past medication trials: none   Status of problem: ongoing Interventions: -- continue to encourage cessation  Patient was given contact information for behavioral health clinic and was instructed to call 911 for emergencies.   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  -Vit D, B12, and TSH wnl.   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 Feels like her mood can change in response to triggers but denies manic / hypomanic symptoms. Reports that is also helps with her pain. She feels like a different person on the medication. Reports she sleeps 5-6 hours each night. No issues with appetite. No GI distress or other medication side effects. Reports she wasn't as comfortable with group therapy in the intensive outpatient program and had issues with waking up for IOP. She is interested in individual therapy and offered patient options with this provider on CBT or with another provider and she was open to receiving CBT with this provider. Reports she has not been taking the cannabis due to not feeling as down. Reports she is drinking every other night 2-3 shots or 2-3 glasses of wine to feel calm and to help with sleep. Has tried sleep gummies, melatonin for sleep before. Reports she doesn't like how the atarax makes her feel (very groggy in the AM) but it does help with sleep. Feel like the cannabis makes her overthink. We discussed increasing her cymbalta to usual starting dose of 30mg  and trying trazodone to help  with her sleep instead of alcohol. At times she does have limited insight, stating that she doesn't want to be on medication long-term but also concurrently asking about xanax and using alcohol. This provider provided psychoeducation on the risks of long-term benzodiazepine prescription. She was in agreement with this plan.   PHQ-9: 3 (issues with sleep) GAD-7: 6 (1 for all items except item 5)  Visit Diagnosis:  No diagnosis  found.   Past Psychiatric History:  Diagnoses: MDD vs. Bipolar disorder, cannabis use, PTSD, anxiety  Medication trials:   Current medication: cymbalta  20mg    Past medications: tramadol , modafinil , lyrica , buspar, celexa , atarax. Lamictal (tearfulness), Depakote (more sad), Abilify (jittery, fatigued), Vraylar (can't remember - may not have taken), Xanax, Lyrica , Atarax  Previous psychiatrist/therapist: Dr. Mercy initial, seen at [facility] ago; currently seeing therapist through Ob[facility]  Hospitalizations: denies, went to [facility] ED 07/2023 for increased depressive symptoms.  Suicide attempts: denies SIB: denies Hx of violence towards others: denies Current access to guns:  yes - counseled on proper storage and safety; consideration of removal if depressive symptoms worsen (denies active SI or HI)  Hx of trauma/abuse: denies Substance use:   --Edibles: 300 mg a few times per month; denies other forms of CBD/THC             -- Denies use of stimulants, opioids, BZDs, hallucinogens             -- Denies use of tobacco             -- Etoh: every other day - 3-4 drinks at a time                         -- Denies history of withdrawal  Past Medical History:  Past Medical History:  Diagnosis Date   Anemia    Anxiety    Bilateral swelling of feet and ankles    Biliary dyskinesia 02/2018   Chest pain    Depression    Fatty liver    Fish allergy     Gallbladder problem    Hyperlipidemia    Joint pain    Obesity    Osteoarthritis    Other fatigue    Prediabetes    Shortness of breath    Shortness of breath on exertion    Sleep apnea    Swallowing difficulty    Vitamin D  deficiency   -- Home sleep study March 2023: only isolated apneic events; recommended to avoid sleeping in supine position; weight loss; sleep hygiene    Past Surgical History:  Procedure Laterality Date   CHOLECYSTECTOMY N/A 03/13/2018   Procedure: LAPAROSCOPIC CHOLECYSTECTOMY;  Surgeon: Vernetta Berg,  MD;  Location: [facility];  Service: General;  Laterality: N/A;   GALLBLADDER SURGERY  2018   IR EMBO TUMOR ORGAN ISCHEMIA INFARCT INC GUIDE ROADMAPPING  01/16/2023   IR FLUORO GUIDED NEEDLE PLC ASPIRATION/INJECTION LOC  01/16/2023   IR RADIOLOGIST EVAL & MGMT  12/05/2022   IR RADIOLOGIST EVAL & MGMT  02/01/2023   WISDOM TOOTH EXTRACTION      Family Psychiatric History:  Dad: functional alcoholic Mom: anxiety Sister's son: bipolar disorder  Family History:  Family History  Problem Relation Age of Onset   Obesity Mother    Drug abuse Mother    Sleep apnea Mother    Anxiety disorder Mother    Depression Mother    Hypertension Mother    Breast cancer Mother 69  ATM+   Obesity Father    Drug abuse Father    Alcoholism Father    Hypertension Father    Swallowing difficulties Brother    Lung cancer Maternal Aunt 50   Colon cancer Paternal Aunt 42 - 1   Stroke Maternal Grandmother    Pancreatic cancer Maternal Great-grandmother 30 - 33   Depression Other    Anxiety disorder Other    Sleep apnea Other    Alcoholism Other    Drug abuse Other    Obesity Other    Bipolar disorder Nephew     Social History:  Academic/Vocational: works part time at [employer]. 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   --Considers self a religious person --Has a 21 year old son  Social History   Socioeconomic History   Marital status: Single    Spouse name: Not on file   Number of children: 1   Years of education: Not on file   Highest education level: Some college, no degree  Occupational History   Occupation: SOCIAL SERVICES    Employer: [employer]  Tobacco Use   Smoking status: Never   Smokeless tobacco: Never  Vaping Use   Vaping status: Never Used  Substance and Sexual Activity   Alcohol use: Yes    Comment: 3-4 drinks 3-4 times weekly   Drug use: Yes    Types: Marijuana    Comment: Edibles a few times monthly   Sexual activity: Yes    Partners: Male     Comment: Yaz  Other Topics Concern   Not on file  Social History Narrative   Lives at home with child   Right handed   Caffeine: 1 cup of coffee a day   Social Drivers of Corporate investment banker Strain: Low Risk  (07/31/2023)   Received from [facility]   Overall Financial Resource Strain (CARDIA)    Difficulty of Paying Living Expenses: Not hard at all  Food Insecurity: No Food Insecurity (07/31/2023)   Received from [facility]   Hunger Vital Sign    Within the past 12 months, you worried that your food would run out before you got the money to buy more.: Never true    Within the past 12 months, the food you bought just didn't last and you didn't have money to get more.: Never true  Transportation Needs: No Transportation Needs (07/31/2023)   Received [facility] - Transportation    Lack of Transportation (Medical): No    Lack of Transportation (Non-Medical): No  Physical Activity: Unknown (04/11/2023)   Received from [facility]   Exercise Vital Sign    On average, how many days per week do you engage in moderate to strenuous exercise (like a brisk walk)?: 0 days    Minutes of Exercise per Session: Not on file  Stress: No Stress Concern Present (04/01/2021)   Received from [facility] - Occupational Stress Questionnaire    Feeling of Stress : Not at all  Social Connections: Socially Integrated (04/11/2023)   Received from [facility]   Social Network    How would you rate your social network (family, work, friends)?: Good participation with social networks    Allergies:  Allergies  Allergen Reactions   Fish Allergy Shortness Of Breath   Hydrocodone Nausea And Vomiting   Penicillins Hives    Current Medications: Current Outpatient Medications  Medication Sig Dispense Refill   cholecalciferol (VITAMIN  D3) 25 MCG (1000 UNIT) tablet Take 1,000 Units by mouth daily.     DULoxetine  (CYMBALTA ) 30 MG  capsule Take 1 capsule (30 mg total) by mouth daily. 30 capsule 1   hydrOXYzine (ATARAX) 25 MG tablet Take 25 mg by mouth 2 (two) times daily as needed for anxiety (sleep).     traZODone  (DESYREL ) 50 MG tablet Take 1 tablet (50 mg total) by mouth at bedtime. 30 tablet 1   valACYclovir  (VALTREX ) 500 MG tablet TAKE 1 TABLET BY MOUTH DAILY 90 tablet 0   No current facility-administered medications for this visit.   Facility-Administered Medications Ordered in Other Visits  Medication Dose Route Frequency Provider Last Rate Last Admin   acetaminophen  (TYLENOL ) tablet 650 mg  650 mg Oral Once Cleotilde Ronal RAMAN, MD       diphenhydrAMINE  (BENADRYL ) capsule 25 mg  25 mg Oral Once Cleotilde Ronal RAMAN, MD       iron  sucrose (VENOFER ) injection 200 mg  200 mg Intravenous Once Cleotilde Ronal RAMAN, MD       sodium chloride  0.9 % bolus 250 mL  250 mL Intravenous Once Cleotilde Ronal RAMAN, MD        ROS: Review of Systems  Constitutional:  Negative for appetite change and unexpected weight change.  Respiratory:  Negative for shortness of breath.   Cardiovascular:  Negative for chest pain.    Objective:  Psychiatric Specialty Exam: There were no vitals taken for this visit.There is no height or weight on file to calculate BMI.  General Appearance: Fairly Groomed with leopard glasses  Eye Contact:  Fair  Speech:  Clear and Coherent and Normal Rate  Volume:  Normal  Mood:  Euthymic  Affect:  Appropriate, bright  Thought Content: WDL   Suicidal Thoughts:  No  Homicidal Thoughts:  No  Thought Process:  Coherent  Orientation:  Full (Time, Place, and Person)    Memory: Grossly intact   Judgment:  Intact  Insight:  Fair  Concentration:  Concentration: Fair  Recall: not formally assessed   Fund of Knowledge: Fair  Language: Good  Psychomotor Activity:  Normal  Akathisia:  No  AIMS (if indicated): not done  Assets:  Communication Skills Desire for Improvement Housing Resilience Social  Support Vocational/Educational  ADL's:  Intact  Cognition: WNL  Sleep:  Poor   PE: General: well-appearing; obese; no acute distress  Pulm: no increased work of breathing on room air  Strength & Muscle Tone: within normal limits Neuro: no focal neurological deficits observed  Gait & Station: normal  Metabolic Disorder Labs: Lab Results  Component Value Date   HGBA1C 5.7 (H) 07/03/2023   MPG 114 02/01/2016   MPG 123 (H) 07/09/2014   No results found for: PROLACTIN Lab Results  Component Value Date   CHOL 159 02/01/2016   TRIG 80 02/01/2016   HDL 35 (L) 02/01/2016   CHOLHDL 4.5 02/01/2016   VLDL 16 02/01/2016   LDLCALC 108 02/01/2016   LDLCALC 108 (H) 07/09/2014   Lab Results  Component Value Date   TSH 1.380 12/26/2023   TSH 0.93 02/26/2018    Therapeutic Level Labs: No results found for: LITHIUM No results found for: VALPROATE No results found for: CBMZ  Screenings:  GAD-7    Flowsheet Row Office Visit from 12/19/2023 in [facility] for [employer] at [facility] from 09/27/2023 in [facility] for [facility] at [facility] Office Visit from 06/21/2022 in [facility] at [facility] Office Visit from 04/27/2022 in [place] [facility] [employer] at [employer] [facility] Office Visit from 04/20/2021 in Center for [employer] at [facility]  Total GAD-7 Score 3 14 0 0 7   PHQ2-9    Flowsheet Row Office Visit from 12/19/2023 in [facility] for [employer] at [facility] from 09/27/2023 in [facility] at [facility] Clinical Support from 07/31/2023 in [facility] at [employer] Office Visit from 07/03/2023 in [facility] for [facility] at [place] Office Visit from 03/20/2023 in [facility] for  [facility] at [facility]  PHQ-2 Total Score 0 2 6 0 0  PHQ-9 Total Score 3 15 23  -- --   Flowsheet Row ED from 08/02/2023 in [facility] from 03/28/2023 in [facility] Emergency Department at [facility] ED from 03/22/2023 in [facility] Emergency Department at [facility]  C-SSRS RISK CATEGORY No Risk No Risk No Risk    Collaboration of Care: Collaboration of Care: Dr. Carvin  Patient/Guardian was advised Release of Information must be obtained prior to any record release in order to collaborate their care with an outside provider. Patient/Guardian was advised if they have not already done so to contact the registration department to sign all necessary forms in order for us  to release information regarding their care.   Consent: Patient/Guardian gives verbal consent for treatment and assignment of benefits for services provided during this visit. Patient/Guardian expressed understanding and agreed to proceed.   Corean Minor, MD, PGY-3 01/31/2024, 4:51 PM

## 2024-02-04 ENCOUNTER — Ambulatory Visit (HOSPITAL_COMMUNITY): Admitting: Psychiatry

## 2024-02-04 ENCOUNTER — Telehealth (HOSPITAL_COMMUNITY): Payer: Self-pay | Admitting: Psychiatry

## 2024-02-04 ENCOUNTER — Other Ambulatory Visit (HOSPITAL_COMMUNITY): Payer: Self-pay | Admitting: Psychiatry

## 2024-02-04 ENCOUNTER — Telehealth (HOSPITAL_COMMUNITY): Payer: Self-pay | Admitting: *Deleted

## 2024-02-04 DIAGNOSIS — F3341 Major depressive disorder, recurrent, in partial remission: Secondary | ICD-10-CM

## 2024-02-04 NOTE — Telephone Encounter (Signed)
 Pt LVM on nurse line requesting refill of the Cymbalta  30 mg. Pt appointment rescheduled for 02/27/24 and pt will be out of this medication by that time she says. Medication last e-scribed on 12/26/23 with 1 fill, so pt shouldn't be short. Pt did mention on VM that she thought she was taking 60 mg? Please review.

## 2024-02-04 NOTE — Telephone Encounter (Signed)
 Called patient at [phone] at 9:50am regarding appointment at 9:45am. No answer, left voicemail for patient with front desk # to reschedule appointment.

## 2024-02-04 NOTE — Telephone Encounter (Signed)
 Yes just wanted to let you know as she may be taking 60 mg every day. Thanks!

## 2024-02-11 ENCOUNTER — Ambulatory Visit (HOSPITAL_COMMUNITY): Admitting: Psychiatry

## 2024-02-22 NOTE — Progress Notes (Signed)
 BH MD Outpatient Progress Note  02/27/2024 8:38 AM Deborah Little  MRN:  [id number]  Televisit via video: I connected with patient on 02/27/24 at  8:00 AM EDT by a video enabled telemedicine application and verified that I am speaking with the correct person using two identifiers.  Location: Patient: home in [place] Provider: Office within [place]   I discussed the limitations of evaluation and management by telemedicine and the availability of in person appointments. The patient expressed understanding and agreed to proceed.  I discussed the assessment and treatment plan with the patient. The patient was provided an opportunity to ask questions and all were answered. The patient agreed with the plan and demonstrated an understanding of the instructions.   The patient was advised to call back or seek an in-person evaluation if the symptoms worsen or if the condition fails to improve as anticipated.  Assessment:  Deborah Little presents for follow-up evaluation. Today, 02/27/24, patient reports continued improvement in her mood.  She continues to have elevated blood pressure and recently reached out to a physician regarding her elevated blood pressure and obtained labs.  Discussed with patient to continue to follow-up with her primary care provider and to seek immediate medical attention if she has an elevated blood pressure again with a headache.  She does not endorse any focal neurological deficits on interview today and reports that she is feeling well.  She reports that she will buy a blood pressure cuff to continue to monitor her blood pressure at home and she will follow-up with her primary care provider.  She otherwise feels that she is doing well on the Cymbalta  and shared decision making for no medication changes at this time.  We discussed that her nutritional labs are within normal limits.  Encouraged her to seek a therapist if she feels that would be helpful.  Identifying  Information: Deborah Little is a 38 y.o. female with a history of depression, prediabetes, osteoarthritis, fibroids, iron  deficiency anemia, and Vitamin D  deficiency who is an established patient with [facility] for management of depression.   Plan:  # MDD, in partial remission  Past medication trials: Lamictal (tearfulness), Depakote (more sad), Abilify (jittery, fatigued), Vraylar (can't remember - may not have taken), Xanax, Lyrica , Atarax  Status of problem: ongoing Interventions: -- Continue cymbalta  30mg  daily -- Stop trazodone  50mg  at bedtime PRN for insomnia  -- Continue Atarax 25 mg BID PRN anxiety/sleep  -- encourage engagement with therapy -- Labs reviewed             -- CBC and iron  panel 10/29/23 wnl       -Vit D, B12, and TSH wnl.   # Alcohol use  #Cannabis use disorder Past medication trials: none  Status of problem: ongoing Interventions: -- Provided positive encouragement regarding continued cessation  Patient was given contact information for behavioral health clinic and was instructed to call 911 for emergencies.   Future Appointments  Date Time Provider Department Center  04/30/2024  2:15 PM Cleotilde Ronal GORMAN, MD [facility] [id number]  05/19/2024  3:00 PM Graham Krabbe, MD [facility] None    Subjective:  Chief Complaint: I have been doing good  Interval History:  -- PDMP reviewed, last percocet rx in 2024.  -Vit D, B12, and TSH wnl.  -cancelled appointments   Reports this Sunday her blood pressure has been high and she had a headache, she used doctor on demand and she went and got labs. Reports she  had an elevated blood pressure of 179/97. Then she had an elevated blood pressure of 189/91.  She denies any current headache or focal neurological deficit.  She reports that her blood pressure prior to Sunday was stable. Reports her mood has been mostly okay. She reports she is sleeping 6-7 hours a night. Reports her appetite has been good.  Reports trazodone  did not help with sleep. Reports hydroxyzine makes her feel too sedated. Reports she is taking cymbalta  30mg  daily and reports that she had elevated blood pressure prior to taking the Cymbalta .  Reports her blood pressure has mainly been all right and has just been elevated in the last 2 days.  Discussed with patient to continue to monitor her blood pressure and to seek additional medical attention if she develops a headache again. Reports she is feeling better. She reports she is still having some soreness in her knees today and one day last week. Denies substance use for a week after our last visit. Reports that she has been busy with work. Reports that she has been going to the gym every night. Reports she is going to start a water aerobics class. Work has been busy and stressful, has also been catering. She denies SI/HI/AVH.   Visit Diagnosis:    ICD-10-CM   1. Recurrent major depressive disorder, in partial remission (HCC)  F33.41     2. Cannabis use disorder, mild, in early remission  F12.11       Past Psychiatric History:  Diagnoses: MDD vs. Bipolar disorder, cannabis use, PTSD, anxiety  Medication trials:   Current medication: cymbalta  20mg    Past medications: tramadol , modafinil , lyrica , buspar, celexa , atarax. Lamictal (tearfulness), Depakote (more sad), Abilify (jittery, fatigued), Vraylar (can't remember - may not have taken), Xanax, Lyrica , Atarax  Previous psychiatrist/therapist: Dr. Mercy initial, seen at [facility] ago; currently seeing therapist through Ob/gyn  Hospitalizations: denies, went to [facility] ED 07/2023 for increased depressive symptoms.  Suicide attempts: denies SIB: denies Hx of violence towards others: denies Current access to guns:  yes - counseled on proper storage and safety; consideration of removal if depressive symptoms worsen (denies active SI or HI)  Hx of trauma/abuse: denies Substance use:   --Edibles: 300 mg a few times per month;  denies other forms of CBD/THC             -- Denies use of stimulants, opioids, BZDs, hallucinogens             -- Denies use of tobacco             -- Etoh: every other day - 3-4 drinks at a time                         -- Denies history of withdrawal  Past Medical History:  Past Medical History:  Diagnosis Date   Anemia    Anxiety    Bilateral swelling of feet and ankles    Biliary dyskinesia 02/2018   Chest pain    Depression    Fatty liver    Fish allergy     Gallbladder problem    Hyperlipidemia    Joint pain    Obesity    Osteoarthritis    Other fatigue    Prediabetes    Shortness of breath    Shortness of breath on exertion    Sleep apnea    Swallowing difficulty    Vitamin D  deficiency   -- Home sleep study March  2023: only isolated apneic events; recommended to avoid sleeping in supine position; weight loss; sleep hygiene    Past Surgical History:  Procedure Laterality Date   CHOLECYSTECTOMY N/A 03/13/2018   Procedure: LAPAROSCOPIC CHOLECYSTECTOMY;  Surgeon: Vernetta Berg, MD;  Location: [facility];  Service: General;  Laterality: N/A;   GALLBLADDER SURGERY  2018   IR EMBO TUMOR ORGAN ISCHEMIA INFARCT INC GUIDE ROADMAPPING  01/16/2023   IR FLUORO GUIDED NEEDLE PLC ASPIRATION/INJECTION LOC  01/16/2023   IR RADIOLOGIST EVAL & MGMT  12/05/2022   IR RADIOLOGIST EVAL & MGMT  02/01/2023   WISDOM TOOTH EXTRACTION      Family Psychiatric History:  Dad: functional alcoholic Mom: anxiety Sister's son: bipolar disorder  Family History:  Family History  Problem Relation Age of Onset   Obesity Mother    Drug abuse Mother    Sleep apnea Mother    Anxiety disorder Mother    Depression Mother    Hypertension Mother    Breast cancer Mother 22       ATM+   Obesity Father    Drug abuse Father    Alcoholism Father    Hypertension Father    Swallowing difficulties Brother    Lung cancer Maternal Aunt 38   Colon cancer Paternal Aunt 33 - 60    Stroke Maternal Grandmother    Pancreatic cancer Maternal Great-grandmother 22 - 27   Depression Other    Anxiety disorder Other    Sleep apnea Other    Alcoholism Other    Drug abuse Other    Obesity Other    Bipolar disorder Nephew     Social History:  Academic/Vocational: works part time at [employer]. 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   --Considers self a religious person --Has a 39 year old son [place]  Social History   Socioeconomic History   Marital status: Single    Spouse name: Not on file   Number of children: 1   Years of education: Not on file   Highest education level: Some college, no degree  Occupational History   Occupation: SOCIAL SERVICES    Employer: [employer]  Tobacco Use   Smoking status: Never   Smokeless tobacco: Never  Vaping Use   Vaping status: Never Used  Substance and Sexual Activity   Alcohol use: Yes    Comment: 3-4 drinks 3-4 times weekly   Drug use: Yes    Types: Marijuana    Comment: Edibles a few times monthly   Sexual activity: Yes    Partners: Male    Comment: Yaz  Other Topics Concern   Not on file  Social History Narrative   Lives at home with child   Right handed   Caffeine: 1 cup of coffee a day   Social Drivers of Corporate investment banker Strain: Low Risk  (07/31/2023)   Received from [facility]   Overall Financial Resource Strain (CARDIA)    Difficulty of Paying Living Expenses: Not hard at all  Food Insecurity: No Food Insecurity (07/31/2023)   [facility]   Hunger Vital Sign    Within the past 12 months, you worried that your food would run out before you got the money to buy more.: Never true    Within the past 12 months, the food you bought just didn't last and you didn't have money to get more.: Never true  Transportation Needs: No Transportation Needs (07/31/2023)   Received from [facility]  PRAPARE - Administrator, Civil Service (Medical): No    Lack of Transportation  (Non-Medical): No  Physical Activity: Unknown (04/11/2023)   [facility] from [facility]   Exercise Vital Sign    On average, how many days per week do you engage in moderate to strenuous exercise (like a brisk walk)?: 0 days    Minutes of Exercise per Session: Not on file  Stress: No Stress Concern Present (04/01/2021)   Received from [facility] - Occupational Stress Questionnaire    Feeling of Stress : Not at all  Social Connections: Socially Integrated (04/11/2023)   [facility]   Social Network    How would you rate your social network (family, work, friends)?: Good participation with social networks    Allergies:  Allergies  Allergen Reactions   Fish Allergy  Shortness Of Breath   Hydrocodone  Nausea And Vomiting   Penicillins Hives    Current Medications: Current Outpatient Medications  Medication Sig Dispense Refill   cholecalciferol (VITAMIN D3) 25 MCG (1000 UNIT) tablet Take 1,000 Units by mouth daily.     DULoxetine  (CYMBALTA ) 30 MG capsule Take 1 capsule (30 mg total) by mouth daily. 30 capsule 1   hydrOXYzine (ATARAX) 25 MG tablet Take 25 mg by mouth 2 (two) times daily as needed for anxiety (sleep).     traZODone  (DESYREL ) 50 MG tablet Take 1 tablet (50 mg total) by mouth at bedtime. 30 tablet 1   valACYclovir  (VALTREX ) 500 MG tablet TAKE 1 TABLET BY MOUTH DAILY 90 tablet 0   No current facility-administered medications for this visit.   Facility-Administered Medications Ordered in Other Visits  Medication Dose Route Frequency Provider Last Rate Last Admin   acetaminophen  (TYLENOL ) tablet 650 mg  650 mg Oral Once Cleotilde Ronal RAMAN, MD       diphenhydrAMINE  (BENADRYL ) capsule 25 mg  25 mg Oral Once Cleotilde Ronal RAMAN, MD       iron  sucrose (VENOFER ) injection 200 mg  200 mg Intravenous Once Cleotilde Ronal RAMAN, MD       sodium chloride  0.9 % bolus 250 mL  250 mL Intravenous Once Cleotilde Ronal RAMAN, MD         ROS: Review of Systems  Constitutional:  Negative for appetite change and unexpected weight change.  Respiratory:  Negative for shortness of breath.   Cardiovascular:  Negative for chest pain.  Musculoskeletal:  Positive for arthralgias.    Objective:  Psychiatric Specialty Exam: There were no vitals taken for this visit.There is no height or weight on file to calculate BMI.  General Appearance: Fairly Groomed   Eye Contact:  Fair  Speech:  Clear and Coherent and Normal Rate  Volume:  Normal  Mood:  Euthymic  Affect:  Appropriate, bright  Thought Content: WDL   Suicidal Thoughts:  No  Homicidal Thoughts:  No  Thought Process:  Coherent  Orientation:  Full (Time, Place, and Person)    Memory: Grossly intact   Judgment:  Intact  Insight:  Fair  Concentration:  Concentration: Fair  Recall: not formally assessed   Fund of Knowledge: Fair  Language: Good  Psychomotor Activity:  Normal  Akathisia:  No  AIMS (if indicated): not done  Assets:  Communication Skills Desire for Improvement Housing Resilience Social Support Vocational/Educational  ADL's:  Intact  Cognition: WNL  Sleep:  Poor   PE: General: well-appearing; obese; no acute distress  Pulm: no increased work of  breathing on room air  Strength & Muscle Tone: within normal limits Neuro: no focal neurological deficits observed  Gait & Station: normal  Metabolic Disorder Labs: Lab Results  Component Value Date   HGBA1C 5.7 (H) 07/03/2023   MPG 114 02/01/2016   MPG 123 (H) 07/09/2014   No results found for: PROLACTIN Lab Results  Component Value Date   CHOL 159 02/01/2016   TRIG 80 02/01/2016   HDL 35 (L) 02/01/2016   CHOLHDL 4.5 02/01/2016   VLDL 16 02/01/2016   LDLCALC 108 02/01/2016   LDLCALC 108 (H) 07/09/2014   Lab Results  Component Value Date   TSH 1.380 12/26/2023   TSH 0.93 02/26/2018    Therapeutic Level Labs: No results found for: LITHIUM No results found for:  VALPROATE No results found for: CBMZ  Screenings:  GAD-7    Flowsheet Row Office Visit from 12/19/2023 in [facility] for [employer] [facility] from 09/27/2023 in Center for [facility] at [facility] Office Visit from 06/21/2022 in Center for [facility] at [facility] Office Visit from 04/27/2022 in Center for [employer] at [facility] Office Visit from 04/20/2021 in Center for [facility] at [facility] for Women  Total GAD-7 Score 3 14 0 0 7   PHQ2-9    Flowsheet Row Office Visit from 12/19/2023 in [facility] from 09/27/2023 in Center for [facility] at [facility] Clinical Support from 07/31/2023 in [facility] at [facility] Office Visit from 07/03/2023 in [facility] for [facility] at [place] Office Visit from 03/20/2023 in [facility] for [facility] at [facility]  PHQ-2 Total Score 0 2 6 0 0  PHQ-9 Total Score 3 15 23  -- --   Flowsheet Row ED from 08/02/2023 in [facility] ED from 03/28/2023 in [facility] at [facility] ED from 03/22/2023 in [facility] Department at [facility]  C-SSRS RISK CATEGORY No Risk No Risk No Risk    Collaboration of Care: Collaboration of Care: Attending psychiatrist  Patient/Guardian was advised Release of Information must be obtained prior to any record release in order to collaborate their care with an outside provider. Patient/Guardian was advised if they have not already done so to contact the registration department to sign all necessary forms in order for us  to release information regarding their care.   Consent: Patient/Guardian gives verbal consent for treatment  and assignment of benefits for services provided during this visit. Patient/Guardian expressed understanding and agreed to proceed.   Corean Minor, MD, PGY-3 02/27/2024, 8:38 AM

## 2024-02-27 ENCOUNTER — Telehealth (HOSPITAL_BASED_OUTPATIENT_CLINIC_OR_DEPARTMENT_OTHER): Admitting: Psychiatry

## 2024-02-27 DIAGNOSIS — F3341 Major depressive disorder, recurrent, in partial remission: Secondary | ICD-10-CM | POA: Diagnosis not present

## 2024-02-27 DIAGNOSIS — F1211 Cannabis abuse, in remission: Secondary | ICD-10-CM | POA: Diagnosis not present

## 2024-02-27 MED ORDER — DULOXETINE HCL 30 MG PO CPEP: 30.0000 mg | ORAL_CAPSULE | Freq: Every day | ORAL | 2 refills | Status: AC

## 2024-02-27 NOTE — Addendum Note (Signed)
 Addended by: CARVIN CROCK on: 02/27/2024 02:45 PM   Modules accepted: Level of Service

## 2024-03-28 ENCOUNTER — Other Ambulatory Visit (HOSPITAL_BASED_OUTPATIENT_CLINIC_OR_DEPARTMENT_OTHER): Payer: Self-pay | Admitting: Obstetrics & Gynecology

## 2024-04-30 ENCOUNTER — Ambulatory Visit (HOSPITAL_BASED_OUTPATIENT_CLINIC_OR_DEPARTMENT_OTHER): Admitting: Obstetrics & Gynecology

## 2024-04-30 ENCOUNTER — Encounter (HOSPITAL_BASED_OUTPATIENT_CLINIC_OR_DEPARTMENT_OTHER): Payer: Self-pay | Admitting: Obstetrics & Gynecology

## 2024-04-30 VITALS — BP 126/83 | HR 114 | Ht 67.0 in | Wt 315.0 lb

## 2024-04-30 DIAGNOSIS — B009 Herpesviral infection, unspecified: Secondary | ICD-10-CM

## 2024-04-30 DIAGNOSIS — Z01419 Encounter for gynecological examination (general) (routine) without abnormal findings: Secondary | ICD-10-CM

## 2024-04-30 DIAGNOSIS — Z124 Encounter for screening for malignant neoplasm of cervix: Secondary | ICD-10-CM

## 2024-04-30 DIAGNOSIS — Z1331 Encounter for screening for depression: Secondary | ICD-10-CM

## 2024-04-30 DIAGNOSIS — Z803 Family history of malignant neoplasm of breast: Secondary | ICD-10-CM

## 2024-04-30 DIAGNOSIS — Z1151 Encounter for screening for human papillomavirus (HPV): Secondary | ICD-10-CM

## 2024-04-30 MED ORDER — VALACYCLOVIR HCL 500 MG PO TABS: 500.0000 mg | ORAL_TABLET | Freq: Every day | ORAL | 3 refills | Status: AC

## 2024-04-30 NOTE — Progress Notes (Signed)
 ANNUAL EXAM Patient name: Deborah Little MRN [id number]  Date of birth: 17-Jan-1986 Chief Complaint:   Gynecologic Exam  History of Present Illness:   Deborah Little is a 38 y.o. G52P1001 African-American female being seen today for a routine annual exam.   Doing well.  Cycles are regular and last 3-4 days and flow is normal.  No clotting.    No LMP recorded. (Menstrual status: Other).  Last pap 04/20/2021. Results were: NILM w/ HRHPV not done. H/O abnormal pap: no Last mammogram: Family h/o breast cancer: yes mother Last colonoscopy: Family h/o colorectal cancer: yes paternal aunt.     04/30/2024    2:27 PM 04/30/2024    2:23 PM 12/19/2023   11:41 AM 09/27/2023    9:41 AM 07/31/2023   10:47 AM  Depression screen PHQ 2/9  Decreased Interest 0 0 0 1 3  Down, Depressed, Hopeless 0 0 0 1 3  PHQ - 2 Score 0 0 0 2 6  Altered sleeping 0  1 3 3   Tired, decreased energy 0  0 3 3  Change in appetite 0  0 3 3  Feeling bad or failure about yourself  0  0 1 3  Trouble concentrating 0  2 3 3   Moving slowly or fidgety/restless 0  0 0 2  Suicidal thoughts 0  0 0 0  PHQ-9 Score 0  3  15  23    Difficult doing work/chores     Very difficult     Data saved with a previous flowsheet row definition        04/30/2024    2:27 PM 12/19/2023   11:40 AM 09/27/2023    9:40 AM 06/21/2022    3:19 PM  GAD 7 : Generalized Anxiety Score  Nervous, Anxious, on Edge 1 1 2  0  Control/stop worrying 1 1 2  0  Worry too much - different things 1 0 3 0  Trouble relaxing 1 0 1 0  Restless 1 0 2 0  Easily annoyed or irritable 1 1 3  0  Afraid - awful might happen 0 0 1 0  Total GAD 7 Score 6 3 14  0  Anxiety Difficulty  Not difficult at all       Review of Systems:   Pertinent items are noted in HPI Denies any bowel or bladder changes.  Denies pelvic pain.   Reviewed past medical,surgical, social and family history.  Reviewed problem list, medications and allergies. Physical Assessment:   Vitals:    04/30/24 1418  BP: 126/83  Pulse: (!) 114  SpO2: 99%  Weight: (!) 315 lb (142.9 kg)  Height: 5' 7 (1.702 m)  Body mass index is 49.34 kg/m.        Physical Examination:   General appearance - well appearing, and in no distress  Mental status - alert, oriented to person, place, and time  Psych:  She has a normal mood and affect  Skin - warm and dry, normal color, no suspicious lesions noted  Chest - effort normal, all lung fields clear to auscultation bilaterally  Heart - normal rate and regular rhythm  Neck:  midline trachea, no thyromegaly or nodules  Breasts - breasts appear normal, no suspicious masses, no skin or nipple changes or  axillary nodes  Abdomen - soft, nontender, nondistended, no masses or organomegaly  Pelvic - VULVA: normal appearing vulva with no masses, tenderness or lesions   VAGINA: normal appearing vagina with normal color and discharge, no lesions  CERVIX: normal appearing cervix without discharge or lesions, no CMT  Thin prep pap is updated HR HPV cotesting  UTERUS: uterus is felt to be normal size, shape, consistency and nontender   ADNEXA: No adnexal masses or tenderness noted.  Rectal - no skin lesion  Extremities:  No swelling or varicosities noted  Chaperone present for exam  No results found for this or any previous visit (from the past 24 hours).  Assessment & Plan:  1. Well woman exam with routine gynecological exam (Primary) - Pap smear updated today - Mammogram discussed per above - Colonoscopy guidelines reviewed - lab work done with PCP - vaccines reviewed/updated  2. Cervical cancer screening - Cytology - PAP( [facility])  3. Herpes simplex virus (HSV) infection - valACYclovir  (VALTREX ) 500 MG tablet; Take 1 tablet (500 mg total) by mouth daily.  Dispense: 90 tablet; Refill: 3  4. Family history of breast cancer in mother - guidelines for screening discussed with her increased risk for breast.  Will consider either mmg with MRI  every 6 months or contrast enhanced mammogram yearly.  Will reassess closer to age 72.     No orders of the defined types were placed in this encounter.   Meds:  Meds ordered this encounter  Medications   valACYclovir  (VALTREX ) 500 MG tablet    Sig: Take 1 tablet (500 mg total) by mouth daily.    Dispense:  90 tablet    Refill:  3    Follow-up: Return in about 1 year (around 04/30/2025).  Ronal GORMAN [place], MD 05/01/2024 6:36 AM

## 2024-05-02 LAB — CYTOLOGY - PAP
Adequacy: ABSENT
Comment: NEGATIVE
Diagnosis: NEGATIVE
High risk HPV: NEGATIVE

## 2024-05-04 ENCOUNTER — Ambulatory Visit (HOSPITAL_BASED_OUTPATIENT_CLINIC_OR_DEPARTMENT_OTHER): Payer: Self-pay | Admitting: Obstetrics & Gynecology

## 2024-05-14 NOTE — Progress Notes (Signed)
 BH MD Outpatient Progress Note  05/19/2024 3:27 PM Deborah Little  MRN:  [id number]  Televisit via video: I connected with patient on 05/19/24 at  3:00 PM EST by a video enabled telemedicine application and verified that I am speaking with the correct person using two identifiers.  Location: Patient: in car in [place] (not driving) Provider: Office within    I discussed the limitations of evaluation and management by telemedicine and the availability of in person appointments. The patient expressed understanding and agreed to proceed.  I discussed the assessment and treatment plan with the patient. The patient was provided an opportunity to ask questions and all were answered. The patient agreed with the plan and demonstrated an understanding of the instructions.   The patient was advised to call back or seek an in-person evaluation if the symptoms worsen or if the condition fails to improve as anticipated.  Assessment:  Deborah Little presents for follow-up evaluation. Today, 05/19/24, patient reports worsening of her mood in the setting of being close to the holidays and increased conflict with her supervisors, family, and former partner. She has also had medication non-adherence due to difficulty with refills of 30 day prescriptions and reports some antidepressant withdrawal symptoms of brain zaps and dizziness. Given her increase in irritability and depressed mood, we discussed restarting cymbalta  at 30mg  for 1 week and then increasing to 60mg . She denied side effects from the cymbalta  previously and most recent blood pressure was within normal limits. She would benefit from therapy and continued to encourage though patient has preference for family therapy with her mom.  Identifying Information: Deborah Little is a 38 y.o. female with a history of depression, prediabetes, osteoarthritis, fibroids, iron  deficiency anemia, and Vitamin D  deficiency who is an established patient with [facility] for management of depression.   Plan:  # MDD, recurrent, moderate Past medication trials: Lamictal (tearfulness), Depakote (more sad), Abilify (jittery, fatigued), Vraylar (can't remember - may not have taken), Xanax, Lyrica , Atarax  Status of problem: ongoing Interventions: -- Continue cymbalta  30mg  daily x1 week then increase to 60mg   -- Stop trazodone  50mg  at bedtime PRN for insomnia  -- Continue Atarax 25 mg BID PRN anxiety/sleep  -- encourage engagement with therapy -- Labs reviewed             -- CBC and iron  panel 10/29/23 wnl       -Vit D, B12, and TSH wnl.   # Alcohol use  #Cannabis use disorder Past medication trials: none  Status of problem: ongoing Interventions: -- Provided positive encouragement regarding continued cessation from cannabis -- Patient reports social alcohol use  Patient was given contact information for behavioral health clinic and was instructed to call 911 for emergencies.   Future Appointments  Date Time Provider Department Center  12/09/2024  3:00 PM Alm Delon SAILOR, DO [facility] None    Subjective:  Chief Complaint: I have been doing good  Interval History:  --dx with carpal tunnel syndrome for her hand  -BP wnl at last OBGYN visit 04/30/2024  Patient reports mood is tired. She reports some increased irritability for 2 days before Thanksgiving. She is partying more. She reports that she has been feeling the blues, feeling more tired and irritable for the past month. She reports she has been feeling increased depressed and irritability. She reports she feels that she is still coping with life, has been increased over the past month. Patient reports getting intermittent sleep. Patient reports  fair appetite. She reports she is not taking the hydroxyzine. She reports she has not used cannabis, reports she has been drinking socially with the holidays. She denies SI/HI/AVH. Patient reports stressors include grief during  the holidays, work, triggered by other people including her mom and not being willing to talk through past issues, issues with supervisor at work, got a negative comment at work. She reports she told her supervisor at work and then they told the supervisor at work.  Patient reports nonadherence with medications. Patient reports some dizziness and brain zaps as a side effect to stopping her medication for the past week. Discussed importance of restarting her medications and starting at 30mg  then increasing to 60mg .  Patient denies SI/HI/AVH.   Visit Diagnosis:    ICD-10-CM   1. Major depressive disorder, recurrent episode, moderate (HCC)  F33.1     2. Cannabis use disorder, mild, in early remission  F12.11     3. At risk alcohol consumption  Z91.89        Past Psychiatric History:  Diagnoses: MDD vs. Bipolar disorder, cannabis use, PTSD, anxiety  Medication trials:   Current medication: cymbalta  20mg    Past medications: tramadol , modafinil , lyrica , buspar, celexa , atarax. Lamictal (tearfulness), Depakote (more sad), Abilify (jittery, fatigued), Vraylar (can't remember - may not have taken), Xanax, Lyrica , Atarax  Previous psychiatrist/therapist: Dr. Mercy initial, seen at [facility] ago; currently seeing therapist through Ob/gyn  Hospitalizations: denies, went to [facility] ED 07/2023 for increased depressive symptoms.  Suicide attempts: denies SIB: denies Hx of violence towards others: denies Current access to guns:  yes - counseled on proper storage and safety; consideration of removal if depressive symptoms worsen (denies active SI or HI)  Hx of trauma/abuse: denies Substance use:   --Edibles: 300 mg a few times per month; denies other forms of CBD/THC             -- Denies use of stimulants, opioids, BZDs, hallucinogens             -- Denies use of tobacco             -- Etoh: every other day - 3-4 drinks at a time                         -- Denies history of withdrawal  Past  Medical History:  Past Medical History:  Diagnosis Date   Anemia    Anxiety    Bilateral swelling of feet and ankles    Biliary dyskinesia 02/2018   Chest pain    Depression    Fatty liver    Fish allergy     Gallbladder problem    Hyperlipidemia    Joint pain    Obesity    Osteoarthritis    Other fatigue    Prediabetes    Shortness of breath    Shortness of breath on exertion    Sleep apnea    Swallowing difficulty    Vitamin D  deficiency   -- Home sleep study March 2023: only isolated apneic events; recommended to avoid sleeping in supine position; weight loss; sleep hygiene    Past Surgical History:  Procedure Laterality Date   CHOLECYSTECTOMY N/A 03/13/2018   Procedure: LAPAROSCOPIC CHOLECYSTECTOMY;  Surgeon: Vernetta Berg, MD;  Location: [facility];  Service: General;  Laterality: N/A;   GALLBLADDER SURGERY  2018   IR EMBO TUMOR ORGAN ISCHEMIA INFARCT INC GUIDE ROADMAPPING  01/16/2023   IR FLUORO  GUIDED NEEDLE PLC ASPIRATION/INJECTION LOC  01/16/2023   IR RADIOLOGIST EVAL & MGMT  12/05/2022   IR RADIOLOGIST EVAL & MGMT  02/01/2023   WISDOM TOOTH EXTRACTION      Family Psychiatric History:  Dad: functional alcoholic Mom: anxiety Sister's son: bipolar disorder  Family History:  Family History  Problem Relation Age of Onset   Obesity Mother    Drug abuse Mother    Sleep apnea Mother    Anxiety disorder Mother    Depression Mother    Hypertension Mother    Breast cancer Mother 66       ATM+   Obesity Father    Drug abuse Father    Alcoholism Father    Hypertension Father    Swallowing difficulties Brother    Lung cancer Maternal Aunt 76   Colon cancer Paternal Aunt 38 - 33   Stroke Maternal Grandmother    Pancreatic cancer Maternal Great-grandmother 34 - 18   Depression Other    Anxiety disorder Other    Sleep apnea Other    Alcoholism Other    Drug abuse Other    Obesity Other    Bipolar disorder Nephew     Social History:   Academic/Vocational: works part time at [employer]. 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   --Considers self a religious person --Has a 14 year old son [place]  Social History   Socioeconomic History   Marital status: Single    Spouse name: Not on file   Number of children: 1   Years of education: Not on file   Highest education level: Some college, no degree  Occupational History   Occupation: SOCIAL SERVICES    Employer: [employer]  Tobacco Use   Smoking status: Never   Smokeless tobacco: Never  Vaping Use   Vaping status: Never Used  Substance and Sexual Activity   Alcohol use: Not Currently   Drug use: Yes    Types: Marijuana   Sexual activity: Not Currently    Partners: Male  Other Topics Concern   Not on file  Social History Narrative   Lives at home with child   Right handed   Caffeine: 1 cup of coffee a day   Social Drivers of Corporate Investment Banker Strain: Low Risk  (04/17/2024)   Received from [facility]   Overall Financial Resource Strain (CARDIA)    How hard is it for you to pay for the very basics like food, housing, medical care, and heating?: Not very hard  Food Insecurity: No Food Insecurity (04/17/2024)   Received from [facility]   Hunger Vital Sign    Within the past 12 months, you worried that your food would run out before you got the money to buy more.: Never true    Within the past 12 months, the food you bought just didn't last and you didn't have money to get more.: Never true  Transportation Needs: No Transportation Needs (04/17/2024)   Received from [facility] - Transportation    In the past 12 months, has lack of transportation kept you from medical appointments or from getting medications?: No    In the past 12 months, has lack of transportation kept you from meetings, work, or from getting things needed for daily living?: No  Physical Activity: Inactive (04/17/2024)   Received from [facility]   Exercise Vital  Sign    On average, how many days per week do you engage in moderate  to strenuous exercise (like a brisk walk)?: 0 days    Minutes of Exercise per Session: Not on file  Stress: No Stress Concern Present (04/17/2024)   Received from [facility] - Occupational Stress Questionnaire    Do you feel stress - tense, restless, nervous, or anxious, or unable to sleep at night because your mind is troubled all the time - these days?: Not at all  Social Connections: Socially Integrated (04/17/2024)   Received from [facility]   Social Network    How would you rate your social network (family, work, friends)?: Good participation with social networks    Allergies:  Allergies  Allergen Reactions   Fish Allergy  Shortness Of Breath    Current Medications: Current Outpatient Medications  Medication Sig Dispense Refill   DULoxetine  (CYMBALTA ) 60 MG capsule Take 1 capsule (60 mg total) by mouth daily. 90 capsule 0   cholecalciferol (VITAMIN D3) 25 MCG (1000 UNIT) tablet Take 1,000 Units by mouth daily.     DULoxetine  (CYMBALTA ) 30 MG capsule Take 1 capsule (30 mg total) by mouth daily. 30 capsule 2   hydrOXYzine (ATARAX) 25 MG tablet Take 25 mg by mouth 2 (two) times daily as needed for anxiety (sleep).     traZODone  (DESYREL ) 50 MG tablet Take 1 tablet (50 mg total) by mouth at bedtime. 30 tablet 1   valACYclovir  (VALTREX ) 500 MG tablet Take 1 tablet (500 mg total) by mouth daily. 90 tablet 3   No current facility-administered medications for this visit.   Facility-Administered Medications Ordered in Other Visits  Medication Dose Route Frequency Provider Last Rate Last Admin   acetaminophen  (TYLENOL ) tablet 650 mg  650 mg Oral Once Cleotilde Ronal RAMAN, MD       diphenhydrAMINE  (BENADRYL ) capsule 25 mg  25 mg Oral Once Cleotilde Ronal RAMAN, MD       iron  sucrose (VENOFER ) injection 200 mg  200 mg Intravenous Once Cleotilde Ronal RAMAN, MD       sodium chloride  0.9 %  bolus 250 mL  250 mL Intravenous Once Cleotilde Ronal RAMAN, MD        ROS: Review of Systems  Constitutional:  Negative for appetite change and unexpected weight change.  Respiratory:  Negative for shortness of breath.   Cardiovascular:  Negative for chest pain.  Musculoskeletal:  Positive for arthralgias.   Objective:  Psychiatric Specialty Exam: There were no vitals taken for this visit.There is no height or weight on file to calculate BMI.  General Appearance: Fairly Groomed   Eye Contact:  Fair  Speech:  Clear and Coherent and Normal Rate  Volume:  Normal  Mood:  more irritable  Affect:  Appropriate  Thought Content: WDL   Suicidal Thoughts:  No  Homicidal Thoughts:  No  Thought Process:  Coherent  Orientation:  Full (Time, Place, and Person)    Memory: Grossly intact   Judgment:  Intact  Insight:  Fair  Concentration:  Concentration: Fair  Recall: not formally assessed   Fund of Knowledge: Fair  Language: Good  Psychomotor Activity:  Normal  Akathisia:  No  AIMS (if indicated): not done  Assets:  Communication Skills Desire for Improvement Housing Resilience Social Support Vocational/Educational  ADL's:  Intact  Cognition: WNL  Sleep:  Poor   PE: General: sits comfortably in view of camera; no acute distress  Pulm: no increased work of breathing on room air  MSK: all extremity movements appear intact  Neuro:  no focal neurological deficits observed  Gait & Station: unable to assess by video     Metabolic Disorder Labs: Lab Results  Component Value Date   HGBA1C 5.7 (H) 07/03/2023   MPG 114 02/01/2016   MPG 123 (H) 07/09/2014   No results found for: PROLACTIN Lab Results  Component Value Date   CHOL 159 02/01/2016   TRIG 80 02/01/2016   HDL 35 (L) 02/01/2016   CHOLHDL 4.5 02/01/2016   VLDL 16 02/01/2016   LDLCALC 108 02/01/2016   LDLCALC 108 (H) 07/09/2014   Lab Results  Component Value Date   TSH 1.380 12/26/2023   TSH 0.93 02/26/2018     Therapeutic Level Labs: No results found for: LITHIUM No results found for: VALPROATE No results found for: CBMZ  Screenings:  GAD-7    Flowsheet Row Office Visit from 04/30/2024 in [facility] for [employer] [facility] [employer] Office Visit from 12/19/2023 in [facility] for [facility] at [employer] [facility] from 09/27/2023 in Center for [facility] at [facility] Office Visit from 06/21/2022 in [place] for [facility] at [facility] Office Visit from 04/27/2022 in Center for [facility] at [facility]  Total GAD-7 Score 6 3 14  0 0   PHQ2-9    Flowsheet Row Office Visit from 04/30/2024 in [facility] for [facility] at [place] Office Visit from 12/19/2023 in [facility] for [facility] at [employer] [facility] from 09/27/2023 in Center for [facility] at [facility] Clinical Support from 07/31/2023 in [facility] at [employer] [facility] Office Visit from 07/03/2023 in [facility] for [facility] at [facility]  PHQ-2 Total Score 0 0 2 6 0  PHQ-9 Total Score 0 3 15 23  --   Flowsheet Row ED from 08/02/2023 in [facility] ED from 03/28/2023 in [facility] at [facility] ED from 03/22/2023 in [facility] Department at [facility]  C-SSRS RISK CATEGORY No Risk No Risk No Risk    Collaboration of Care: Collaboration of Care: Attending psychiatrist  Patient/Guardian was advised Release of Information must be obtained prior to any record release in order to collaborate their care with an outside provider. Patient/Guardian was advised if they have not already done so to contact the registration department to sign all necessary forms in order for us  to release information  regarding their care.   Consent: Patient/Guardian gives verbal consent for treatment and assignment of benefits for services provided during this visit. Patient/Guardian expressed understanding and agreed to proceed.   Corean Minor, MD, PGY-3 05/19/2024, 3:27 PM

## 2024-05-19 ENCOUNTER — Telehealth (HOSPITAL_COMMUNITY): Admitting: Psychiatry

## 2024-05-19 DIAGNOSIS — F1211 Cannabis abuse, in remission: Secondary | ICD-10-CM | POA: Diagnosis not present

## 2024-05-19 DIAGNOSIS — Z9189 Other specified personal risk factors, not elsewhere classified: Secondary | ICD-10-CM

## 2024-05-19 DIAGNOSIS — F331 Major depressive disorder, recurrent, moderate: Secondary | ICD-10-CM | POA: Diagnosis not present

## 2024-05-19 MED ORDER — DULOXETINE HCL 60 MG PO CPEP: 60.0000 mg | ORAL_CAPSULE | Freq: Every day | ORAL | 0 refills | Status: AC

## 2024-05-19 NOTE — Addendum Note (Signed)
 Addended by: CARVIN CROCK on: 05/19/2024 04:49 PM   Modules accepted: Level of Service

## 2024-05-30 ENCOUNTER — Encounter (HOSPITAL_COMMUNITY): Payer: Self-pay

## 2024-06-05 ENCOUNTER — Encounter (HOSPITAL_BASED_OUTPATIENT_CLINIC_OR_DEPARTMENT_OTHER): Payer: Self-pay | Admitting: Obstetrics & Gynecology

## 2024-06-06 ENCOUNTER — Other Ambulatory Visit (HOSPITAL_COMMUNITY)
Admission: RE | Admit: 2024-06-06 | Discharge: 2024-06-06 | Disposition: A | Discharge status: Home / Self Care | Source: Ambulatory Visit | Attending: Obstetrics & Gynecology | Admitting: Obstetrics & Gynecology

## 2024-06-06 ENCOUNTER — Encounter (HOSPITAL_BASED_OUTPATIENT_CLINIC_OR_DEPARTMENT_OTHER): Payer: Self-pay

## 2024-06-06 ENCOUNTER — Ambulatory Visit (INDEPENDENT_AMBULATORY_CARE_PROVIDER_SITE_OTHER): Payer: Self-pay

## 2024-06-06 VITALS — BP 125/88 | HR 88 | Wt 320.2 lb

## 2024-06-06 DIAGNOSIS — N760 Acute vaginitis: Secondary | ICD-10-CM

## 2024-06-06 DIAGNOSIS — L292 Pruritus vulvae: Secondary | ICD-10-CM

## 2024-06-06 DIAGNOSIS — N898 Other specified noninflammatory disorders of vagina: Secondary | ICD-10-CM | POA: Diagnosis present

## 2024-06-06 MED ORDER — METRONIDAZOLE 500 MG PO TABS: 500.0000 mg | ORAL_TABLET | Freq: Two times a day (BID) | ORAL | 0 refills | Status: AC

## 2024-06-06 NOTE — Progress Notes (Signed)
 NURSE VISIT- VAGINITIS/STD/POC  SUBJECTIVE:  Deborah Little is a 38 y.o. G1P1001 GYN patientfemale here for a vaginal swab for vaginitis screening, STD screen.  She reports the following symptoms: discharge described as clear, malodorous, and milky, odor, and vulvar itching for 3 days. Denies abnormal vaginal bleeding, significant pelvic pain, fever, or UTI symptoms.  OBJECTIVE:  BP 125/88 (BP Location: Left Arm, Patient Position: Sitting, Cuff Size: Large)   Pulse 88   Wt (!) 320 lb 3.2 oz (145.2 kg)   LMP  (LMP Unknown)   SpO2 99%   BMI 50.15 kg/m   Appears well, in no apparent distress  ASSESSMENT: Vaginal swab for vaginitis screening and STD screeing.  PLAN: Self-collected vaginal probe for Gonorrhea, Chlamydia, Trichomonas, Bacterial Vaginosis, Yeast sent to lab Treatment: Flagyl  500 mg BID x7 days sent to pharmacy on file. Follow-up as needed if symptoms persist/worsen, or new symptoms develop

## 2024-06-09 LAB — CERVICOVAGINAL ANCILLARY ONLY
Bacterial Vaginitis (gardnerella): POSITIVE — AB
Candida Glabrata: NEGATIVE
Candida Vaginitis: NEGATIVE
Chlamydia: NEGATIVE
Comment: NEGATIVE
Comment: NEGATIVE
Comment: NEGATIVE
Comment: NEGATIVE
Comment: NEGATIVE
Comment: NORMAL
Neisseria Gonorrhea: NEGATIVE
Trichomonas: POSITIVE — AB

## 2024-06-10 ENCOUNTER — Ambulatory Visit (HOSPITAL_BASED_OUTPATIENT_CLINIC_OR_DEPARTMENT_OTHER): Payer: Self-pay

## 2024-06-13 ENCOUNTER — Other Ambulatory Visit (HOSPITAL_COMMUNITY): Payer: Self-pay | Admitting: Psychiatry

## 2024-06-13 DIAGNOSIS — F3341 Major depressive disorder, recurrent, in partial remission: Secondary | ICD-10-CM

## 2024-06-13 NOTE — Telephone Encounter (Signed)
 Decline cymbalta  30mg  due to dose change to 60mg .

## 2024-06-20 ENCOUNTER — Telehealth (HOSPITAL_COMMUNITY): Payer: Self-pay

## 2024-06-20 NOTE — Telephone Encounter (Signed)
 Patient is calling because she is having some side effects of increasing her medication. She would like to speak with you please.

## 2024-06-30 NOTE — Progress Notes (Unsigned)
 ERRONEOUS ENCOUNTER  Sent 3 reminders to patient's phone regarding scheduled virtual appointment today at 3pm, no response. Stayed on video appointment until 3:10pm.    Corean Minor, MD, PGY-3 06/30/2024, 4:49 PM

## 2024-07-02 ENCOUNTER — Encounter (HOSPITAL_COMMUNITY): Admitting: Psychiatry

## 2024-07-02 DIAGNOSIS — Z9189 Other specified personal risk factors, not elsewhere classified: Secondary | ICD-10-CM

## 2024-07-02 DIAGNOSIS — F1211 Cannabis abuse, in remission: Secondary | ICD-10-CM

## 2024-07-02 DIAGNOSIS — F331 Major depressive disorder, recurrent, moderate: Secondary | ICD-10-CM

## 2024-12-09 ENCOUNTER — Ambulatory Visit: Payer: Self-pay | Admitting: Dermatology
